# Patient Record
Sex: Female | Born: 1935 | Race: White | Hispanic: No
Health system: Midwestern US, Community
[De-identification: ages and names within clinical notes are randomized; demographics above are authoritative.]

## PROBLEM LIST (undated history)

## (undated) DIAGNOSIS — R519 Headache, unspecified: Secondary | ICD-10-CM

## (undated) DIAGNOSIS — W19XXXA Unspecified fall, initial encounter: Secondary | ICD-10-CM

## (undated) DIAGNOSIS — E7800 Pure hypercholesterolemia, unspecified: Secondary | ICD-10-CM

## (undated) DIAGNOSIS — K219 Gastro-esophageal reflux disease without esophagitis: Secondary | ICD-10-CM

## (undated) DIAGNOSIS — H3530 Unspecified macular degeneration: Secondary | ICD-10-CM

## (undated) DIAGNOSIS — I10 Essential (primary) hypertension: Secondary | ICD-10-CM

## (undated) DIAGNOSIS — R296 Repeated falls: Secondary | ICD-10-CM

## (undated) DIAGNOSIS — M1990 Unspecified osteoarthritis, unspecified site: Secondary | ICD-10-CM

## (undated) DIAGNOSIS — M549 Dorsalgia, unspecified: Secondary | ICD-10-CM

## (undated) DIAGNOSIS — M810 Age-related osteoporosis without current pathological fracture: Secondary | ICD-10-CM

## (undated) DIAGNOSIS — M47812 Spondylosis without myelopathy or radiculopathy, cervical region: Secondary | ICD-10-CM

## (undated) DIAGNOSIS — I350 Nonrheumatic aortic (valve) stenosis: Secondary | ICD-10-CM

## (undated) DIAGNOSIS — R1310 Dysphagia, unspecified: Secondary | ICD-10-CM

## (undated) HISTORY — DX: Unspecified osteoarthritis, unspecified site: M19.90

## (undated) HISTORY — PX: BREAST SURGERY: SHX581

## (undated) HISTORY — DX: Gastro-esophageal reflux disease without esophagitis: K21.9

## (undated) HISTORY — PX: CHOLECYSTECTOMY: SHX55

## (undated) HISTORY — PX: BACK SURGERY: SHX140

## (undated) HISTORY — DX: Age-related osteoporosis without current pathological fracture: M81.0

## (undated) HISTORY — DX: Spondylosis without myelopathy or radiculopathy, cervical region: M47.812

---

## 2011-10-19 ENCOUNTER — Other Ambulatory Visit: Payer: Self-pay | Admitting: Family Medicine

## 2011-10-24 ENCOUNTER — Ambulatory Visit
Admission: RE | Admit: 2011-10-24 | Discharge: 2011-10-24 | Disposition: A | Discharge status: Home / Self Care | Payer: Medicare Other | Source: Ambulatory Visit | Attending: Family Medicine | Admitting: Family Medicine

## 2011-10-24 MED ORDER — IOHEXOL 300 MG/ML  SOLN
100.0000 mL | Freq: Once | INTRAMUSCULAR | Status: AC | PRN
  Administered 2011-10-24: 100 mL via INTRAVENOUS

## 2011-11-06 ENCOUNTER — Emergency Department (HOSPITAL_BASED_OUTPATIENT_CLINIC_OR_DEPARTMENT_OTHER): Payer: Medicare Other

## 2011-11-06 ENCOUNTER — Emergency Department (HOSPITAL_BASED_OUTPATIENT_CLINIC_OR_DEPARTMENT_OTHER)
Admission: EM | Admit: 2011-11-06 | Discharge: 2011-11-06 | Disposition: A | Discharge status: Home / Self Care | Payer: Medicare Other | Attending: Emergency Medicine | Admitting: Emergency Medicine

## 2011-11-06 ENCOUNTER — Encounter (HOSPITAL_BASED_OUTPATIENT_CLINIC_OR_DEPARTMENT_OTHER): Payer: Self-pay | Admitting: Family Medicine

## 2011-11-06 DIAGNOSIS — Z9089 Acquired absence of other organs: Secondary | ICD-10-CM | POA: Insufficient documentation

## 2011-11-06 DIAGNOSIS — Z79899 Other long term (current) drug therapy: Secondary | ICD-10-CM | POA: Insufficient documentation

## 2011-11-06 DIAGNOSIS — R1031 Right lower quadrant pain: Secondary | ICD-10-CM | POA: Insufficient documentation

## 2011-11-06 DIAGNOSIS — K5732 Diverticulitis of large intestine without perforation or abscess without bleeding: Secondary | ICD-10-CM | POA: Insufficient documentation

## 2011-11-06 DIAGNOSIS — I10 Essential (primary) hypertension: Secondary | ICD-10-CM | POA: Insufficient documentation

## 2011-11-06 DIAGNOSIS — K5792 Diverticulitis of intestine, part unspecified, without perforation or abscess without bleeding: Secondary | ICD-10-CM

## 2011-11-06 DIAGNOSIS — E7800 Pure hypercholesterolemia, unspecified: Secondary | ICD-10-CM | POA: Insufficient documentation

## 2011-11-06 HISTORY — DX: Dorsalgia, unspecified: M54.9

## 2011-11-06 HISTORY — DX: Unspecified macular degeneration: H35.30

## 2011-11-06 HISTORY — DX: Pure hypercholesterolemia, unspecified: E78.00

## 2011-11-06 HISTORY — DX: Essential (primary) hypertension: I10

## 2011-11-06 LAB — COMPREHENSIVE METABOLIC PANEL
Albumin: 3.6 g/dL (ref 3.5–5.2)
BUN: 13 mg/dL (ref 6–23)
Calcium: 9.4 mg/dL (ref 8.4–10.5)
Creatinine, Ser: 0.8 mg/dL (ref 0.50–1.10)
Total Bilirubin: 0.6 mg/dL (ref 0.3–1.2)
Total Protein: 7.1 g/dL (ref 6.0–8.3)

## 2011-11-06 LAB — DIFFERENTIAL
Basophils Relative: 0 % (ref 0–1)
Eosinophils Absolute: 0 10*3/uL (ref 0.0–0.7)
Eosinophils Relative: 0 % (ref 0–5)
Monocytes Absolute: 1.2 10*3/uL — ABNORMAL HIGH (ref 0.1–1.0)
Monocytes Relative: 8 % (ref 3–12)

## 2011-11-06 LAB — CBC
HCT: 39.7 % (ref 36.0–46.0)
Hemoglobin: 13.5 g/dL (ref 12.0–15.0)
MCH: 31 pg (ref 26.0–34.0)
MCHC: 34 g/dL (ref 30.0–36.0)

## 2011-11-06 MED ORDER — SODIUM CHLORIDE 0.9 % IV BOLUS (SEPSIS)
500.0000 mL | Freq: Once | INTRAVENOUS | Status: AC
  Administered 2011-11-06: 1000 mL via INTRAVENOUS

## 2011-11-06 MED ORDER — CIPROFLOXACIN HCL 500 MG PO TABS: 500.0000 mg | ORAL_TABLET | Freq: Two times a day (BID) | ORAL | Status: AC

## 2011-11-06 MED ORDER — SODIUM CHLORIDE 0.9 % IV SOLN: INTRAVENOUS | Status: DC

## 2011-11-06 MED ORDER — OXYCODONE-ACETAMINOPHEN 5-325 MG PO TABS: 2.0000 | ORAL_TABLET | Freq: Four times a day (QID) | ORAL | Status: AC | PRN

## 2011-11-06 MED ORDER — METRONIDAZOLE 500 MG PO TABS: 500.0000 mg | ORAL_TABLET | Freq: Two times a day (BID) | ORAL | Status: AC

## 2011-11-06 MED ORDER — HYDROMORPHONE HCL PF 1 MG/ML IJ SOLN
0.5000 mg | Freq: Once | INTRAMUSCULAR | Status: AC
  Administered 2011-11-06: 0.5 mg via INTRAVENOUS
  Filled 2011-11-06: qty 1

## 2011-11-06 NOTE — ED Notes (Signed)
Pt c/o RLQ pain intermittent for 2 weeks and constant today. Pt was seen by Dr. Hyacinth Meeker, PCP for same and had negative CT scan. Pt went to Urgent care today and had negative urinalysis but elevated WBC. Pt sts last bowel movement 2 days ago, denies nausea, vomiting.

## 2011-11-06 NOTE — ED Provider Notes (Signed)
History     CSN: [id number]  Arrival date & time 11/06/11  1204   First MD Initiated Contact with Patient 11/06/11 1359      Chief Complaint  Patient presents with  . Abdominal Pain   PCP [facility] (Consider location/radiation/quality/duration/timing/severity/associated sxs/prior treatment) HPI This 78 year old female has had intermittent right lower quadrant abdominal pain lasting several minutes at a time over the last 2 weeks until today when she developed constant pain for the last several hours. She is no nausea vomiting or diarrhea. She last had a hard bowel movement 2 days ago. She has no chest pain cough shortness of breath or upper abdominal pain. She has no back pain. She is no radiation of the pain. The pain is constant today it is also coming in waves of colicky pain at times as well. It is worse with position changes as well. She has no pain in her extremities no weakness or numbness. She is no change in bowel or bladder function. She had a CT scan which is unremarkable according to the patient and family report about a week and a half ago. She was sent here today from an urgent care where a urinalysis was negative and she had a CBC showing an elevated white count of 13,000. She has a followup appointment tomorrow with her doctor. She has not eaten food today but did have a small amount of water this morning. Her pain is moderately severe there was no treatment of the pain prior to arrival. Past Medical History  Diagnosis Date  . Hypertension   . High cholesterol   . Back pain   . Macular degeneration     Past Surgical History  Procedure Date  . Cholecystectomy   . Back surgery   . Breast surgery     No family history on file.  History  Substance Use Topics  . Smoking status: Never Smoker   . Smokeless tobacco: Not on file  . Alcohol Use: No    OB History    Grav Para Term Preterm Abortions TAB SAB Ect Mult Living                  Review of  Systems  Constitutional: Negative for fever.       10 Systems reviewed and are negative for acute change except as noted in the HPI.  HENT: Negative for congestion.   Eyes: Negative for discharge and redness.  Respiratory: Negative for cough and shortness of breath.   Cardiovascular: Negative for chest pain.  Gastrointestinal: Positive for abdominal pain and constipation. Negative for nausea, vomiting, diarrhea and blood in stool.  Genitourinary: Negative for dysuria.  Musculoskeletal: Negative for back pain.  Skin: Negative for rash.  Neurological: Negative for syncope, numbness and headaches.  Psychiatric/Behavioral:       No behavior change.    Allergies  Codeine  Home Medications   Current Outpatient Rx  Name Route Sig Dispense Refill  . AMITRIPTYLINE HCL 50 MG PO TABS Oral Take 50 mg by mouth at bedtime.    Marland Kitchen AMLODIPINE BESYLATE 5 MG PO TABS Oral Take 5 mg by mouth daily.    . ATORVASTATIN CALCIUM 40 MG PO TABS Oral Take 40 mg by mouth daily.    Marland Kitchen LANSOPRAZOLE 30 MG PO CPDR Oral Take 30 mg by mouth daily.    Marland Kitchen LISINOPRIL 40 MG PO TABS Oral Take 40 mg by mouth daily.    Marland Kitchen CIPROFLOXACIN HCL 500 MG PO  TABS Oral Take 1 tablet (500 mg total) by mouth 2 (two) times daily. One po bid x 7 days 14 tablet 0  . METRONIDAZOLE 500 MG PO TABS Oral Take 1 tablet (500 mg total) by mouth 2 (two) times daily. One po bid x 7 days 14 tablet 0  . OXYCODONE-ACETAMINOPHEN 5-325 MG PO TABS Oral Take 2 tablets by mouth every 6 (six) hours as needed for pain. 20 tablet 0    BP 133/70  Pulse 98  Temp(Src) 98.2 F (36.8 C) (Oral)  Resp 16  Ht 5' (1.524 m)  Wt 123 lb (55.792 kg)  BMI 24.02 kg/m2  SpO2 100%  Physical Exam  Nursing note and vitals reviewed. Constitutional:       Awake, alert, nontoxic appearance.  HENT:  Head: Atraumatic.  Eyes: Right eye exhibits no discharge. Left eye exhibits no discharge.  Neck: Neck supple.  Cardiovascular: Normal rate and regular rhythm.   Murmur  heard. Pulmonary/Chest: Effort normal and breath sounds normal. No respiratory distress. She has no wheezes. She has no rales. She exhibits no tenderness.  Abdominal: Soft. Bowel sounds are normal. She exhibits no mass. There is tenderness. There is guarding. There is no rebound.       There is moderate right lower quadrant tenderness with questionable if any localized rebound tenderness without diffuse rebound or generalized peritoneal findings, there is no percussion tenderness even over the RLQ.  Musculoskeletal: She exhibits no edema and no tenderness.       Baseline ROM, no obvious new focal weakness. Both legs are nontender with normal light touch no edema she has dorsalis pedis pulses intact bilaterally with capillary refill less than 2 seconds in her toes normal light touch to her feet and good movement of her feet and legs.  Neurological:       Mental status and motor strength appears baseline for patient and situation.  Skin: No rash noted.  Psychiatric: She has a normal mood and affect.    ED Course  Procedures (including critical care time)  Labs Reviewed  CBC - Abnormal; Notable for the following:    WBC 14.4 (*)    All other components within normal limits  DIFFERENTIAL - Abnormal; Notable for the following:    Neutrophils Relative 85 (*)    Neutro Abs 12.3 (*)    Lymphocytes Relative 7 (*)    Monocytes Absolute 1.2 (*)    All other components within normal limits  COMPREHENSIVE METABOLIC PANEL - Abnormal; Notable for the following:    Glucose, Bld 115 (*)    GFR calc non Af Amer 62 (*)    GFR calc Af Amer 72 (*)    All other components within normal limits   No results found.   1. Diverticulitis       MDM   Pt stable in ED with no significant deterioration in condition.Patient / Family / Caregiver informed of clinical course, understand medical decision-making process, and agree with plan.I doubt any other EMC precluding discharge at this time including, but  not necessarily limited to the following:sepsis, peritonitis.       Hurman Horn, MD 11/13/11 2224

## 2011-11-06 NOTE — Discharge Instructions (Signed)
Diverticulitis Small pockets or "bubbles" can develop in the wall of the intestine. Diverticulitis is when those pockets become infected and inflamed. This causes stomach pain (usually on the left side). HOME CARE  Take all medicine as told by your doctor.   Try a clear liquid diet (broth, tea, or water) for as long as told by your doctor.   Keep all follow-up visits with your doctor.   You may be put on a low-fiber diet once you start feeling better. Here are foods that have low-fiber:   White breads, cereals, rice, and pasta.   Cooked fruits and vegetables or soft fresh fruits and vegetables without the skin.   Ground or well-cooked tender beef, ham, veal, lamb, pork, or poultry.   Eggs and seafood.   After you are doing well on the low-fiber diet, you may be put on a high-fiber diet. Here are ways to increase your fiber:   Choose whole-grain breads, cereals, pasta, and brown rice.   Choose fruits and vegetables with skin on. Do not overcook the vegetables.   Choose nuts, seeds, legumes, dried peas, beans, and lentils.   Look for food products that have more than 3 grams of fiber per serving on the food label.  GET HELP RIGHT AWAY IF:  Your pain does not get better or gets worse.   You have trouble eating food.   You are not pooping (having bowel movements) like normal.   You have a temperature by mouth above 101 F.  You keep throwing up (vomiting).   You have bloody or black, tarry poop (stools).  Return sooner also if you develop confusion, rash, shortness of breath, dizziness, severe uncontrolled pain, or other concerns.   You are getting worse and not better.  MAKE SURE YOU:   Understand these instructions.   Will watch your condition.   Will get help right away if you are not doing well or get worse.  Document Released: 11/22/2007 Document Revised: 05/25/2011 Document Reviewed: 04/26/2009 [facility] Patient Information 2012 [place], [place].

## 2012-03-12 ENCOUNTER — Ambulatory Visit: Payer: Medicare Other | Admitting: Occupational Therapy

## 2012-09-23 ENCOUNTER — Emergency Department (HOSPITAL_COMMUNITY): Payer: Medicare Other

## 2012-09-23 ENCOUNTER — Emergency Department (HOSPITAL_COMMUNITY)
Admission: EM | Admit: 2012-09-23 | Discharge: 2012-09-23 | Disposition: A | Discharge status: Home / Self Care | Payer: Medicare Other | Attending: Emergency Medicine | Admitting: Emergency Medicine

## 2012-09-23 DIAGNOSIS — E041 Nontoxic single thyroid nodule: Secondary | ICD-10-CM

## 2012-09-23 DIAGNOSIS — Z8739 Personal history of other diseases of the musculoskeletal system and connective tissue: Secondary | ICD-10-CM | POA: Insufficient documentation

## 2012-09-23 DIAGNOSIS — S41112A Laceration without foreign body of left upper arm, initial encounter: Secondary | ICD-10-CM

## 2012-09-23 DIAGNOSIS — R42 Dizziness and giddiness: Secondary | ICD-10-CM | POA: Insufficient documentation

## 2012-09-23 DIAGNOSIS — S41109A Unspecified open wound of unspecified upper arm, initial encounter: Secondary | ICD-10-CM | POA: Insufficient documentation

## 2012-09-23 DIAGNOSIS — I10 Essential (primary) hypertension: Secondary | ICD-10-CM | POA: Insufficient documentation

## 2012-09-23 DIAGNOSIS — Z8669 Personal history of other diseases of the nervous system and sense organs: Secondary | ICD-10-CM | POA: Insufficient documentation

## 2012-09-23 DIAGNOSIS — S51811D Laceration without foreign body of right forearm, subsequent encounter: Secondary | ICD-10-CM

## 2012-09-23 DIAGNOSIS — E7800 Pure hypercholesterolemia, unspecified: Secondary | ICD-10-CM | POA: Insufficient documentation

## 2012-09-23 DIAGNOSIS — R296 Repeated falls: Secondary | ICD-10-CM | POA: Insufficient documentation

## 2012-09-23 DIAGNOSIS — Z79899 Other long term (current) drug therapy: Secondary | ICD-10-CM | POA: Insufficient documentation

## 2012-09-23 DIAGNOSIS — Y9389 Activity, other specified: Secondary | ICD-10-CM | POA: Insufficient documentation

## 2012-09-23 DIAGNOSIS — S300XXA Contusion of lower back and pelvis, initial encounter: Secondary | ICD-10-CM

## 2012-09-23 DIAGNOSIS — S51809A Unspecified open wound of unspecified forearm, initial encounter: Secondary | ICD-10-CM | POA: Insufficient documentation

## 2012-09-23 DIAGNOSIS — W19XXXA Unspecified fall, initial encounter: Secondary | ICD-10-CM

## 2012-09-23 DIAGNOSIS — Y92009 Unspecified place in unspecified non-institutional (private) residence as the place of occurrence of the external cause: Secondary | ICD-10-CM | POA: Insufficient documentation

## 2012-09-23 DIAGNOSIS — Z23 Encounter for immunization: Secondary | ICD-10-CM | POA: Insufficient documentation

## 2012-09-23 DIAGNOSIS — Z9889 Other specified postprocedural states: Secondary | ICD-10-CM | POA: Insufficient documentation

## 2012-09-23 MED ORDER — TETANUS-DIPHTH-ACELL PERTUSSIS 5-2.5-18.5 LF-MCG/0.5 IM SUSP
0.5000 mL | Freq: Once | INTRAMUSCULAR | Status: AC
  Administered 2012-09-23: 0.5 mL via INTRAMUSCULAR
  Filled 2012-09-23: qty 0.5

## 2012-09-23 MED ORDER — OXYCODONE-ACETAMINOPHEN 5-325 MG PO TABS
1.0000 | ORAL_TABLET | Freq: Once | ORAL | Status: AC
  Administered 2012-09-23: 1 via ORAL
  Filled 2012-09-23: qty 1

## 2012-09-23 MED ORDER — OXYCODONE-ACETAMINOPHEN 5-325 MG PO TABS: 1.0000 | ORAL_TABLET | ORAL | Status: DC | PRN

## 2012-09-23 NOTE — ED Notes (Signed)
Pt still in radiology.

## 2012-09-23 NOTE — ED Provider Notes (Signed)
History     CSN: [id number]  Arrival date & time 09/23/12  1308   First MD Initiated Contact with Patient 09/23/12 1317      Chief Complaint  Patient presents with  . Fall    (Consider location/radiation/quality/duration/timing/severity/associated sxs/prior treatment) Patient is a 79 y.o. female presenting with fall. The history is provided by the patient.  Fall  She into her closet when she started to feel dizzy and fell. She has recently been treated for vertigo and was given medication to take on an as-needed basis. When she fell this time, she suffered lacerations to her right arm and bruised her tailbone. She denies loss of consciousness and denies headache. She rates the pain in her tailbone at 7/10. It is worse if she is sitting in certain positions. EMS placed her in a KED and brought her to the ED. She does not know when her last tetanus immunization was.  Past Medical History  Diagnosis Date  . Hypertension   . High cholesterol   . Back pain   . Macular degeneration     Past Surgical History  Procedure Laterality Date  . Cholecystectomy    . Back surgery    . Breast surgery      No family history on file.  History  Substance Use Topics  . Smoking status: Never Smoker   . Smokeless tobacco: Not on file  . Alcohol Use: No    OB History   Grav Para Term Preterm Abortions TAB SAB Ect Mult Living                  Review of Systems  All other systems reviewed and are negative.    Allergies  Codeine and Nsaids  Home Medications   Current Outpatient Rx  Name  Route  Sig  Dispense  Refill  . acetaminophen (TYLENOL) 650 MG CR tablet   Oral   Take 650 mg by mouth every 6 (six) hours as needed for pain.         Marland Kitchen alendronate (FOSAMAX) 70 MG tablet   Oral   Take 70 mg by mouth every 7 (seven) days. Take with a full glass of water on an empty stomach.         Marland Kitchen amitriptyline (ELAVIL) 50 MG tablet   Oral   Take 50 mg by mouth at bedtime.          Marland Kitchen amLODipine (NORVASC) 5 MG tablet   Oral   Take 5 mg by mouth daily.         Marland Kitchen atorvastatin (LIPITOR) 40 MG tablet   Oral   Take 40 mg by mouth daily.         . cholecalciferol (VITAMIN D) 1000 UNITS tablet   Oral   Take 1,000 Units by mouth daily.         . lansoprazole (PREVACID) 30 MG capsule   Oral   Take 30 mg by mouth daily.         Marland Kitchen lisinopril (PRINIVIL,ZESTRIL) 40 MG tablet   Oral   Take 40 mg by mouth daily.         . Multiple Vitamin (MULTIVITAMIN WITH MINERALS) TABS   Oral   Take 1 tablet by mouth daily.           BP 145/58  Pulse 101  Temp(Src) 98 F (36.7 C) (Oral)  Resp 18  SpO2 95%  Physical Exam  Nursing note and vitals reviewed.  79 year  old female, resting comfortably and in no acute distress. She is immobilized with a stiff cervical collar andKED.  Vital signs are significant for borderline tachycardia with heart rate 101, and mild hypertension with blood pressure 145/58. Oxygen saturation is 95%, which is normal. Head is normocephalic and atraumatic. PERRLA, EOMI. Oropharynx is clear. Neck is nontender without adenopathy or JVD. Backhas mild tenderness over the sacral area. No tenderness in the lumbar or thoracic regions. Lungs are clear without rales, wheezes, or rhonchi. Chest is nontender. Heart has regular rate and rhythm without murmur. Abdomen is soft, flat, nontender without masses or hepatosplenomegaly and peristalsis is normoactive. Extremities have no cyanosis or edema, full range of motion is present.Skin tears are present in the right upper arm and forearm.  Skin is warm and dry without other rash. Neurologic: Mental status is normal, cranial nerves are intact, there are no motor or sensory deficits.  ED Course  Procedures (including critical care time)  Labs Reviewed - No data to display Dg Pelvis 1-2 Views  09/23/2012  *RADIOLOGY REPORT*  Clinical Data: Larey Seat.  Pain.  PELVIS - 1-2 VIEW  Comparison:04/10/2012   Findings:  No evidence of pelvic fracture.  There is mild osteoarthritis of the hip joints.  Chronic benign-appearing sclerotic focus of the left iliac bone is unchanged.  There has been previous lumbar laminectomy.  IMPRESSION: No acute or traumatic finding.   Original Report Authenticated By: Paulina Fusi, M.D.    Dg Sacrum/coccyx  09/23/2012  *RADIOLOGY REPORT*  Clinical Data: Larey Seat.  Pain.  SACRUM AND COCCYX - 2+ VIEW  Comparison: CT 11/06/2011  Findings: No evidence of sacral or coccygeal fracture.  No other focal finding.  IMPRESSION: Negative radiographs   Original Report Authenticated By: Paulina Fusi, M.D.    Ct Head Wo Contrast  09/23/2012  *RADIOLOGY REPORT*  Clinical Data:  Status post fall.  CT HEAD WITHOUT CONTRAST CT CERVICAL SPINE WITHOUT CONTRAST  Technique:  Multidetector CT imaging of the head and cervical spine was performed following the standard protocol without intravenous contrast.  Multiplanar CT image reconstructions of the cervical spine were also generated.  Comparison:   None  CT HEAD  Findings: There is no evidence of acute intracranial hemorrhage, mass effect, brain edema or extra-axial fluid collection.  The ventricles and subarachnoid spaces are diffusely prominent consistent with mild atrophy.  No acute cortical infarction is seen.  There is moderate periventricular and subcortical white matter disease, likely chronic small vessel ischemic change.  The visualized paranasal sinuses are clear. The calvarium is intact.  IMPRESSION: No acute intracranial or calvarial findings.  Moderate periventricular and subcortical white matter disease, likely small vessel ischemic change.  CT CERVICAL SPINE  Findings: There is a convex left cervical scoliosis associated with multilevel spondylosis.  There is no evidence of acute fracture or traumatic subluxation.  Facet degenerative changes are present throughout the cervical spine.  No acute soft tissue findings are identified.  However, there is  an asymmetric right thyroid nodule measuring up to 4.3 x 3.0 cm transverse and 4.7 cm cephalocaudad.  This results in tracheal deviation to the left.  The degree of tracheal deviation is similar to chest radiographs obtained last year.  Scattered vascular calcifications are noted.  The lung apices are clear.  IMPRESSION:  1.  No evidence of acute cervical spine fracture, traumatic subluxation or static signs instability. 2.  Multilevel facet disease, primarily involving the facet joints. 3.  Right thyroid mass. If this is not a known clinical  finding, further evaluation with thyroid ultrasound should be considered. This may not be clinically significant in this 79 year old.   Original Report Authenticated By: Carey Bullocks, M.D.    Ct Cervical Spine Wo Contrast  09/23/2012  *RADIOLOGY REPORT*  Clinical Data:  Status post fall.  CT HEAD WITHOUT CONTRAST CT CERVICAL SPINE WITHOUT CONTRAST  Technique:  Multidetector CT imaging of the head and cervical spine was performed following the standard protocol without intravenous contrast.  Multiplanar CT image reconstructions of the cervical spine were also generated.  Comparison:   None  CT HEAD  Findings: There is no evidence of acute intracranial hemorrhage, mass effect, brain edema or extra-axial fluid collection.  The ventricles and subarachnoid spaces are diffusely prominent consistent with mild atrophy.  No acute cortical infarction is seen.  There is moderate periventricular and subcortical white matter disease, likely chronic small vessel ischemic change.  The visualized paranasal sinuses are clear. The calvarium is intact.  IMPRESSION: No acute intracranial or calvarial findings.  Moderate periventricular and subcortical white matter disease, likely small vessel ischemic change.  CT CERVICAL SPINE  Findings: There is a convex left cervical scoliosis associated with multilevel spondylosis.  There is no evidence of acute fracture or traumatic subluxation.  Facet  degenerative changes are present throughout the cervical spine.  No acute soft tissue findings are identified.  However, there is an asymmetric right thyroid nodule measuring up to 4.3 x 3.0 cm transverse and 4.7 cm cephalocaudad.  This results in tracheal deviation to the left.  The degree of tracheal deviation is similar to chest radiographs obtained last year.  Scattered vascular calcifications are noted.  The lung apices are clear.  IMPRESSION:  1.  No evidence of acute cervical spine fracture, traumatic subluxation or static signs instability. 2.  Multilevel facet disease, primarily involving the facet joints. 3.  Right thyroid mass. If this is not a known clinical finding, further evaluation with thyroid ultrasound should be considered. This may not be clinically significant in this 79 year old.   Original Report Authenticated By: Carey Bullocks, M.D.      1. Fall at home, initial encounter   2. Contusion of sacrococcygeal region, initial encounter   3. Skin tear of forearm without complication, right, subsequent encounter   4. Skin tear of upper arm without complication, left, initial encounter   5. Thyroid nodule       MDM  4 with injury to sacrum and coccyx region and skin tears. Underlying vertigo may contributed to the fall. She will be sent for CT of head and cervical spine as well as x-rays of her sacrum and coccyx. TdAP booster is given. Skin tears will need to be treated with simple dressing.  X-rays and CT scans are significant only for a thyroid nodule. I have explained this to the patient and she thinks that it has been in and evaluated in the past with decision made not to do any kind of intervention. She's advised to double check with her PCP to make sure that it has been adequately evaluated in the past. She is sent home with instructions on how to take care of skin tears and contusions and prescriptions given for Percocet for pain.     Dione Booze, MD 09/23/12 [phone]

## 2012-09-23 NOTE — ED Notes (Signed)
Per report from [facility] pt is a resident at [facility] independent living.  Pt was attempting to turn out of the closet and tripped.  The fall resulted in Low back pain.  CMS is intact.  KED and C-collar is in place.  No distress noted.  Resp symmetrical and unlabored.  Skin warm and dry.  A/ox 4 denies LOC.

## 2012-09-29 ENCOUNTER — Encounter (HOSPITAL_COMMUNITY): Payer: Self-pay | Admitting: Emergency Medicine

## 2012-09-29 ENCOUNTER — Emergency Department (HOSPITAL_COMMUNITY)
Admission: EM | Admit: 2012-09-29 | Discharge: 2012-09-30 | Disposition: A | Discharge status: Home / Self Care | Payer: Medicare Other | Attending: Emergency Medicine | Admitting: Emergency Medicine

## 2012-09-29 DIAGNOSIS — M533 Sacrococcygeal disorders, not elsewhere classified: Secondary | ICD-10-CM | POA: Insufficient documentation

## 2012-09-29 DIAGNOSIS — N949 Unspecified condition associated with female genital organs and menstrual cycle: Secondary | ICD-10-CM | POA: Insufficient documentation

## 2012-09-29 DIAGNOSIS — R0602 Shortness of breath: Secondary | ICD-10-CM | POA: Insufficient documentation

## 2012-09-29 DIAGNOSIS — Z79899 Other long term (current) drug therapy: Secondary | ICD-10-CM | POA: Insufficient documentation

## 2012-09-29 DIAGNOSIS — Z8739 Personal history of other diseases of the musculoskeletal system and connective tissue: Secondary | ICD-10-CM | POA: Insufficient documentation

## 2012-09-29 DIAGNOSIS — I10 Essential (primary) hypertension: Secondary | ICD-10-CM | POA: Insufficient documentation

## 2012-09-29 DIAGNOSIS — E7800 Pure hypercholesterolemia, unspecified: Secondary | ICD-10-CM | POA: Insufficient documentation

## 2012-09-29 DIAGNOSIS — H539 Unspecified visual disturbance: Secondary | ICD-10-CM | POA: Insufficient documentation

## 2012-09-29 DIAGNOSIS — K5900 Constipation, unspecified: Secondary | ICD-10-CM | POA: Insufficient documentation

## 2012-09-29 DIAGNOSIS — R110 Nausea: Secondary | ICD-10-CM | POA: Insufficient documentation

## 2012-09-29 DIAGNOSIS — M549 Dorsalgia, unspecified: Secondary | ICD-10-CM | POA: Insufficient documentation

## 2012-09-29 MED ORDER — ONDANSETRON 4 MG PO TBDP
4.0000 mg | ORAL_TABLET | Freq: Once | ORAL | Status: AC
  Administered 2012-09-29: 4 mg via ORAL
  Filled 2012-09-29: qty 1

## 2012-09-29 MED ORDER — HYDROCODONE-ACETAMINOPHEN 5-325 MG PO TABS
1.0000 | ORAL_TABLET | Freq: Once | ORAL | Status: AC
  Administered 2012-09-29: 1 via ORAL
  Filled 2012-09-29: qty 1

## 2012-09-29 MED ORDER — HYDROCODONE-ACETAMINOPHEN 5-325 MG PO TABS: 1.0000 | ORAL_TABLET | Freq: Four times a day (QID) | ORAL | Status: DC | PRN

## 2012-09-29 MED ORDER — ONDANSETRON 4 MG PO TBDP: 4.0000 mg | ORAL_TABLET | Freq: Three times a day (TID) | ORAL | Status: DC | PRN

## 2012-09-29 NOTE — ED Notes (Signed)
Per EMS pt came from [facility] assisted [facility] facility c/o SOB. RR 24 bpm. HR 104 sinus tach. Pt has hx of anxiety. Pt was SOB yesterday due to anxiety but it went away. SpO2 98% 2LNC. bp- 150/92.

## 2012-09-29 NOTE — ED Notes (Signed)
Pt appears very anxious at this time. Denies hx of anxiety but states she had the same thing yesterday but was not "scared" like she is now.

## 2012-09-29 NOTE — ED Notes (Signed)
Pt was recently seen at [facility] for fall where she hurt her sacrum. Pt is taking percocet at home for pain.  Pt has been less active at home. Has not had a BM since last Monday and has difficulty urinating and strains with voiding. Family is concerned she is not eating enough at home.

## 2012-09-29 NOTE — ED Provider Notes (Addendum)
History     CSN: [id number]  Arrival date & time 09/29/12  1191   First MD Initiated Contact with Patient 09/29/12 2059      Chief Complaint  Patient presents with  . Shortness of Breath    (Consider location/radiation/quality/duration/timing/severity/associated sxs/prior treatment) Patient is a 79 y.o. female presenting with shortness of breath. The history is provided by the EMS personnel, the patient and a relative.  Shortness of Breath Associated symptoms: no abdominal pain, no chest pain, no fever, no headaches, no neck pain and no vomiting    patient brought in by EMS from a senior living center. Complaint was shortness of breath. Patient has a history of anxiety shortness of breath had improved the upon arrival to the emergency department. The patient was seen April 7 following a fall with a tailbone injury. Workup at that time revealed no other significant injuries. Patient now feels much better. Family has concern about the Percocet pain medicine that she been on causing constipation and nausea and she gets confused with that.  Past Medical History  Diagnosis Date  . Hypertension   . High cholesterol   . Back pain   . Macular degeneration     Past Surgical History  Procedure Laterality Date  . Cholecystectomy    . Back surgery    . Breast surgery      No family history on file.  History  Substance Use Topics  . Smoking status: Never Smoker   . Smokeless tobacco: Not on file  . Alcohol Use: No    OB History   Grav Para Term Preterm Abortions TAB SAB Ect Mult Living                  Review of Systems  Constitutional: Negative for fever.  HENT: Negative for congestion and neck pain.   Eyes: Positive for visual disturbance.  Respiratory: Positive for shortness of breath.   Cardiovascular: Negative for chest pain.  Gastrointestinal: Positive for nausea and constipation. Negative for vomiting and abdominal pain.  Genitourinary: Positive for pelvic pain.  Negative for dysuria.  Musculoskeletal: Positive for back pain.  Neurological: Negative for headaches.  Hematological: Does not bruise/bleed easily.    Allergies  Codeine and Nsaids  Home Medications   Current Outpatient Rx  Name  Route  Sig  Dispense  Refill  . acetaminophen (TYLENOL) 650 MG CR tablet   Oral   Take 650 mg by mouth every 6 (six) hours as needed for pain.         Marland Kitchen alendronate (FOSAMAX) 70 MG tablet   Oral   Take 70 mg by mouth every 7 (seven) days. Take with a full glass of water on an empty stomach.         Marland Kitchen amitriptyline (ELAVIL) 50 MG tablet   Oral   Take 50 mg by mouth at bedtime.         Marland Kitchen amLODipine (NORVASC) 5 MG tablet   Oral   Take 5 mg by mouth daily.         Marland Kitchen atorvastatin (LIPITOR) 40 MG tablet   Oral   Take 40 mg by mouth daily.         . cholecalciferol (VITAMIN D) 1000 UNITS tablet   Oral   Take 1,000 Units by mouth daily.         . lansoprazole (PREVACID) 30 MG capsule   Oral   Take 30 mg by mouth daily.         Marland Kitchen  lisinopril (PRINIVIL,ZESTRIL) 40 MG tablet   Oral   Take 40 mg by mouth daily.         . Multiple Vitamin (MULTIVITAMIN WITH MINERALS) TABS   Oral   Take 1 tablet by mouth daily.         Marland Kitchen oxyCODONE-acetaminophen (PERCOCET/ROXICET) 5-325 MG per tablet   Oral   Take 1 tablet by mouth every 4 (four) hours as needed for pain.   20 tablet   0   . HYDROcodone-acetaminophen (NORCO/VICODIN) 5-325 MG per tablet   Oral   Take 1 tablet by mouth every 6 (six) hours as needed for pain.   20 tablet   0   . ondansetron (ZOFRAN ODT) 4 MG disintegrating tablet   Oral   Take 1 tablet (4 mg total) by mouth every 8 (eight) hours as needed for nausea.   10 tablet   1     BP 143/88  Pulse 93  Temp(Src) 98.5 F (36.9 C) (Oral)  Resp 22  SpO2 100%  Physical Exam  Nursing note and vitals reviewed. Constitutional: She is oriented to person, place, and time. She appears well-developed and well-nourished.  No distress.  HENT:  Head: Normocephalic and atraumatic.  Mouth/Throat: Oropharynx is clear and moist.  Eyes: Conjunctivae and EOM are normal. Pupils are equal, round, and reactive to light.  Neck: Normal range of motion. Neck supple.  Cardiovascular: Normal rate, regular rhythm and normal heart sounds.   No murmur heard. Pulmonary/Chest: Effort normal and breath sounds normal. No respiratory distress. She has no wheezes. She has no rales.  Abdominal: Soft. Bowel sounds are normal. There is no tenderness.  Musculoskeletal: Normal range of motion. She exhibits no edema.  Neurological: She is alert and oriented to person, place, and time. No cranial nerve deficit. She exhibits normal muscle tone. Coordination normal.  Skin: Skin is warm. No rash noted.    ED Course  Procedures (including critical care time)  Labs Reviewed - No data to display No results found.  Date: 09/29/2012  Rate: 99  Rhythm: normal sinus rhythm  QRS Axis: normal  Intervals: normal  ST/T Wave abnormalities: nonspecific ST/T changes  Conduction Disutrbances:none  Narrative Interpretation:   Old EKG Reviewed: none available Persistent artifact no evidence of junctional rhythm.   1. Shortness of breath   2. Tail bone pain       MDM  Patient stays at [facility] senior living. Patient was recently evaluated emergent deptment for injury to her tailbone. She has been on Percocet for pain. Family members as noted this is making her somewhat confused. Today she get very anxious and appeared to be short of breath when her sitter had fallen asleep and she can get her to wake up. Here in the emergency department patient's room air saturations have been mid 90s and above. No further tachypnea or shortness of breath. Heart rate is also below 100. Seems to be related to an anxiety component. Discussion with family there was some concern about constipation as well this would be related to the pain medicines patient's had  some mild nausea. No  vomiting. In discussion with family they did not want an extensive workup. We decided to change her to hydrocodone from the Percocet provided Zofran they will stay with her tonight if there is any additional the breathing issues he will return however clinically and on examination lungs clear patient's no acute distress nontoxic. For the fall that occurred on April 7 patient had an extensive workup  without any evidence of hip fractures. Also head CT was negative.  Shelda Jakes, MD 09/29/12 [id number]  Shelda Jakes, MD 09/29/12 [phone]

## 2012-09-30 NOTE — ED Notes (Addendum)
Pt dc'd home w/all belongings, alert upon dc, pt escorted out in a wheel chair d/t pain w/ambulating, pt driven home by two daughters, family verbalizes understanding of dc instructions.

## 2012-10-14 ENCOUNTER — Other Ambulatory Visit: Payer: Self-pay | Admitting: Family Medicine

## 2012-10-14 DIAGNOSIS — IMO0002 Reserved for concepts with insufficient information to code with codable children: Secondary | ICD-10-CM

## 2012-10-17 ENCOUNTER — Ambulatory Visit
Admission: RE | Admit: 2012-10-17 | Discharge: 2012-10-17 | Disposition: A | Discharge status: Home / Self Care | Payer: Medicare Other | Source: Ambulatory Visit | Attending: Family Medicine | Admitting: Family Medicine

## 2012-10-17 DIAGNOSIS — IMO0002 Reserved for concepts with insufficient information to code with codable children: Secondary | ICD-10-CM

## 2012-10-31 ENCOUNTER — Ambulatory Visit (HOSPITAL_COMMUNITY)
Admission: RE | Admit: 2012-10-31 | Discharge: 2012-10-31 | Disposition: A | Discharge status: Home / Self Care | Payer: Medicare Other | Source: Ambulatory Visit | Attending: Family Medicine | Admitting: Family Medicine

## 2012-10-31 ENCOUNTER — Other Ambulatory Visit (HOSPITAL_COMMUNITY): Payer: Self-pay | Admitting: Family Medicine

## 2012-10-31 DIAGNOSIS — IMO0002 Reserved for concepts with insufficient information to code with codable children: Secondary | ICD-10-CM

## 2012-11-01 ENCOUNTER — Other Ambulatory Visit (HOSPITAL_COMMUNITY): Payer: Self-pay | Admitting: Interventional Radiology

## 2012-11-01 ENCOUNTER — Other Ambulatory Visit: Payer: Self-pay | Admitting: Radiology

## 2012-11-01 DIAGNOSIS — IMO0002 Reserved for concepts with insufficient information to code with codable children: Secondary | ICD-10-CM

## 2012-11-04 ENCOUNTER — Encounter (HOSPITAL_COMMUNITY): Payer: Self-pay | Admitting: Pharmacy Technician

## 2012-11-05 ENCOUNTER — Ambulatory Visit (HOSPITAL_COMMUNITY)
Admission: RE | Admit: 2012-11-05 | Discharge: 2012-11-05 | Disposition: A | Discharge status: Home / Self Care | Payer: Medicare Other | Source: Ambulatory Visit | Attending: Interventional Radiology | Admitting: Interventional Radiology

## 2012-11-05 DIAGNOSIS — E785 Hyperlipidemia, unspecified: Secondary | ICD-10-CM | POA: Insufficient documentation

## 2012-11-05 DIAGNOSIS — S32009A Unspecified fracture of unspecified lumbar vertebra, initial encounter for closed fracture: Secondary | ICD-10-CM | POA: Insufficient documentation

## 2012-11-05 DIAGNOSIS — Z885 Allergy status to narcotic agent status: Secondary | ICD-10-CM | POA: Insufficient documentation

## 2012-11-05 DIAGNOSIS — H3530 Unspecified macular degeneration: Secondary | ICD-10-CM | POA: Insufficient documentation

## 2012-11-05 DIAGNOSIS — I10 Essential (primary) hypertension: Secondary | ICD-10-CM | POA: Insufficient documentation

## 2012-11-05 DIAGNOSIS — W19XXXA Unspecified fall, initial encounter: Secondary | ICD-10-CM | POA: Insufficient documentation

## 2012-11-05 DIAGNOSIS — IMO0002 Reserved for concepts with insufficient information to code with codable children: Secondary | ICD-10-CM

## 2012-11-05 LAB — CBC WITH DIFFERENTIAL/PLATELET
Basophils Absolute: 0 10*3/uL (ref 0.0–0.1)
Basophils Relative: 0 % (ref 0–1)
Eosinophils Absolute: 0.1 10*3/uL (ref 0.0–0.7)
MCH: 31 pg (ref 26.0–34.0)
MCHC: 33.4 g/dL (ref 30.0–36.0)
Neutrophils Relative %: 76 % (ref 43–77)
Platelets: 217 10*3/uL (ref 150–400)
RBC: 4.36 MIL/uL (ref 3.87–5.11)
RDW: 13.3 % (ref 11.5–15.5)

## 2012-11-05 LAB — BASIC METABOLIC PANEL
BUN: 9 mg/dL (ref 6–23)
Calcium: 9.6 mg/dL (ref 8.4–10.5)
Creatinine, Ser: 0.78 mg/dL (ref 0.50–1.10)
GFR calc non Af Amer: 70 mL/min — ABNORMAL LOW (ref >90)
Glucose, Bld: 106 mg/dL — ABNORMAL HIGH (ref 70–99)
Sodium: 139 mEq/L (ref 135–145)

## 2012-11-05 LAB — PROTIME-INR
INR: 1.07 (ref 0.00–1.49)
Prothrombin Time: 13.8 seconds (ref 11.6–15.2)

## 2012-11-05 MED ORDER — TOBRAMYCIN SULFATE 1.2 G IJ SOLR
INTRAMUSCULAR | Status: AC
  Filled 2012-11-05: qty 1.2

## 2012-11-05 MED ORDER — FENTANYL CITRATE 0.05 MG/ML IJ SOLN
INTRAMUSCULAR | Status: DC | PRN
  Administered 2012-11-05: 25 ug via INTRAVENOUS

## 2012-11-05 MED ORDER — SODIUM CHLORIDE 0.9 % IV SOLN: INTRAVENOUS | Status: DC

## 2012-11-05 MED ORDER — CEFAZOLIN SODIUM-DEXTROSE 2-3 GM-% IV SOLR
2.0000 g | Freq: Once | INTRAVENOUS | Status: AC
  Administered 2012-11-05: 2 g via INTRAVENOUS

## 2012-11-05 MED ORDER — MIDAZOLAM HCL 2 MG/2ML IJ SOLN
INTRAMUSCULAR | Status: AC
  Filled 2012-11-05: qty 4

## 2012-11-05 MED ORDER — CEFAZOLIN SODIUM-DEXTROSE 2-3 GM-% IV SOLR
INTRAVENOUS | Status: AC
  Filled 2012-11-05: qty 50

## 2012-11-05 MED ORDER — MIDAZOLAM HCL 2 MG/2ML IJ SOLN
INTRAMUSCULAR | Status: DC | PRN
  Administered 2012-11-05: 1 mg via INTRAVENOUS

## 2012-11-05 MED ORDER — IOHEXOL 300 MG/ML  SOLN
50.0000 mL | Freq: Once | INTRAMUSCULAR | Status: AC | PRN
  Administered 2012-11-05: 10 mL

## 2012-11-05 MED ORDER — SODIUM CHLORIDE 0.9 % IV SOLN: INTRAVENOUS | Status: AC

## 2012-11-05 MED ORDER — FENTANYL CITRATE 0.05 MG/ML IJ SOLN
INTRAMUSCULAR | Status: AC
  Filled 2012-11-05: qty 4

## 2012-11-05 MED ORDER — HYDROMORPHONE HCL PF 1 MG/ML IJ SOLN
INTRAMUSCULAR | Status: AC
  Filled 2012-11-05: qty 1

## 2012-11-05 NOTE — H&P (Signed)
Alyssa Cummings is an 79 y.o. female.   Chief Complaint: Back pain-Lumbar #1 fx post fall 1 mo ago Scheduled for L1 vertebroplasty today HPI: HTN; HLD; mac degeneration  Past Medical History  Diagnosis Date  . Hypertension   . High cholesterol   . Back pain   . Macular degeneration     Past Surgical History  Procedure Laterality Date  . Cholecystectomy    . Back surgery    . Breast surgery      No family history on file. Social History:  reports that she has never smoked. She does not have any smokeless tobacco history on file. She reports that she does not drink alcohol or use illicit drugs.  Allergies:  Allergies  Allergen Reactions  . Codeine Nausea And Vomiting     (Not in a hospital admission)  No results found for this or any previous visit (from the past 48 hour(s)). No results found.  Review of Systems  Constitutional: Negative for fever.       Frail  HENT: Positive for hearing loss.   Respiratory: Negative for shortness of breath.   Cardiovascular: Negative for chest pain.  Gastrointestinal: Negative for nausea, vomiting and abdominal pain.  Musculoskeletal: Positive for back pain.  Neurological: Positive for weakness. Negative for headaches.    Blood pressure 154/79, pulse 92, temperature 96.9 F (36.1 C), temperature source Oral, resp. rate 18, height 5' (1.524 m), weight 110 lb (49.896 kg), SpO2 100.00%. Physical Exam  Constitutional: She is oriented to person, place, and time.  Cardiovascular: Normal rate and regular rhythm.   Murmur heard. Respiratory: Effort normal. She has wheezes.  GI: Soft. Bowel sounds are normal. There is no tenderness.  Musculoskeletal: Normal range of motion.  Back pain  Neurological: She is alert and oriented to person, place, and time.  Psychiatric: She has a normal mood and affect. Her behavior is normal. Judgment and thought content normal.     Assessment/Plan Lumbar #1 fx Scheduled for VP today Pt and dtr aware  of procedure benefits and risks and agreeable to proceed Consent signed and in chart  Sheron Robin A 11/05/2012, 9:13 AM

## 2012-11-05 NOTE — Procedures (Signed)
S/P L1 VP 

## 2012-11-06 ENCOUNTER — Telehealth (HOSPITAL_COMMUNITY): Payer: Self-pay | Admitting: *Deleted

## 2012-11-13 ENCOUNTER — Other Ambulatory Visit (HOSPITAL_COMMUNITY): Payer: Self-pay | Admitting: Interventional Radiology

## 2012-11-13 DIAGNOSIS — IMO0002 Reserved for concepts with insufficient information to code with codable children: Secondary | ICD-10-CM

## 2012-11-21 ENCOUNTER — Ambulatory Visit (HOSPITAL_COMMUNITY)
Admission: RE | Admit: 2012-11-21 | Discharge: 2012-11-21 | Disposition: A | Discharge status: Home / Self Care | Payer: Medicare Other | Source: Ambulatory Visit | Attending: Interventional Radiology | Admitting: Interventional Radiology

## 2012-11-21 DIAGNOSIS — IMO0002 Reserved for concepts with insufficient information to code with codable children: Secondary | ICD-10-CM

## 2012-11-22 ENCOUNTER — Telehealth (HOSPITAL_COMMUNITY): Payer: Self-pay | Admitting: *Deleted

## 2013-07-08 ENCOUNTER — Other Ambulatory Visit (HOSPITAL_COMMUNITY): Payer: Self-pay | Admitting: Family Medicine

## 2013-07-08 DIAGNOSIS — I350 Nonrheumatic aortic (valve) stenosis: Secondary | ICD-10-CM

## 2013-07-22 ENCOUNTER — Other Ambulatory Visit (HOSPITAL_COMMUNITY): Payer: Medicare Other

## 2013-07-22 ENCOUNTER — Ambulatory Visit (HOSPITAL_COMMUNITY)
Admission: RE | Admit: 2013-07-22 | Discharge: 2013-07-22 | Disposition: A | Discharge status: Home / Self Care | Payer: Medicare Other | Source: Ambulatory Visit | Attending: Family Medicine | Admitting: Family Medicine

## 2013-07-22 DIAGNOSIS — R0609 Other forms of dyspnea: Secondary | ICD-10-CM | POA: Insufficient documentation

## 2013-07-22 DIAGNOSIS — I079 Rheumatic tricuspid valve disease, unspecified: Secondary | ICD-10-CM | POA: Insufficient documentation

## 2013-07-22 DIAGNOSIS — R011 Cardiac murmur, unspecified: Secondary | ICD-10-CM | POA: Insufficient documentation

## 2013-07-22 DIAGNOSIS — I350 Nonrheumatic aortic (valve) stenosis: Secondary | ICD-10-CM

## 2013-07-22 DIAGNOSIS — I359 Nonrheumatic aortic valve disorder, unspecified: Secondary | ICD-10-CM

## 2013-07-22 DIAGNOSIS — R0989 Other specified symptoms and signs involving the circulatory and respiratory systems: Secondary | ICD-10-CM | POA: Insufficient documentation

## 2013-07-22 DIAGNOSIS — I080 Rheumatic disorders of both mitral and aortic valves: Secondary | ICD-10-CM | POA: Insufficient documentation

## 2013-07-22 NOTE — Progress Notes (Signed)
Echo Lab  2D Echocardiogram completed.  Redith Drach L Laina Guerrieri, RDCS 07/22/2013 4:31 PM

## 2014-03-03 ENCOUNTER — Emergency Department (HOSPITAL_COMMUNITY): Payer: Medicare Other

## 2014-03-03 ENCOUNTER — Emergency Department (HOSPITAL_COMMUNITY)
Admission: EM | Admit: 2014-03-03 | Discharge: 2014-03-03 | Disposition: A | Discharge status: Home / Self Care | Payer: Medicare Other | Attending: Emergency Medicine | Admitting: Emergency Medicine

## 2014-03-03 ENCOUNTER — Encounter (HOSPITAL_COMMUNITY): Payer: Self-pay | Admitting: Emergency Medicine

## 2014-03-03 DIAGNOSIS — M810 Age-related osteoporosis without current pathological fracture: Secondary | ICD-10-CM | POA: Diagnosis not present

## 2014-03-03 DIAGNOSIS — Y92009 Unspecified place in unspecified non-institutional (private) residence as the place of occurrence of the external cause: Secondary | ICD-10-CM | POA: Insufficient documentation

## 2014-03-03 DIAGNOSIS — R011 Cardiac murmur, unspecified: Secondary | ICD-10-CM | POA: Insufficient documentation

## 2014-03-03 DIAGNOSIS — W1809XA Striking against other object with subsequent fall, initial encounter: Secondary | ICD-10-CM | POA: Insufficient documentation

## 2014-03-03 DIAGNOSIS — Z8669 Personal history of other diseases of the nervous system and sense organs: Secondary | ICD-10-CM | POA: Diagnosis not present

## 2014-03-03 DIAGNOSIS — E7800 Pure hypercholesterolemia, unspecified: Secondary | ICD-10-CM | POA: Diagnosis not present

## 2014-03-03 DIAGNOSIS — S51809A Unspecified open wound of unspecified forearm, initial encounter: Secondary | ICD-10-CM | POA: Insufficient documentation

## 2014-03-03 DIAGNOSIS — Z79899 Other long term (current) drug therapy: Secondary | ICD-10-CM | POA: Diagnosis not present

## 2014-03-03 DIAGNOSIS — M79632 Pain in left forearm: Secondary | ICD-10-CM

## 2014-03-03 DIAGNOSIS — K219 Gastro-esophageal reflux disease without esophagitis: Secondary | ICD-10-CM | POA: Insufficient documentation

## 2014-03-03 DIAGNOSIS — M1990 Unspecified osteoarthritis, unspecified site: Secondary | ICD-10-CM | POA: Diagnosis not present

## 2014-03-03 DIAGNOSIS — S1093XA Contusion of unspecified part of neck, initial encounter: Secondary | ICD-10-CM | POA: Diagnosis not present

## 2014-03-03 DIAGNOSIS — Y9389 Activity, other specified: Secondary | ICD-10-CM | POA: Insufficient documentation

## 2014-03-03 DIAGNOSIS — S0003XA Contusion of scalp, initial encounter: Secondary | ICD-10-CM | POA: Diagnosis not present

## 2014-03-03 DIAGNOSIS — I10 Essential (primary) hypertension: Secondary | ICD-10-CM | POA: Insufficient documentation

## 2014-03-03 DIAGNOSIS — W010XXA Fall on same level from slipping, tripping and stumbling without subsequent striking against object, initial encounter: Secondary | ICD-10-CM | POA: Diagnosis not present

## 2014-03-03 DIAGNOSIS — S0990XA Unspecified injury of head, initial encounter: Secondary | ICD-10-CM | POA: Diagnosis present

## 2014-03-03 DIAGNOSIS — W19XXXA Unspecified fall, initial encounter: Secondary | ICD-10-CM

## 2014-03-03 DIAGNOSIS — T148XXA Other injury of unspecified body region, initial encounter: Secondary | ICD-10-CM

## 2014-03-03 DIAGNOSIS — S0083XA Contusion of other part of head, initial encounter: Principal | ICD-10-CM | POA: Insufficient documentation

## 2014-03-03 LAB — COMPREHENSIVE METABOLIC PANEL
ALBUMIN: 3.6 g/dL (ref 3.5–5.2)
ALT: 18 U/L (ref 0–35)
ANION GAP: 12 (ref 5–15)
AST: 17 U/L (ref 0–37)
Alkaline Phosphatase: 118 U/L — ABNORMAL HIGH (ref 39–117)
BUN: 16 mg/dL (ref 6–23)
CHLORIDE: 104 meq/L (ref 96–112)
CO2: 27 mEq/L (ref 19–32)
Calcium: 9.8 mg/dL (ref 8.4–10.5)
Creatinine, Ser: 0.81 mg/dL (ref 0.50–1.10)
GFR calc Af Amer: 70 mL/min — ABNORMAL LOW (ref >90)
GFR calc non Af Amer: 60 mL/min — ABNORMAL LOW (ref >90)
Glucose, Bld: 131 mg/dL — ABNORMAL HIGH (ref 70–99)
POTASSIUM: 4.3 meq/L (ref 3.7–5.3)
SODIUM: 143 meq/L (ref 137–147)
TOTAL PROTEIN: 7.1 g/dL (ref 6.0–8.3)

## 2014-03-03 LAB — CBC WITH DIFFERENTIAL/PLATELET
BASOS PCT: 0 % (ref 0–1)
Basophils Absolute: 0 10*3/uL (ref 0.0–0.1)
EOS ABS: 0.1 10*3/uL (ref 0.0–0.7)
Eosinophils Relative: 1 % (ref 0–5)
HCT: 39.9 % (ref 36.0–46.0)
HEMOGLOBIN: 12.7 g/dL (ref 12.0–15.0)
Lymphocytes Relative: 15 % (ref 12–46)
Lymphs Abs: 1.1 10*3/uL (ref 0.7–4.0)
MCH: 29.5 pg (ref 26.0–34.0)
MCHC: 31.8 g/dL (ref 30.0–36.0)
MCV: 92.8 fL (ref 78.0–100.0)
Monocytes Absolute: 0.6 10*3/uL (ref 0.1–1.0)
Monocytes Relative: 8 % (ref 3–12)
NEUTROS ABS: 5.7 10*3/uL (ref 1.7–7.7)
Neutrophils Relative %: 76 % (ref 43–77)
PLATELETS: 194 10*3/uL (ref 150–400)
RBC: 4.3 MIL/uL (ref 3.87–5.11)
RDW: 13.1 % (ref 11.5–15.5)
WBC: 7.5 10*3/uL (ref 4.0–10.5)

## 2014-03-03 LAB — I-STAT TROPONIN, ED: Troponin i, poc: 0 ng/mL (ref 0.00–0.08)

## 2014-03-03 MED ORDER — FENTANYL CITRATE 0.05 MG/ML IJ SOLN
25.0000 ug | Freq: Once | INTRAMUSCULAR | Status: AC
  Administered 2014-03-03: 25 ug via INTRAMUSCULAR
  Filled 2014-03-03: qty 2

## 2014-03-03 NOTE — ED Provider Notes (Signed)
Medical screening examination/treatment/procedure(s) were conducted as a shared visit with non-physician practitioner(s) and myself.  I personally evaluated the patient during the encounter.   EKG Interpretation None     I examined the patient and reviewed the history present illness as well as her diagnostic studies. At this point time despite her age she seems to be in excellent condition. She is with her daughter who will be available for continued assistance. The patient was discharged with instructions to return if there should be worsening changing or new symptoms  Arby Barrette, MD 03/03/14 2310

## 2014-03-03 NOTE — ED Notes (Signed)
Per EMS: Pt from [facility]. Pt tripped and fell, landed on bottom then hit back of head on floor. Pt denies LOC. Hematoma noted to occipital head. Cut on L forearm, already bandaged on EMS arrival. Good grip in both hands. No neuro deficits. A&O x 4. Pt not on blood thinners.

## 2014-03-03 NOTE — ED Provider Notes (Signed)
CSN: [id number]     Arrival date & time 03/03/14  1955 History   First MD Initiated Contact with Patient 03/03/14 2011     Chief Complaint  Patient presents with  . Fall   Patient is a 80 y.o. female presenting with fall.  Fall    Patient is a 80 y.o. Female who presents to the ED with fall.  Per the patient she was walking around her living room and tripped and fell backward.  Patient states that she put out her left arm to catch herself and hit her bottom and then hit the back of her head.  She did not lose consciousness.  She complains of only a mild headache.  She denies any vision changes, changes in hearing, nausea, vomiting, neck pain, back pain, hip pain, abdominal pain, chest pain, shortness of breath, diarrhea, constipation, melena, hematochezia.  All other ROS are negative.  Patient does admit to some left forearm pain which is painful to move.  Patient's daughter states that the patient fell very recently and received some compression fractures in her back.  Patient does live in an assisted living facility.      Past Medical History  Diagnosis Date  . Hypertension   . High cholesterol   . Back pain   . Macular degeneration   . GERD (gastroesophageal reflux disease)   . OA (osteoarthritis)   . Osteoporosis   . DJD (degenerative joint disease) of cervical spine    Past Surgical History  Procedure Laterality Date  . Cholecystectomy    . Back surgery    . Breast surgery     Family History  Problem Relation Age of Onset  . Heart attack Mother   . Heart attack Father    History  Substance Use Topics  . Smoking status: Never Smoker   . Smokeless tobacco: Not on file  . Alcohol Use: No   OB History   Grav Para Term Preterm Abortions TAB SAB Ect Mult Living                 Review of Systems See HPI, all other ROS are negative.   Allergies  Codeine  Home Medications   Prior to Admission medications   Medication Sig Start Date End Date Taking? Authorizing  Provider  acetaminophen (TYLENOL) 650 MG CR tablet Take 650 mg by mouth every 6 (six) hours as needed for pain.   Yes Historical Provider, MD  alendronate (FOSAMAX) 70 MG tablet Take 70 mg by mouth every 7 (seven) days. Take with a full glass of water on an empty stomach.   Yes Historical Provider, MD  amitriptyline (ELAVIL) 50 MG tablet Take 50 mg by mouth at bedtime.   Yes Historical Provider, MD  amLODipine (NORVASC) 5 MG tablet Take 5 mg by mouth every morning.    Yes Historical Provider, MD  atorvastatin (LIPITOR) 40 MG tablet Take 40 mg by mouth at bedtime.    Yes Historical Provider, MD  Cholecalciferol (VITAMIN D-3) 1000 UNITS CAPS Take 1 capsule by mouth daily.   Yes Historical Provider, MD  docusate sodium (COLACE) 100 MG capsule Take 300 mg by mouth at bedtime.   Yes Historical Provider, MD  lansoprazole (PREVACID) 30 MG capsule Take 30 mg by mouth every other day.    Yes Historical Provider, MD  latanoprost (XALATAN) 0.005 % ophthalmic solution Place 1 drop into both eyes at bedtime.   Yes Historical Provider, MD  lisinopril (PRINIVIL,ZESTRIL) 40 MG tablet Take 40  mg by mouth every morning.    Yes Historical Provider, MD  tolterodine (DETROL LA) 2 MG 24 hr capsule Take 2 mg by mouth every morning.   Yes Historical Provider, MD   BP 184/70  Pulse 95  Temp(Src) 98.1 F (36.7 C) (Oral)  SpO2 100% Physical Exam  Nursing note and vitals reviewed. Constitutional: She is oriented to person, place, and time. She appears well-developed and well-nourished. No distress.  HENT:  Head: Normocephalic.  Mouth/Throat: Oropharynx is clear and moist. No oropharyngeal exudate.  Contusion with scalp hematoma to the left occiput  Eyes: Conjunctivae and EOM are normal. Pupils are equal, round, and reactive to light. No scleral icterus.  Neck: Normal range of motion. Neck supple. No JVD present. No thyromegaly present.  Cardiovascular: Normal rate, regular rhythm and intact distal pulses.  Exam  reveals no gallop and no friction rub.   Murmur heard. 3/6 murmur heard best over 2nd right intercostal space  Pulmonary/Chest: Effort normal and breath sounds normal. No respiratory distress. She has no wheezes. She has no rales. She exhibits no tenderness.  Abdominal: Soft. Bowel sounds are normal. She exhibits no distension and no mass. There is no tenderness. There is no rebound and no guarding.  Musculoskeletal:       Left elbow: She exhibits normal range of motion, no swelling, no effusion, no deformity and no laceration. No tenderness found. No radial head, no medial epicondyle, no lateral epicondyle and no olecranon process tenderness noted.       Cervical back: Normal.       Thoracic back: Normal.       Lumbar back: Normal.       Left forearm: She exhibits tenderness, bony tenderness, swelling, edema and laceration. She exhibits no deformity.       Arms:      Left hand: She exhibits normal range of motion, no tenderness, no bony tenderness, normal two-point discrimination, normal capillary refill, no deformity, no laceration and no swelling. Normal sensation noted. Normal strength noted.  Lymphadenopathy:    She has no cervical adenopathy.  Neurological: She is alert and oriented to person, place, and time. She has normal strength. No cranial nerve deficit or sensory deficit. Coordination normal.  Skin: Skin is warm and dry. She is not diaphoretic.  Psychiatric: She has a normal mood and affect. Her behavior is normal. Judgment and thought content normal.    ED Course  Procedures (including critical care time) Labs Review Labs Reviewed  COMPREHENSIVE METABOLIC PANEL - Abnormal; Notable for the following:    Glucose, Bld 131 (*)    Alkaline Phosphatase 118 (*)    Total Bilirubin <0.2 (*)    GFR calc non Af Amer 60 (*)    GFR calc Af Amer 70 (*)    All other components within normal limits  CBC WITH DIFFERENTIAL  Randolm Idol, ED    Imaging Review Dg Elbow 2 Views  Left  03/03/2014   CLINICAL DATA:  [place].  Pain.  Abrasion.  EXAM: LEFT ELBOW - 2 VIEW  COMPARISON:  None.  FINDINGS: There is no evidence of fracture, dislocation, or joint effusion. There is no evidence of arthropathy or other focal bone abnormality. Soft tissues are unremarkable.  IMPRESSION: Negative.   Electronically Signed   By: Shon Hale M.D.   On: 03/03/2014 21:46   Dg Forearm Left  03/03/2014   CLINICAL DATA:  Fall.  Left arm pain.  Abrasion.  EXAM: LEFT FOREARM - 2 VIEW  COMPARISON:  COMPARISON  None.  FINDINGS: FINDINGS Bony demineralization. Prominent osteoarthritis at the first carpometacarpal articulation. No discrete radial or ulnar fracture. Wrinkle or laceration along the dorsal mid forearm.  IMPRESSION: IMPRESSION 1. No visible fracture. 2. Bony demineralization. 3. Prominent osteoarthritis of the first carpometacarpal articulation.   Electronically Signed   By: Sherryl Barters M.D.   On: 03/03/2014 21:46   Ct Head Wo Contrast  03/03/2014   CLINICAL DATA:  Status post fall.  Concern for head injury.  EXAM: CT HEAD WITHOUT CONTRAST  TECHNIQUE: Contiguous axial images were obtained from the base of the skull through the vertex without intravenous contrast.  COMPARISON:  CT of the head performed 09/23/2012  FINDINGS: There is no evidence of acute infarction, mass lesion, or intra- or extra-axial hemorrhage on CT.  Diffuse periventricular and subcortical white matter change likely reflects small vessel ischemic microangiopathy. Prominence of the ventricles and sulci reflects mild to moderate cortical volume loss. Mild cerebellar atrophy is noted.  The brainstem and fourth ventricle are within normal limits. The basal ganglia are unremarkable in appearance. The cerebral hemispheres demonstrate grossly normal gray-white differentiation. No mass effect or midline shift is seen.  There is no evidence of fracture; visualized osseous structures are unremarkable in appearance. The visualized portions  of the orbits are within normal limits. The paranasal sinuses and mastoid air cells are well-aerated. Soft tissue swelling is noted on the right side at the vertex.  IMPRESSION: 1. No evidence of traumatic intracranial injury or fracture. 2. Soft tissue swelling noted on the right side at the vertex. 3. Diffuse small vessel ischemic microangiopathy. 4. Mild to moderate cortical volume loss.   Electronically Signed   By: Garald Balding M.D.   On: 03/03/2014 21:41     EKG Interpretation None      MDM   Final diagnoses:  Fall, initial encounter  Scalp hematoma, initial encounter  Skin avulsion  Left forearm pain   Patient is a 80 y.o. Female who presents to the ED with fall and left forearm pain.  Physical examination reveals hematoma to the occiput of the left scalp and bruising to the left arm.  There are no obvious deformities.  Given age and history of fall the patient had a CT scan of the head which was negative for acute abnormalities.  Left elbow and forearm were xrayed given trauma.  There were no acute bony abnormalities.  Arm is neurovascularly intact.  CMP reveals mildly elevated Alk phos and bilirubin but there was a benign abdominal exam.  CBC, troponin are otherwise unremarkable.  Patient was also seen by Dr. Colvin Caroli who agrees with the above workup and plan.  Patient is stable for discharge back to assisted living facility.  Patient to return for changes from baseline behavior, somnolence or confusion.  Patient states understanding and agreement.      Cherylann Parr, PA-C 03/03/14 2341

## 2014-03-03 NOTE — ED Notes (Signed)
Bed: WA06 Expected date:  Expected time:  Means of arrival:  Comments: 80 yr old fall, hematoma

## 2014-03-03 NOTE — ED Notes (Signed)
Pt reports she was turning around and tripped over something. Pt has lump noted to back of head, cut to L forearm. Denies LOC, no dizziness before or after fall.

## 2014-03-03 NOTE — Discharge Instructions (Signed)
Fall Prevention and Home Safety °Falls cause injuries and can affect all age groups. It is possible to use preventive measures to significantly decrease the likelihood of falls. There are many simple measures which can make your home safer and prevent falls. °OUTDOORS °· Repair cracks and edges of walkways and driveways. °· Remove high doorway thresholds. °· Trim shrubbery on the main path into your home. °· Have good outside lighting. °· Clear walkways of tools, rocks, debris, and clutter. °· Check that handrails are not broken and are securely fastened. Both sides of steps should have handrails. °· Have leaves, snow, and ice cleared regularly. °· Use sand or salt on walkways during winter months. °· In the garage, clean up grease or oil spills. °BATHROOM °· Install night lights. °· Install grab bars by the toilet and in the tub and shower. °· Use non-skid mats or decals in the tub or shower. °· Place a plastic non-slip stool in the shower to sit on, if needed. °· Keep floors dry and clean up all water on the floor immediately. °· Remove soap buildup in the tub or shower on a regular basis. °· Secure bath mats with non-slip, double-sided rug tape. °· Remove throw rugs and tripping hazards from the floors. °BEDROOMS °· Install night lights. °· Make sure a bedside light is easy to reach. °· Do not use oversized bedding. °· Keep a telephone by your bedside. °· Have a firm chair with side arms to use for getting dressed. °· Remove throw rugs and tripping hazards from the floor. °KITCHEN °· Keep handles on pots and pans turned toward the center of the stove. Use back burners when possible. °· Clean up spills quickly and allow time for drying. °· Avoid walking on wet floors. °· Avoid hot utensils and knives. °· Position shelves so they are not too high or low. °· Place commonly used objects within easy reach. °· If necessary, use a sturdy step stool with a grab bar when reaching. °· Keep electrical cables out of the  way. °· Do not use floor polish or wax that makes floors slippery. If you must use wax, use non-skid floor wax. °· Remove throw rugs and tripping hazards from the floor. °STAIRWAYS °· Never leave objects on stairs. °· Place handrails on both sides of stairways and use them. Fix any loose handrails. Make sure handrails on both sides of the stairways are as long as the stairs. °· Check carpeting to make sure it is firmly attached along stairs. Make repairs to worn or loose carpet promptly. °· Avoid placing throw rugs at the top or bottom of stairways, or properly secure the rug with carpet tape to prevent slippage. Get rid of throw rugs, if possible. °· Have an electrician put in a light switch at the top and bottom of the stairs. °OTHER FALL PREVENTION TIPS °· Wear low-heel or rubber-soled shoes that are supportive and fit well. Wear closed toe shoes. °· When using a stepladder, make sure it is fully opened and both spreaders are firmly locked. Do not climb a closed stepladder. °· Add color or contrast paint or tape to grab bars and handrails in your home. Place contrasting color strips on first and last steps. °· Learn and use mobility aids as needed. Install an electrical emergency response system. °· Turn on lights to avoid dark areas. Replace light bulbs that burn out immediately. Get light switches that glow. °· Arrange furniture to create clear pathways. Keep furniture in the same place. °·   Firmly attach carpet with non-skid or double-sided tape. °· Eliminate uneven floor surfaces. °· Select a carpet pattern that does not visually hide the edge of steps. °· Be aware of all pets. °OTHER HOME SAFETY TIPS °· Set the water temperature for 120° F (48.8° C). °· Keep emergency numbers on or near the telephone. °· Keep smoke detectors on every level of the home and near sleeping areas. °Document Released: 05/26/2002 Document Revised: 12/05/2011 Document Reviewed: 08/25/2011 °[employer]® Patient Information ©2015  [employer]. This information is not intended to replace advice given to you by your health care provider. Make sure you discuss any questions you have with your health care provider. ° °Hematoma °A hematoma is a collection of blood under the skin, in an organ, in a body space, in a joint space, or in other tissue. The blood can clot to form a lump that you can see and feel. The lump is often firm and may sometimes become sore and tender. Most hematomas get better in a few days to weeks. However, some hematomas may be serious and require medical care. Hematomas can range in size from very small to very large. °CAUSES  °A hematoma can be caused by a blunt or penetrating injury. It can also be caused by spontaneous leakage from a blood vessel under the skin. Spontaneous leakage from a blood vessel is more likely to occur in older people, especially those taking blood thinners. Sometimes, a hematoma can develop after certain medical procedures. °SIGNS AND SYMPTOMS  °· A firm lump on the body. °· Possible pain and tenderness in the area. °· Bruising. Blue, dark blue, purple-red, or yellowish skin may appear at the site of the hematoma if the hematoma is close to the surface of the skin. °For hematomas in deeper tissues or body spaces, the signs and symptoms may be subtle. For example, an intra-abdominal hematoma may cause abdominal pain, weakness, fainting, and shortness of breath. An intracranial hematoma may cause a headache or symptoms such as weakness, trouble speaking, or a change in consciousness. °DIAGNOSIS  °A hematoma can usually be diagnosed based on your medical history and a physical exam. Imaging tests may be needed if your health care provider suspects a hematoma in deeper tissues or body spaces, such as the abdomen, head, or chest. These tests may include ultrasonography or a CT scan.  °TREATMENT  °Hematomas usually go away on their own over time. Rarely does the blood need to be drained out of the  body. Large hematomas or those that may affect vital organs will sometimes need surgical drainage or monitoring. °HOME CARE INSTRUCTIONS  °· Apply ice to the injured area:   °¨ Put ice in a plastic bag.   °¨ Place a towel between your skin and the bag.   °¨ Leave the ice on for 20 minutes, 2-3 times a day for the first 1 to 2 days.   °· After the first 2 days, switch to using warm compresses on the hematoma.   °· Elevate the injured area to help decrease pain and swelling. Wrapping the area with an elastic bandage may also be helpful. Compression helps to reduce swelling and promotes shrinking of the hematoma. Make sure the bandage is not wrapped too tight.   °· If your hematoma is on a lower extremity and is painful, crutches may be helpful for a couple days.   °· Only take over-the-counter or prescription medicines as directed by your health care provider. °SEEK IMMEDIATE MEDICAL CARE IF:  °· You have increasing pain, or your   pain is not controlled with medicine.   °· You have a fever.   °· You have worsening swelling or discoloration.   °· Your skin over the hematoma breaks or starts bleeding.   °· Your hematoma is in your chest or abdomen and you have weakness, shortness of breath, or a change in consciousness. °· Your hematoma is on your scalp (caused by a fall or injury) and you have a worsening headache or a change in alertness or consciousness. °MAKE SURE YOU:  °· Understand these instructions. °· Will watch your condition. °· Will get help right away if you are not doing well or get worse. °Document Released: 01/18/2004 Document Revised: 02/05/2013 Document Reviewed: 11/13/2012 °ExitCare® Patient Information ©2015 [employer]. This information is not intended to replace advice given to you by your health care provider. Make sure you discuss any questions you have with your health care provider. ° °

## 2014-03-04 NOTE — ED Provider Notes (Signed)
Medical screening examination/treatment/procedure(s) were conducted as a shared visit with non-physician practitioner(s) and myself.  I personally evaluated the patient during the encounter.   EKG Interpretation None       Arby Barrette, MD 03/04/14 0040

## 2014-04-13 ENCOUNTER — Encounter (HOSPITAL_COMMUNITY): Payer: Self-pay | Admitting: Emergency Medicine

## 2014-04-13 ENCOUNTER — Inpatient Hospital Stay (HOSPITAL_COMMUNITY)
Admission: EM | Admit: 2014-04-13 | Discharge: 2014-04-16 | Disposition: A | Discharge status: Home Health | Payer: Medicare Other | Attending: Cardiovascular Disease | Admitting: Cardiovascular Disease

## 2014-04-13 ENCOUNTER — Emergency Department (HOSPITAL_COMMUNITY): Payer: Medicare Other

## 2014-04-13 DIAGNOSIS — K219 Gastro-esophageal reflux disease without esophagitis: Secondary | ICD-10-CM | POA: Diagnosis present

## 2014-04-13 DIAGNOSIS — E785 Hyperlipidemia, unspecified: Secondary | ICD-10-CM | POA: Diagnosis present

## 2014-04-13 DIAGNOSIS — R0600 Dyspnea, unspecified: Secondary | ICD-10-CM

## 2014-04-13 DIAGNOSIS — I10 Essential (primary) hypertension: Secondary | ICD-10-CM | POA: Diagnosis present

## 2014-04-13 DIAGNOSIS — I350 Nonrheumatic aortic (valve) stenosis: Secondary | ICD-10-CM | POA: Diagnosis present

## 2014-04-13 DIAGNOSIS — M810 Age-related osteoporosis without current pathological fracture: Secondary | ICD-10-CM | POA: Diagnosis present

## 2014-04-13 DIAGNOSIS — Z79899 Other long term (current) drug therapy: Secondary | ICD-10-CM | POA: Diagnosis not present

## 2014-04-13 DIAGNOSIS — Z9181 History of falling: Secondary | ICD-10-CM | POA: Diagnosis not present

## 2014-04-13 DIAGNOSIS — R778 Other specified abnormalities of plasma proteins: Secondary | ICD-10-CM

## 2014-04-13 DIAGNOSIS — I249 Acute ischemic heart disease, unspecified: Secondary | ICD-10-CM

## 2014-04-13 DIAGNOSIS — H3530 Unspecified macular degeneration: Secondary | ICD-10-CM | POA: Diagnosis present

## 2014-04-13 DIAGNOSIS — R748 Abnormal levels of other serum enzymes: Secondary | ICD-10-CM | POA: Diagnosis present

## 2014-04-13 DIAGNOSIS — M1990 Unspecified osteoarthritis, unspecified site: Secondary | ICD-10-CM | POA: Diagnosis present

## 2014-04-13 DIAGNOSIS — F0390 Unspecified dementia without behavioral disturbance: Secondary | ICD-10-CM | POA: Diagnosis present

## 2014-04-13 DIAGNOSIS — I160 Hypertensive urgency: Secondary | ICD-10-CM

## 2014-04-13 DIAGNOSIS — R7989 Other specified abnormal findings of blood chemistry: Secondary | ICD-10-CM | POA: Diagnosis present

## 2014-04-13 HISTORY — DX: Unspecified fall, initial encounter: W19.XXXA

## 2014-04-13 HISTORY — DX: Repeated falls: R29.6

## 2014-04-13 HISTORY — DX: Nonrheumatic aortic (valve) stenosis: I35.0

## 2014-04-13 LAB — BASIC METABOLIC PANEL
Anion gap: 15 (ref 5–15)
BUN: 9 mg/dL (ref 6–23)
CHLORIDE: 106 meq/L (ref 96–112)
CO2: 22 meq/L (ref 19–32)
Calcium: 9.4 mg/dL (ref 8.4–10.5)
Creatinine, Ser: 0.75 mg/dL (ref 0.50–1.10)
GFR calc Af Amer: 81 mL/min — ABNORMAL LOW (ref >90)
GFR, EST NON AFRICAN AMERICAN: 70 mL/min — AB (ref >90)
GLUCOSE: 104 mg/dL — AB (ref 70–99)
Potassium: 3.9 mEq/L (ref 3.7–5.3)
SODIUM: 143 meq/L (ref 137–147)

## 2014-04-13 LAB — CBC
HEMATOCRIT: 41.2 % (ref 36.0–46.0)
HEMOGLOBIN: 13.7 g/dL (ref 12.0–15.0)
MCH: 29.8 pg (ref 26.0–34.0)
MCHC: 33.3 g/dL (ref 30.0–36.0)
MCV: 89.6 fL (ref 78.0–100.0)
PLATELETS: 251 10*3/uL (ref 150–400)
RBC: 4.6 MIL/uL (ref 3.87–5.11)
RDW: 13.9 % (ref 11.5–15.5)
WBC: 7.9 10*3/uL (ref 4.0–10.5)

## 2014-04-13 LAB — URINALYSIS, ROUTINE W REFLEX MICROSCOPIC
Bilirubin Urine: NEGATIVE
GLUCOSE, UA: NEGATIVE mg/dL
Ketones, ur: 40 mg/dL — AB
Nitrite: NEGATIVE
Protein, ur: NEGATIVE mg/dL
SPECIFIC GRAVITY, URINE: 1.01 (ref 1.005–1.030)
UROBILINOGEN UA: 0.2 mg/dL (ref 0.0–1.0)
pH: 6 (ref 5.0–8.0)

## 2014-04-13 LAB — URINE MICROSCOPIC-ADD ON

## 2014-04-13 LAB — I-STAT TROPONIN, ED: Troponin i, poc: 0.17 ng/mL (ref 0.00–0.08)

## 2014-04-13 LAB — TROPONIN I
TROPONIN I: 0.38 ng/mL — AB (ref <0.30)
Troponin I: <0.3 ng/mL (ref <0.30)

## 2014-04-13 LAB — PRO B NATRIURETIC PEPTIDE: PRO B NATRI PEPTIDE: 793 pg/mL — AB (ref 0–450)

## 2014-04-13 MED ORDER — HEPARIN BOLUS VIA INFUSION
3000.0000 [IU] | Freq: Once | INTRAVENOUS | Status: AC
  Administered 2014-04-13: 3000 [IU] via INTRAVENOUS
  Filled 2014-04-13: qty 3000

## 2014-04-13 MED ORDER — CARVEDILOL 3.125 MG PO TABS
3.1250 mg | ORAL_TABLET | Freq: Two times a day (BID) | ORAL | Status: DC
  Administered 2014-04-14 – 2014-04-16 (×5): 3.125 mg via ORAL
  Filled 2014-04-13 (×8): qty 1

## 2014-04-13 MED ORDER — FESOTERODINE FUMARATE ER 4 MG PO TB24
4.0000 mg | ORAL_TABLET | Freq: Every day | ORAL | Status: DC
  Administered 2014-04-14 – 2014-04-16 (×3): 4 mg via ORAL
  Filled 2014-04-13 (×3): qty 1

## 2014-04-13 MED ORDER — ONDANSETRON HCL 4 MG/2ML IJ SOLN: 4.0000 mg | Freq: Four times a day (QID) | INTRAMUSCULAR | Status: DC | PRN

## 2014-04-13 MED ORDER — PANTOPRAZOLE SODIUM 40 MG PO TBEC
40.0000 mg | DELAYED_RELEASE_TABLET | Freq: Every day | ORAL | Status: DC
  Administered 2014-04-14 – 2014-04-16 (×3): 40 mg via ORAL
  Filled 2014-04-13 (×3): qty 1

## 2014-04-13 MED ORDER — ASPIRIN EC 81 MG PO TBEC
81.0000 mg | DELAYED_RELEASE_TABLET | Freq: Every day | ORAL | Status: DC
  Administered 2014-04-14 – 2014-04-16 (×3): 81 mg via ORAL
  Filled 2014-04-13 (×3): qty 1

## 2014-04-13 MED ORDER — ATORVASTATIN CALCIUM 40 MG PO TABS
40.0000 mg | ORAL_TABLET | Freq: Every day | ORAL | Status: DC
  Administered 2014-04-14 – 2014-04-15 (×2): 40 mg via ORAL
  Filled 2014-04-13 (×4): qty 1

## 2014-04-13 MED ORDER — SODIUM CHLORIDE 0.9 % IJ SOLN: 3.0000 mL | INTRAMUSCULAR | Status: DC | PRN

## 2014-04-13 MED ORDER — LISINOPRIL 40 MG PO TABS
40.0000 mg | ORAL_TABLET | Freq: Every morning | ORAL | Status: DC
  Administered 2014-04-14 – 2014-04-16 (×3): 40 mg via ORAL
  Filled 2014-04-13 (×3): qty 1

## 2014-04-13 MED ORDER — ASPIRIN 81 MG PO CHEW
324.0000 mg | CHEWABLE_TABLET | Freq: Once | ORAL | Status: AC
  Administered 2014-04-13: 324 mg via ORAL
  Filled 2014-04-13: qty 4

## 2014-04-13 MED ORDER — ACETAMINOPHEN 325 MG PO TABS
650.0000 mg | ORAL_TABLET | ORAL | Status: DC | PRN
  Administered 2014-04-14: 650 mg via ORAL
  Filled 2014-04-13: qty 2

## 2014-04-13 MED ORDER — DOCUSATE SODIUM 100 MG PO CAPS
300.0000 mg | ORAL_CAPSULE | Freq: Every day | ORAL | Status: DC
  Administered 2014-04-14 – 2014-04-15 (×2): 300 mg via ORAL
  Filled 2014-04-13 (×4): qty 3

## 2014-04-13 MED ORDER — SODIUM CHLORIDE 0.9 % IJ SOLN
3.0000 mL | Freq: Two times a day (BID) | INTRAMUSCULAR | Status: DC
  Administered 2014-04-14 – 2014-04-16 (×3): 3 mL via INTRAVENOUS

## 2014-04-13 MED ORDER — SODIUM CHLORIDE 0.9 % IV SOLN: 250.0000 mL | INTRAVENOUS | Status: DC | PRN

## 2014-04-13 MED ORDER — AMITRIPTYLINE HCL 50 MG PO TABS
50.0000 mg | ORAL_TABLET | Freq: Every day | ORAL | Status: DC
  Administered 2014-04-14 – 2014-04-15 (×2): 50 mg via ORAL
  Filled 2014-04-13 (×3): qty 1

## 2014-04-13 MED ORDER — NITROGLYCERIN 0.4 MG SL SUBL: 0.4000 mg | SUBLINGUAL_TABLET | SUBLINGUAL | Status: DC | PRN

## 2014-04-13 MED ORDER — AMLODIPINE BESYLATE 5 MG PO TABS
5.0000 mg | ORAL_TABLET | Freq: Every morning | ORAL | Status: DC
  Administered 2014-04-14 – 2014-04-16 (×3): 5 mg via ORAL
  Filled 2014-04-13 (×3): qty 1

## 2014-04-13 MED ORDER — LATANOPROST 0.005 % OP SOLN
1.0000 [drp] | Freq: Every day | OPHTHALMIC | Status: DC
  Administered 2014-04-14 – 2014-04-15 (×2): 1 [drp] via OPHTHALMIC
  Filled 2014-04-13: qty 2.5

## 2014-04-13 MED ORDER — HEPARIN (PORCINE) IN NACL 100-0.45 UNIT/ML-% IJ SOLN
650.0000 [IU]/h | INTRAMUSCULAR | Status: DC
  Administered 2014-04-13: 600 [IU]/h via INTRAVENOUS
  Administered 2014-04-15: 650 [IU]/h via INTRAVENOUS
  Filled 2014-04-13 (×2): qty 250

## 2014-04-13 NOTE — H&P (Signed)
Patient ID: Alyssa FoersterMarian Cummings MRN: [id number], DOB/AGE: [birth year]   Admit date: 04/13/2014   Primary Physician: Neldon LabellaMILLER,LISA LYNN, MD Primary Cardiologist: new - seen by M. Vaness Jelinski  Pt. Profile:  80 y/o female without prior cardiac hx who presented to the ED today with a 2 day h/o dyspnea and has been found to have an elevated troponin.  Problem List  Past Medical History  Diagnosis Date  . Hypertension   . High cholesterol   . Back pain   . Macular degeneration   . GERD (gastroesophageal reflux disease)   . OA (osteoarthritis)   . Osteoporosis   . DJD (degenerative joint disease) of cervical spine   . Moderate aortic stenosis     a. 07/2013 Echo: EF 60-65%, Gr 1 DD, mild AI, mod AS, tirv MR, mildly dil LA.  . Falls     a. uses walker, PT 3x/wk. Last fall 03/2014.    Past Surgical History  Procedure Laterality Date  . Cholecystectomy    . Back surgery    . Breast surgery       Allergies  Allergies  Allergen Reactions  . Codeine Nausea And Vomiting    HPI  80 year old female without a prior history of coronary artery disease. She does have a history of a systolic murmur with moderate aortic stenosis noted on her most recent echo in February 2015. She lives in a retirement community and remains in independent living. She uses a walker to ambulate and does have a history of falls. Following a fall approximately 2 years ago, she broke her coccyx. She complained of dyspnea following that episode and was seen by Dr. Donnie Ahoilley with apparent normal cardiac workup. Since then, she has done reasonably well until a few months ago when she began to note dyspnea on exertion while making her bed. Despite this, she was not expressing dyspnea exertion when walking to the cafeteria of her retirement facility. A few weeks ago, she appears to mechanical fall after tripping over her ironing board. She did not express any loss of consciousness. She has been receiving physical therapy 3 days a  week and has been doing fairly well. Over the past 2-3 days, she has been expressing general malaise and yesterday reported to her daughter that she was feeling a little bit short of breath. She was seen by physical therapy today and was noted to be short of breath and her blood pressure was checked and found to be elevated at 180/100. Oxygen saturations were reportedly lower than normal though we don't have exact figures. Her daughter was contacted by the physical therapist and subsequently they called her primary care provider who recommended evaluation in the emergency room.  Here, her ECG was felt to be abnormal and initially interventional cardiology was called to evaluate for possible ST elevation MI. Following evaluation, it was not felt to represent an ST elevation MI. Further emergency room evaluation has revealed a mild elevation of her troponin 0.38 and mild elevation of BNP to 793. Chest x-ray has shown no acute pulmonary pulmonary abnormality. She denies chest pain. She currently has no dyspnea at rest. She denies palpitations, pnd, orthopnea, n, v, dizziness, syncope, edema, weight gain, or early satiety.   Home Medications  Prior to Admission medications   Medication Sig Start Date End Date Taking? Authorizing Provider  acetaminophen (TYLENOL) 650 MG CR tablet Take 650 mg by mouth every 6 (six) hours as needed for pain.   Yes Historical Provider, MD  alendronate (  FOSAMAX) 70 MG tablet Take 70 mg by mouth every 7 (seven) days. Take with a full glass of water on an empty stomach. Take on Saturdays   Yes Historical Provider, MD  amitriptyline (ELAVIL) 50 MG tablet Take 50 mg by mouth at bedtime.   Yes Historical Provider, MD  amLODipine (NORVASC) 5 MG tablet Take 5 mg by mouth every morning.    Yes Historical Provider, MD  atorvastatin (LIPITOR) 40 MG tablet Take 40 mg by mouth at bedtime.    Yes Historical Provider, MD  Cholecalciferol (VITAMIN D-3) 1000 UNITS CAPS Take 1 capsule by mouth  daily.   Yes Historical Provider, MD  docusate sodium (COLACE) 100 MG capsule Take 300 mg by mouth at bedtime.   Yes Historical Provider, MD  lansoprazole (PREVACID) 30 MG capsule Take 30 mg by mouth every other day.    Yes Historical Provider, MD  latanoprost (XALATAN) 0.005 % ophthalmic solution Place 1 drop into both eyes at bedtime.   Yes Historical Provider, MD  lisinopril (PRINIVIL,ZESTRIL) 40 MG tablet Take 40 mg by mouth every morning.    Yes Historical Provider, MD  tolterodine (DETROL LA) 2 MG 24 hr capsule Take 2 mg by mouth every morning.   Yes Historical Provider, MD   Family History  Family History  Problem Relation Age of Onset  . Heart attack Mother   . Heart attack Father    Social History  History   Social History  . Marital Status: Widowed    Spouse Name: [name]    Number of Children: [name]  . Years of Education: [name]   Occupational History  . Not on file.   Social History Main Topics  . Smoking status: Never Smoker   . Smokeless tobacco: Not on file  . Alcohol Use: No  . Drug Use: No  . Sexual Activity: Not on file   Other Topics Concern  . Not on file   Social History Narrative  . No narrative on file     Review of Systems General:  +++ general malaise.  No chills, fever, night sweats or weight changes.  Cardiovascular:  No chest pain, +++ dyspnea on exertion, edema, orthopnea, palpitations, paroxysmal nocturnal dyspnea. Dermatological: No rash, lesions/masses Respiratory: No cough, +++ dyspnea Urologic: No hematuria, dysuria Abdominal:   +++ Constipation.  No nausea, vomiting, diarrhea, bright red blood per rectum, melena, or hematemesis Neurologic:  No visual changes, wkns, changes in mental status. All other systems reviewed and are otherwise negative except as noted above.  Physical Exam  Blood pressure 155/62, pulse 92, temperature 98.2 F (36.8 C), resp. rate 18, SpO2 97.00%.  General: Pleasant, NAD Psych: Normal affect. Neuro: Alert and  oriented X 3. Moves all extremities spontaneously. HEENT: Normal  Neck: Supple without bruits. Radiated cardiac murmur bilaterally.  Lungs:  Resp regular and unlabored, CTA. Heart: RRR no s3, s4. 2/6 systolic ejection murmur last at the right upper sternal border.  Abdomen: Soft, non-tender, non-distended, BS + x 4.  Extremities: No clubbing, cyanosis or edema. DP/PT/Radials 2+ and equal bilaterally.  Labs  Troponin [facility](Point of Care Test)  Recent Labs  04/13/14 1718  TROPIPOC 0.17*    Recent Labs  04/13/14 1741  TROPONINI 0.38*   Lab Results  Component Value Date   WBC 7.9 04/13/2014   HGB 13.7 04/13/2014   HCT 41.2 04/13/2014   MCV 89.6 04/13/2014   PLT 251 04/13/2014    Recent Labs Lab 04/13/14 1654  NA 143  K 3.9  CL 106  CO2 22  BUN 9  CREATININE 0.75  CALCIUM 9.4  GLUCOSE 104*   Radiology/Studies  Dg Chest Port 1 View  04/13/2014   CLINICAL DATA:  Dyspnea.  EXAM: PORTABLE CHEST - 1 VIEW  COMPARISON:  August 26, 2012.  FINDINGS: Heart size is within normal limits. Stable tracheal deviation to the left is noted most likely due to thyroid enlargement. Both lungs are clear. No pneumothorax or pleural effusion is noted. The visualized skeletal structures are unremarkable.  IMPRESSION: No acute cardiopulmonary abnormality seen.   Electronically Signed   By: Roque Lias M.D.   On: 04/13/2014 20:00   ECG  Sinus tachycardia, 101, left axis deviation, poor R-wave progression. No acute ST or T changes.   ASSESSMENT AND PLAN  1. Elevated troponin: Patient presented with a 2-3 day history of general malaise and a 2 day history of dyspnea and was found to be markedly hypertensive at home. She has not had any chest pain. ECG is without acute changes. Troponin is mildly elevated at 0.38. Blood pressure remains elevated in the emergency department. Suspect demand ischemia in the setting of hypertensive urgency. We will admit and continue to cycle cardiac markers. We'll check  a 2-D echocardiogram in the morning. She is previously normal LV function by echocardiogram in February 2015. If cardiac enzyme trend remains relatively flat and echo shows normal LV function, would recommend continued conservative, medical therapy. The patient is not interested in diagnostic catheterization and would only be comfortable with stress testing if it would significantly change her therapeutic course. Continue statin. We'll add aspirin, heparin, beta blocker.   2. Hypertensive urgency:  Patient has a history of hypertension which has been managed with amlodipine and lisinopril at home. Per her daughter, blood pressure does not usually run high. Blood pressure is 180/100 at home today and in that setting, she has had dyspnea. Pressures remain elevated in the ED. Resume home medicines and titrate as necessary. The setting of elevated troponin, will add a beta blocker.  3. Hyperlipidemia: Continue statin therapy. Check lipids and LFTs.  4. Moderate aortic stenosis: This was last noted on echo in February 2015. Follow-up echo tomorrow.  Signed, Nicolasa Ducking, NP 04/13/2014, 8:29 PM I have seen and examined the patient along with Nicolasa Ducking, NP.  I have reviewed the chart, notes and new data.  I agree with NP's note.  Key new complaints: no dyspnea any more Key examination changes: no clinical hypervolemia Key new findings / data: marginally abnormal troponin. ECG "STEMI" was artifactual - no convincing ST changes  PLAN: Serial troponin and check echo. Low dose beta blocker. Conservative mgmt in view of age.  Thurmon Fair, MD, [facility] [phone] 04/13/2014, 8:50 PM

## 2014-04-13 NOTE — Progress Notes (Signed)
ANTICOAGULATION CONSULT NOTE - Initial Consult  Pharmacy Consult for heparin Indication: ACS/STEMI  Allergies  Allergen Reactions  . Codeine Nausea And Vomiting    Patient Measurements:   Heparin Dosing Weight: 49.9 kg  Vital Signs: Temp: 98.2 F (36.8 C) (10/26 1620) BP: 165/117 mmHg (10/26 1620) Pulse Rate: 91 (10/26 1730)  Labs:  Recent Labs  04/13/14 1654  HGB 13.7  HCT 41.2  PLT 251    The CrCl is unknown because both a height and weight (above a minimum accepted value) are required for this calculation.   Medical History: Past Medical History  Diagnosis Date  . Hypertension   . High cholesterol   . Back pain   . Macular degeneration   . GERD (gastroesophageal reflux disease)   . OA (osteoarthritis)   . Osteoporosis   . DJD (degenerative joint disease) of cervical spine   . Heart murmur     Medications:  No anticoagulation PTA  Assessment: 80 y/o F sent from [facility] for SOB when ambulating x 4 days. BP 165/117, Hgb, WBC, plts WNL. POC troponin 0.17, abnormal EKG.  Heparin for ACS and potential STEMI.   Goal of Therapy:  Heparin level 0.3 - 0.7 Monitor platelets by anticoagulation protocol: yes   Plan:  Heparin IV 3000u bolus x 1, then heparin IV 600 units/hr F/u CBC, 8 hr heparin level, 24 hr heparin level  Alyssa Cummings, Alyssa Cummings 04/13/2014,5:47 PM

## 2014-04-13 NOTE — ED Provider Notes (Signed)
CSN: [id number]     Arrival date & time 04/13/14  1525 History   First MD Initiated Contact with Patient 04/13/14 1732     Chief Complaint  Patient presents with  . Polyuria  . Shortness of Breath     (Consider location/radiation/quality/duration/timing/severity/associated sxs/prior Treatment) Patient is a 80 y.o. female presenting with shortness of breath. The history is provided by the patient and a relative.  Shortness of Breath She was noted by her physical therapist to be more short of breath than normal today. Pulse ox is reported to have been low. Patient's daughter called her PCP who advised to come to the ED. She denies chest pain, heaviness, tightness, pressure. Patient states that she has been having dyspnea for the last several weeks which is worse with exertion such as making her bed. She states that the dyspnea has been stable. She denies nausea, vomiting, diaphoresis. She denies orthopnea or paroxysmal nocturnal dyspnea. She did have difficulty with waking up 4 times during the night last night to urinate but this is not necessarily unusual for her. She is complaining of pain in the right lower back which is mild and she relates that to a fall several weeks ago. Pain is mild.  Past Medical History  Diagnosis Date  . Hypertension   . High cholesterol   . Back pain   . Macular degeneration   . GERD (gastroesophageal reflux disease)   . OA (osteoarthritis)   . Osteoporosis   . DJD (degenerative joint disease) of cervical spine   . Heart murmur    Past Surgical History  Procedure Laterality Date  . Cholecystectomy    . Back surgery    . Breast surgery     Family History  Problem Relation Age of Onset  . Heart attack Mother   . Heart attack Father    History  Substance Use Topics  . Smoking status: Never Smoker   . Smokeless tobacco: Not on file  . Alcohol Use: No   OB History   Grav Para Term Preterm Abortions TAB SAB Ect Mult Living                 Review  of Systems  Respiratory: Positive for shortness of breath.   All other systems reviewed and are negative.     Allergies  Codeine  Home Medications   Prior to Admission medications   Medication Sig Start Date End Date Taking? Authorizing Provider  acetaminophen (TYLENOL) 650 MG CR tablet Take 650 mg by mouth every 6 (six) hours as needed for pain.    Historical Provider, MD  alendronate (FOSAMAX) 70 MG tablet Take 70 mg by mouth every 7 (seven) days. Take with a full glass of water on an empty stomach.    Historical Provider, MD  amitriptyline (ELAVIL) 50 MG tablet Take 50 mg by mouth at bedtime.    Historical Provider, MD  amLODipine (NORVASC) 5 MG tablet Take 5 mg by mouth every morning.     Historical Provider, MD  atorvastatin (LIPITOR) 40 MG tablet Take 40 mg by mouth at bedtime.     Historical Provider, MD  Cholecalciferol (VITAMIN D-3) 1000 UNITS CAPS Take 1 capsule by mouth daily.    Historical Provider, MD  docusate sodium (COLACE) 100 MG capsule Take 300 mg by mouth at bedtime.    Historical Provider, MD  lansoprazole (PREVACID) 30 MG capsule Take 30 mg by mouth every other day.     Historical Provider, MD  latanoprost (  XALATAN) 0.005 % ophthalmic solution Place 1 drop into both eyes at bedtime.    Historical Provider, MD  lisinopril (PRINIVIL,ZESTRIL) 40 MG tablet Take 40 mg by mouth every morning.     Historical Provider, MD  tolterodine (DETROL LA) 2 MG 24 hr capsule Take 2 mg by mouth every morning.    Historical Provider, MD   BP 165/117  Pulse 104  Temp(Src) 98.2 F (36.8 C)  Resp 38  SpO2 100% Physical Exam  Nursing note and vitals reviewed.  80 year old female, resting comfortably and in no acute distress. Vital signs are significant for hypertension, tachycardia, and tachypnea. Oxygen saturation is 100%, which is normal. Head is normocephalic and atraumatic. PERRLA, EOMI. Oropharynx is clear. Neck is nontender and supple without adenopathy or JVD. Back is  nontender and there is no CVA tenderness. Lungs are clear without rales, wheezes, or rhonchi. Chest is nontender. Heart has regular rate and rhythm with 2/6 systolic ejection murmur. Abdomen is soft, flat, nontender without masses or hepatosplenomegaly and peristalsis is normoactive. Extremities have no cyanosis or edema, full range of motion is present. Skin is warm and dry without rash. Neurologic: Mental status is normal, cranial nerves are intact, there are no motor or sensory deficits.  ED Course  Procedures (including critical care time) Labs Review Labs Reviewed  CBC  BASIC METABOLIC PANEL  URINALYSIS, ROUTINE W REFLEX MICROSCOPIC  I-STAT TROPOININ, ED    Imaging Review No results found.   EKG Interpretation   Date/Time:  Monday April 13 2014 16:15:43 EDT Ventricular Rate:  97 PR Interval:  162 QRS Duration: 76 QT Interval:  348 QTC Calculation: 441 R Axis:   -39 Text Interpretation:  Sinus rhythm with occasional Premature ventricular  complexes Left axis deviation Moderate voltage criteria for LVH, may be  normal variant ST elevation, consider early repolarization, pericarditis,  or injury Abnormal ECG When compared with ECG of 03/03/2014, ST elevation  in Anterior leads is now Present ST depression in Inferior leads is now  Present ** ** ACUTE MI / STEMI ** ** Confirmed by [facility]  MD, Janan Bogie ([id number])  on 04/13/2014 5:33:50 PM      CRITICAL CARE Performed by: NWGNF,AOZHYGLICK,Nisa Decaire Total critical care time: 40 minutes Critical care time was exclusive of separately billable procedures and treating other patients. Critical care was necessary to treat or prevent imminent or life-threatening deterioration. Critical care was time spent personally by me on the following activities: development of treatment plan with patient and/or surrogate as well as nursing, discussions with consultants, evaluation of patient's response to treatment, examination of patient, obtaining history  from patient or surrogate, ordering and performing treatments and interventions, ordering and review of laboratory studies, ordering and review of radiographic studies, pulse oximetry and re-evaluation of patient's condition.  MDM   Final diagnoses:  Dyspnea  Acute coronary syndrome  Elevated brain natriuretic peptide (BNP) level    Dyspnea uncertain cause. ECG is worrisome for possible STEMI and troponin is mildly elevated. However, patient does not appear to be in any distress. Based on unusual history and her age, it is elected not to call a code STEMI. This decision was done in conjunction with Dr. Herbie BaltimoreHarding who is on-call for STEMI. She is given aspirin and started on heparin and will need to be admitted. Old records are reviewed and she had an echocardiogram which did show moderate aortic stenosis.  Main lab has come back elevated axial confirming evidence of cardiac injury. Case is discussed with Dr.  [facility] cardiology service who states he will come to the ED to admit the patient.  Dione Booze, MD 04/13/14 [phone]

## 2014-04-13 NOTE — ED Notes (Signed)
Pt was [facility] for sob.  Pt c/o sob when ambulating x 4 days.  Also c/o urinating more than normal - waking up 4 x per night.  Also c/o R lower back pain.  RR 38.

## 2014-04-13 NOTE — Progress Notes (Signed)
I agree with the assessment above.   Heiress Williamson, PharmD.  Clinical Pharmacist Pager [phone]   

## 2014-04-13 NOTE — ED Notes (Signed)
Preston FleetingGlick MD notified re: critical troponin

## 2014-04-13 NOTE — ED Notes (Signed)
NOTIFIED DR. GLICK IN PERSON FOR PATIENTS PANIC LAB RESULTS OF I-STAT TROPONIN = 0.17ng/ml @17 :30 PM ,04/13/2014.

## 2014-04-13 NOTE — ED Notes (Signed)
Dr. Preston FleetingGlick at bedside updating patient.

## 2014-04-14 DIAGNOSIS — R7989 Other specified abnormal findings of blood chemistry: Secondary | ICD-10-CM

## 2014-04-14 DIAGNOSIS — I249 Acute ischemic heart disease, unspecified: Secondary | ICD-10-CM

## 2014-04-14 DIAGNOSIS — I359 Nonrheumatic aortic valve disorder, unspecified: Secondary | ICD-10-CM

## 2014-04-14 DIAGNOSIS — R799 Abnormal finding of blood chemistry, unspecified: Secondary | ICD-10-CM

## 2014-04-14 LAB — CBC
HCT: 38.7 % (ref 36.0–46.0)
Hemoglobin: 12.9 g/dL (ref 12.0–15.0)
MCH: 30 pg (ref 26.0–34.0)
MCHC: 33.3 g/dL (ref 30.0–36.0)
MCV: 90 fL (ref 78.0–100.0)
PLATELETS: 198 10*3/uL (ref 150–400)
RBC: 4.3 MIL/uL (ref 3.87–5.11)
RDW: 14 % (ref 11.5–15.5)
WBC: 7.3 10*3/uL (ref 4.0–10.5)

## 2014-04-14 LAB — COMPREHENSIVE METABOLIC PANEL
ALK PHOS: 86 U/L (ref 39–117)
ALT: 18 U/L (ref 0–35)
ANION GAP: 13 (ref 5–15)
AST: 20 U/L (ref 0–37)
Albumin: 3.4 g/dL — ABNORMAL LOW (ref 3.5–5.2)
BUN: 12 mg/dL (ref 6–23)
CO2: 25 meq/L (ref 19–32)
Calcium: 9.1 mg/dL (ref 8.4–10.5)
Chloride: 105 mEq/L (ref 96–112)
Creatinine, Ser: 0.72 mg/dL (ref 0.50–1.10)
GFR, EST AFRICAN AMERICAN: 83 mL/min — AB (ref >90)
GFR, EST NON AFRICAN AMERICAN: 71 mL/min — AB (ref >90)
GLUCOSE: 92 mg/dL (ref 70–99)
Potassium: 3.6 mEq/L — ABNORMAL LOW (ref 3.7–5.3)
SODIUM: 143 meq/L (ref 137–147)
TOTAL PROTEIN: 6.4 g/dL (ref 6.0–8.3)
Total Bilirubin: 0.3 mg/dL (ref 0.3–1.2)

## 2014-04-14 LAB — TROPONIN I: Troponin I: 0.34 ng/mL (ref <0.30)

## 2014-04-14 LAB — HEPARIN LEVEL (UNFRACTIONATED)
Heparin Unfractionated: 0.31 IU/mL (ref 0.30–0.70)
Heparin Unfractionated: 0.44 IU/mL (ref 0.30–0.70)

## 2014-04-14 LAB — LIPID PANEL
CHOL/HDL RATIO: 2.1 ratio
Cholesterol: 190 mg/dL (ref 0–200)
HDL: 91 mg/dL (ref >39)
LDL CALC: 85 mg/dL (ref 0–99)
Triglycerides: 71 mg/dL (ref <150)
VLDL: 14 mg/dL (ref 0–40)

## 2014-04-14 LAB — TSH: TSH: 0.652 u[IU]/mL (ref 0.350–4.500)

## 2014-04-14 LAB — MRSA PCR SCREENING: MRSA BY PCR: NEGATIVE

## 2014-04-14 NOTE — Progress Notes (Signed)
Echo Lab  2D Echocardiogram completed.  Nezzie Manera L Edwyn Inclan, RDCS 04/14/2014 12:23 PM   

## 2014-04-14 NOTE — Progress Notes (Signed)
ANTICOAGULATION CONSULT NOTE - Follow Up Consult  Pharmacy Consult for heparin Indication: chest pain/ACS  Labs:  Recent Labs  04/13/14 1654 04/13/14 1741 04/13/14 2253 04/14/14 0237  HGB 13.7  --   --  12.9  HCT 41.2  --   --  38.7  PLT 251  --   --  198  HEPARINUNFRC  --   --   --  0.31  CREATININE 0.75  --   --  0.72  TROPONINI  --  0.38* <0.30  --     Assessment: 80yo female therapeutic on heparin with initial dosing for elevated troponin (now <0.3) though at very low end of goal and given pt age, size, and CrCl suspect bolus may still be affecting level.  Goal of Therapy:  Heparin level 0.3-0.7 units/ml   Plan:  Will increase heparin gtt slightly to 650 units/hr and check level in 8hr.  Vernard GamblesVeronda Johncharles Fusselman, PharmD, BCPS  04/14/2014,3:46 AM

## 2014-04-14 NOTE — Progress Notes (Signed)
Subjective:  No CP/SOB  Objective:  Temp:  [97.8 F (36.6 C)-98.3 F (36.8 C)] 97.8 F (36.6 C) (10/27 1157) Pulse Rate:  [78-104] 83 (10/27 1200) Resp:  [11-38] 20 (10/27 1200) BP: (120-199)/(53-117) 151/67 mmHg (10/27 1200) SpO2:  [95 %-100 %] 97 % (10/27 1200) Weight:  [119 lb 11.4 oz (54.3 kg)] 119 lb 11.4 oz (54.3 kg) (10/27 0200) Weight change:   Intake/Output from previous day: 10/26 0701 - 10/27 0700 In: 103.2 [I.V.:103.2] Out: 250 [Urine:250]  Intake/Output from this shift: Total I/O In: 26 [I.V.:26] Out: -   Physical Exam: General appearance: alert and no distress Neck: no adenopathy, no carotid bruit, no JVD, supple, symmetrical, trachea midline and thyroid not enlarged, symmetric, no tenderness/mass/nodules Lungs: clear to auscultation bilaterally Heart: soft outflow murmur Abdomen: soft, non-tender; bowel sounds normal; no masses,  no organomegaly Pulses: 2+ and symmetric  Lab Results: Results for orders placed during the hospital encounter of 04/13/14 (from the past 48 hour(s))  BASIC METABOLIC PANEL     Status: Abnormal   Collection Time    04/13/14  4:54 PM      Result Value Ref Range   Sodium 143  137 - 147 mEq/L   Potassium 3.9  3.7 - 5.3 mEq/L   Chloride 106  96 - 112 mEq/L   CO2 22  19 - 32 mEq/L   Glucose, Bld 104 (*) 70 - 99 mg/dL   BUN 9  6 - 23 mg/dL   Creatinine, Ser 0.75  0.50 - 1.10 mg/dL   Calcium 9.4  8.4 - 10.5 mg/dL   GFR calc non Af Amer 70 (*) >90 mL/min   GFR calc Af Amer 81 (*) >90 mL/min   Comment: (NOTE)     The eGFR has been calculated using the CKD EPI equation.     This calculation has not been validated in all clinical situations.     eGFR's persistently <90 mL/min signify possible Chronic Kidney     Disease.   Anion gap 15  5 - 15  CBC     Status: None   Collection Time    04/13/14  4:54 PM      Result Value Ref Range   WBC 7.9  4.0 - 10.5 K/uL   RBC 4.60  3.87 - 5.11 MIL/uL   Hemoglobin 13.7  12.0 - 15.0  g/dL   HCT 41.2  36.0 - 46.0 %   MCV 89.6  78.0 - 100.0 fL   MCH 29.8  26.0 - 34.0 pg   MCHC 33.3  30.0 - 36.0 g/dL   RDW 13.9  11.5 - 15.5 %   Platelets 251  150 - 400 K/uL  I-STAT TROPOININ, ED     Status: Abnormal   Collection Time    04/13/14  5:18 PM      Result Value Ref Range   Troponin i, poc 0.17 (*) 0.00 - 0.08 ng/mL   Comment 3            Comment: Due to the release kinetics of cTnI,     a negative result within the first hours     of the onset of symptoms does not rule out     myocardial infarction with certainty.     If myocardial infarction is still suspected,     repeat the test at appropriate intervals.  PRO B NATRIURETIC PEPTIDE     Status: Abnormal   Collection Time    04/13/14  5:40 PM      Result Value Ref Range   Pro B Natriuretic peptide (BNP) 793.0 (*) 0 - 450 pg/mL  TROPONIN I     Status: Abnormal   Collection Time    04/13/14  5:41 PM      Result Value Ref Range   Troponin I 0.38 (*) <0.30 ng/mL   Comment:            Due to the release kinetics of cTnI,     a negative result within the first hours     of the onset of symptoms does not rule out     myocardial infarction with certainty.     If myocardial infarction is still suspected,     repeat the test at appropriate intervals.     CRITICAL RESULT CALLED TO, READ BACK BY AND VERIFIED WITH:     L SHERWOOD,RN 1903 04/13/14 D BRADLEY  URINALYSIS, ROUTINE W REFLEX MICROSCOPIC     Status: Abnormal   Collection Time    04/13/14  6:28 PM      Result Value Ref Range   Color, Urine YELLOW  YELLOW   APPearance CLEAR  CLEAR   Specific Gravity, Urine 1.010  1.005 - 1.030   pH 6.0  5.0 - 8.0   Glucose, UA NEGATIVE  NEGATIVE mg/dL   Hgb urine dipstick SMALL (*) NEGATIVE   Bilirubin Urine NEGATIVE  NEGATIVE   Ketones, ur 40 (*) NEGATIVE mg/dL   Protein, ur NEGATIVE  NEGATIVE mg/dL   Urobilinogen, UA 0.2  0.0 - 1.0 mg/dL   Nitrite NEGATIVE  NEGATIVE   Leukocytes, UA SMALL (*) NEGATIVE  URINE  MICROSCOPIC-ADD ON     Status: None   Collection Time    04/13/14  6:28 PM      Result Value Ref Range   Squamous Epithelial / LPF RARE  RARE   WBC, UA 0-2  <3 WBC/hpf   RBC / HPF 0-2  <3 RBC/hpf   Bacteria, UA RARE  RARE  TROPONIN I     Status: None   Collection Time    04/13/14 10:53 PM      Result Value Ref Range   Troponin I <0.30  <0.30 ng/mL   Comment:            Due to the release kinetics of cTnI,     a negative result within the first hours     of the onset of symptoms does not rule out     myocardial infarction with certainty.     If myocardial infarction is still suspected,     repeat the test at appropriate intervals.  MRSA PCR SCREENING     Status: None   Collection Time    04/14/14 12:29 AM      Result Value Ref Range   MRSA by PCR NEGATIVE  NEGATIVE   Comment:            The GeneXpert MRSA Assay (FDA     approved for NASAL specimens     only), is one component of a     comprehensive MRSA colonization     surveillance program. It is not     intended to diagnose MRSA     infection nor to guide or     monitor treatment for     MRSA infections.  HEPARIN LEVEL (UNFRACTIONATED)     Status: None   Collection Time    04/14/14  2:37 AM  Result Value Ref Range   Heparin Unfractionated 0.31  0.30 - 0.70 IU/mL   Comment:            IF HEPARIN RESULTS ARE BELOW     EXPECTED VALUES, AND PATIENT     DOSAGE HAS BEEN CONFIRMED,     SUGGEST FOLLOW UP TESTING     OF ANTITHROMBIN III LEVELS.  CBC     Status: None   Collection Time    04/14/14  2:37 AM      Result Value Ref Range   WBC 7.3  4.0 - 10.5 K/uL   RBC 4.30  3.87 - 5.11 MIL/uL   Hemoglobin 12.9  12.0 - 15.0 g/dL   HCT 38.7  36.0 - 46.0 %   MCV 90.0  78.0 - 100.0 fL   MCH 30.0  26.0 - 34.0 pg   MCHC 33.3  30.0 - 36.0 g/dL   RDW 14.0  11.5 - 15.5 %   Platelets 198  150 - 400 K/uL  COMPREHENSIVE METABOLIC PANEL     Status: Abnormal   Collection Time    04/14/14  2:37 AM      Result Value Ref Range    Sodium 143  137 - 147 mEq/L   Potassium 3.6 (*) 3.7 - 5.3 mEq/L   Chloride 105  96 - 112 mEq/L   CO2 25  19 - 32 mEq/L   Glucose, Bld 92  70 - 99 mg/dL   BUN 12  6 - 23 mg/dL   Creatinine, Ser 0.72  0.50 - 1.10 mg/dL   Calcium 9.1  8.4 - 10.5 mg/dL   Total Protein 6.4  6.0 - 8.3 g/dL   Albumin 3.4 (*) 3.5 - 5.2 g/dL   AST 20  0 - 37 U/L   ALT 18  0 - 35 U/L   Alkaline Phosphatase 86  39 - 117 U/L   Total Bilirubin 0.3  0.3 - 1.2 mg/dL   GFR calc non Af Amer 71 (*) >90 mL/min   GFR calc Af Amer 83 (*) >90 mL/min   Comment: (NOTE)     The eGFR has been calculated using the CKD EPI equation.     This calculation has not been validated in all clinical situations.     eGFR's persistently <90 mL/min signify possible Chronic Kidney     Disease.   Anion gap 13  5 - 15  TROPONIN I     Status: Abnormal   Collection Time    04/14/14  2:37 AM      Result Value Ref Range   Troponin I 0.34 (*) <0.30 ng/mL   Comment:            Due to the release kinetics of cTnI,     a negative result within the first hours     of the onset of symptoms does not rule out     myocardial infarction with certainty.     If myocardial infarction is still suspected,     repeat the test at appropriate intervals.     CRITICAL RESULT CALLED TO, READ BACK BY AND VERIFIED WITH:     Ollen Gross [id number] [place]  LIPID PANEL     Status: None   Collection Time    04/14/14  2:37 AM      Result Value Ref Range   Cholesterol 190  0 - 200 mg/dL   Triglycerides 71  <150 mg/dL   HDL 91  >39 mg/dL  Total CHOL/HDL Ratio 2.1     VLDL 14  0 - 40 mg/dL   LDL Cholesterol 85  0 - 99 mg/dL   Comment:            Total Cholesterol/HDL:CHD Risk     Coronary Heart Disease Risk Table                         Men   Women      1/2 Average Risk   3.4   3.3      Average Risk       5.0   4.4      2 X Average Risk   9.6   7.1      3 X Average Risk  23.4   11.0                Use the calculated Patient Ratio     above and the  CHD Risk Table     to determine the patient's CHD Risk.                ATP III CLASSIFICATION (LDL):      <100     mg/dL   Optimal      100-129  mg/dL   Near or Above                        Optimal      130-159  mg/dL   Borderline      160-189  mg/dL   High      >190     mg/dL   Very High  TSH     Status: None   Collection Time    04/14/14  2:37 AM      Result Value Ref Range   TSH 0.652  0.350 - 4.500 uIU/mL  TROPONIN I     Status: None   Collection Time    04/14/14 10:26 AM      Result Value Ref Range   Troponin I <0.30  <0.30 ng/mL   Comment:            Due to the release kinetics of cTnI,     a negative result within the first hours     of the onset of symptoms does not rule out     myocardial infarction with certainty.     If myocardial infarction is still suspected,     repeat the test at appropriate intervals.  HEPARIN LEVEL (UNFRACTIONATED)     Status: None   Collection Time    04/14/14 11:48 AM      Result Value Ref Range   Heparin Unfractionated 0.44  0.30 - 0.70 IU/mL   Comment:            IF HEPARIN RESULTS ARE BELOW     EXPECTED VALUES, AND PATIENT     DOSAGE HAS BEEN CONFIRMED,     SUGGEST FOLLOW UP TESTING     OF ANTITHROMBIN III LEVELS.    Imaging: Imaging results have been reviewed  Assessment/Plan:   1. Active Problems: 2.   Elevated troponin 3.   Time Spent Directly with Patient:  20 minutes  Length of Stay:  LOS: 1 day   Admiitted with SOB and HTN. Enz essentially neg. No acute EKG changes. H/O nl LV fxn and mod AS. 2D pending. Pt does have moderate dementia and as an advanced directive. Started on low dose  BB. I agree with conservative care. No invasive strategy. Prob tx to tele tomorrow and home on Thurs.  Alinda Egolf J 04/14/2014, 1:32 PM

## 2014-04-14 NOTE — Care Management Note (Addendum)
    Page 1 of 1   04/16/2014     4:52:51 PM CARE MANAGEMENT NOTE 04/16/2014  Patient:  Alyssa Cummings, Alyssa Cummings   Account Number:  [id number]  Date Initiated:  04/14/2014  Documentation initiated by:  Elissa Hefty  Subjective/Objective Assessment:   pos troponin     Action/Plan:   from [facility] w hhpt   Anticipated DC Date:  04/16/2014   Anticipated DC Plan:  [place] referral  Clinical Social Worker      DC Planning Services  CM consult      Choice offered to / List presented to:             Status of service:  Completed, signed off Medicare Important Message given?  YES (If response is "NO", the following Medicare IM given date fields will be blank) Date Medicare IM given:  04/16/2014 Medicare IM given by:  Ziair Penson Date Additional Medicare IM given:   Additional Medicare IM given by:    Discharge Disposition:  [place]  Per UR Regulation:  Reviewed for med. necessity/level of care/duration of stay  If discussed at [place] of Stay Meetings, dates discussed:    Comments:  04/16/14 [name], RN, BSN [phone] Pt for dc back to [place] today; confirmed with daughter that pt will have 24h [place] aide upon return home.  Receives HHPT at facility 3x/week.  Daughter to transport back to facility.  10/28 1455 debbie dowell rn,bsn met w da of pt. pt lives in indep living at [facility] and had hhpt there due to recent fall. card rehab states very weak at present and req phy ther eval in hosp. ref for pt obtained and placed in computer. will cont to follow. da hopes to get pt back to indep living and may hire addit help if needed.

## 2014-04-14 NOTE — Progress Notes (Signed)
Pt admitted from ED. Pt A/Ox4, HOH, and has trouble seeing due to macular degeneration. Pt is on RA O2 sats 96-97. Pt in NSR. Will continue to monitor at this time.

## 2014-04-14 NOTE — Progress Notes (Signed)
ANTICOAGULATION CONSULT NOTE - Follow Up Consult  Pharmacy Consult for heparin Indication: chest pain/ACS  Labs:  Recent Labs  04/13/14 1654  04/13/14 2253 04/14/14 0237 04/14/14 1026 04/14/14 1148  HGB 13.7  --   --  12.9  --   --   HCT 41.2  --   --  38.7  --   --   PLT 251  --   --  198  --   --   HEPARINUNFRC  --   --   --  0.31  --  0.44  CREATININE 0.75  --   --  0.72  --   --   TROPONINI  --   < > <0.30 0.34* <0.30  --   < > = values in this interval not displayed.  Assessment: 80yo female here with SOB and elevated troponin on heparin and noted at goal (HL= 0.44).   Goal of Therapy:  Heparin level 0.3-0.7 units/ml   Plan:  -No heparin changes needed -Daily heparin level and CBC  Harland GermanAndrew Adaliz Dobis, Pharm D 04/14/2014 1:22 PM

## 2014-04-15 DIAGNOSIS — I350 Nonrheumatic aortic (valve) stenosis: Secondary | ICD-10-CM

## 2014-04-15 LAB — CBC
HCT: 36.1 % (ref 36.0–46.0)
Hemoglobin: 11.9 g/dL — ABNORMAL LOW (ref 12.0–15.0)
MCH: 29.7 pg (ref 26.0–34.0)
MCHC: 33 g/dL (ref 30.0–36.0)
MCV: 90 fL (ref 78.0–100.0)
PLATELETS: 202 10*3/uL (ref 150–400)
RBC: 4.01 MIL/uL (ref 3.87–5.11)
RDW: 13.8 % (ref 11.5–15.5)
WBC: 5.7 10*3/uL (ref 4.0–10.5)

## 2014-04-15 LAB — HEPARIN LEVEL (UNFRACTIONATED): Heparin Unfractionated: 0.32 IU/mL (ref 0.30–0.70)

## 2014-04-15 NOTE — Progress Notes (Signed)
Transferred to 2 west room 05 by wheelchair, stable. Belongings with pt. Report given to RN.

## 2014-04-15 NOTE — Progress Notes (Signed)
Subjective:  No CP/SOB  Objective:  Temp:  [97.5 F (36.4 C)-98.6 F (37 C)] 97.5 F (36.4 C) (10/28 0752) Pulse Rate:  [73-84] 73 (10/28 0752) Resp:  [15-21] 21 (10/28 1100) BP: (127-178)/(40-95) 127/78 mmHg (10/28 1100) SpO2:  [97 %-98 %] 98 % (10/28 0752) Weight:  [117 lb 1 oz (53.1 kg)] 117 lb 1 oz (53.1 kg) (10/28 0500) Weight change: -2 lb 10.3 oz (-1.2 kg)  Intake/Output from previous day: 10/27 0701 - 10/28 0700 In: 382.5 [P.O.:220; I.V.:162.5] Out: 800 [Urine:800]  Intake/Output from this shift: Total I/O In: 32.5 [I.V.:32.5] Out: 200 [Urine:200]  Physical Exam: General appearance: alert and no distress Neck: no adenopathy, no carotid bruit, no JVD, supple, symmetrical, trachea midline and thyroid not enlarged, symmetric, no tenderness/mass/nodules Lungs: clear to auscultation bilaterally Heart: soft outflow murmur Extremities: extremities normal, atraumatic, no cyanosis or edema  Lab Results: Results for orders placed during the hospital encounter of 04/13/14 (from the past 48 hour(s))  BASIC METABOLIC PANEL     Status: Abnormal   Collection Time    04/13/14  4:54 PM      Result Value Ref Range   Sodium 143  137 - 147 mEq/L   Potassium 3.9  3.7 - 5.3 mEq/L   Chloride 106  96 - 112 mEq/L   CO2 22  19 - 32 mEq/L   Glucose, Bld 104 (*) 70 - 99 mg/dL   BUN 9  6 - 23 mg/dL   Creatinine, Ser 0.75  0.50 - 1.10 mg/dL   Calcium 9.4  8.4 - 10.5 mg/dL   GFR calc non Af Amer 70 (*) >90 mL/min   GFR calc Af Amer 81 (*) >90 mL/min   Comment: (NOTE)     The eGFR has been calculated using the CKD EPI equation.     This calculation has not been validated in all clinical situations.     eGFR's persistently <90 mL/min signify possible Chronic Kidney     Disease.   Anion gap 15  5 - 15  CBC     Status: None   Collection Time    04/13/14  4:54 PM      Result Value Ref Range   WBC 7.9  4.0 - 10.5 K/uL   RBC 4.60  3.87 - 5.11 MIL/uL   Hemoglobin 13.7  12.0 -  15.0 g/dL   HCT 41.2  36.0 - 46.0 %   MCV 89.6  78.0 - 100.0 fL   MCH 29.8  26.0 - 34.0 pg   MCHC 33.3  30.0 - 36.0 g/dL   RDW 13.9  11.5 - 15.5 %   Platelets 251  150 - 400 K/uL  I-STAT TROPOININ, ED     Status: Abnormal   Collection Time    04/13/14  5:18 PM      Result Value Ref Range   Troponin i, poc 0.17 (*) 0.00 - 0.08 ng/mL   Comment 3            Comment: Due to the release kinetics of cTnI,     a negative result within the first hours     of the onset of symptoms does not rule out     myocardial infarction with certainty.     If myocardial infarction is still suspected,     repeat the test at appropriate intervals.  PRO B NATRIURETIC PEPTIDE     Status: Abnormal   Collection Time    04/13/14  5:40  PM      Result Value Ref Range   Pro B Natriuretic peptide (BNP) 793.0 (*) 0 - 450 pg/mL  TROPONIN I     Status: Abnormal   Collection Time    04/13/14  5:41 PM      Result Value Ref Range   Troponin I 0.38 (*) <0.30 ng/mL   Comment:            Due to the release kinetics of cTnI,     a negative result within the first hours     of the onset of symptoms does not rule out     myocardial infarction with certainty.     If myocardial infarction is still suspected,     repeat the test at appropriate intervals.     CRITICAL RESULT CALLED TO, READ BACK BY AND VERIFIED WITH:     L SHERWOOD,RN 1903 04/13/14 D BRAD[facility]  URINALYSIS, ROUTINE W REFLEX MICROSCOPIC     Status: Abnormal   Collection Time    04/13/14  6:28 PM      Result Value Ref Range   Color, Urine YELLOW  YELLOW   APPearance CLEAR  CLEAR   Specific Gravity, Urine 1.010  1.005 - 1.030   pH 6.0  5.0 - 8.0   Glucose, UA NEGATIVE  NEGATIVE mg/dL   Hgb urine dipstick SMALL (*) NEGATIVE   Bilirubin Urine NEGATIVE  NEGATIVE   Ketones, ur 40 (*) NEGATIVE mg/dL   Protein, ur NEGATIVE  NEGATIVE mg/dL   Urobilinogen, UA 0.2  0.0 - 1.0 mg/dL   Nitrite NEGATIVE  NEGATIVE   Leukocytes, UA SMALL (*) NEGATIVE  URINE  MICROSCOPIC-ADD ON     Status: None   Collection Time    04/13/14  6:28 PM      Result Value Ref Range   Squamous Epithelial / LPF RARE  RARE   WBC, UA 0-2  <3 WBC/hpf   RBC / HPF 0-2  <3 RBC/hpf   Bacteria, UA RARE  RARE  TROPONIN I     Status: None   Collection Time    04/13/14 10:53 PM      Result Value Ref Range   Troponin I <0.30  <0.30 ng/mL   Comment:            Due to the release kinetics of cTnI,     a negative result within the first hours     of the onset of symptoms does not rule out     myocardial infarction with certainty.     If myocardial infarction is still suspected,     repeat the test at appropriate intervals.  MRSA PCR SCREENING     Status: None   Collection Time    04/14/14 12:29 AM      Result Value Ref Range   MRSA by PCR NEGATIVE  NEGATIVE   Comment:            The GeneXpert MRSA Assay (FDA     approved for NASAL specimens     only), is one component of a     comprehensive MRSA colonization     surveillance program. It is not     intended to diagnose MRSA     infection nor to guide or     monitor treatment for     MRSA infections.  HEPARIN LEVEL (UNFRACTIONATED)     Status: None   Collection Time    04/14/14  2:37 AM  Result Value Ref Range   Heparin Unfractionated 0.31  0.30 - 0.70 IU/mL   Comment:            IF HEPARIN RESULTS ARE BELOW     EXPECTED VALUES, AND PATIENT     DOSAGE HAS BEEN CONFIRMED,     SUGGEST FOLLOW UP TESTING     OF ANTITHROMBIN III LEVELS.  CBC     Status: None   Collection Time    04/14/14  2:37 AM      Result Value Ref Range   WBC 7.3  4.0 - 10.5 K/uL   RBC 4.30  3.87 - 5.11 MIL/uL   Hemoglobin 12.9  12.0 - 15.0 g/dL   HCT 38.7  36.0 - 46.0 %   MCV 90.0  78.0 - 100.0 fL   MCH 30.0  26.0 - 34.0 pg   MCHC 33.3  30.0 - 36.0 g/dL   RDW 14.0  11.5 - 15.5 %   Platelets 198  150 - 400 K/uL  COMPREHENSIVE METABOLIC PANEL     Status: Abnormal   Collection Time    04/14/14  2:37 AM      Result Value Ref Range    Sodium 143  137 - 147 mEq/L   Potassium 3.6 (*) 3.7 - 5.3 mEq/L   Chloride 105  96 - 112 mEq/L   CO2 25  19 - 32 mEq/L   Glucose, Bld 92  70 - 99 mg/dL   BUN 12  6 - 23 mg/dL   Creatinine, Ser 0.72  0.50 - 1.10 mg/dL   Calcium 9.1  8.4 - 10.5 mg/dL   Total Protein 6.4  6.0 - 8.3 g/dL   Albumin 3.4 (*) 3.5 - 5.2 g/dL   AST 20  0 - 37 U/L   ALT 18  0 - 35 U/L   Alkaline Phosphatase 86  39 - 117 U/L   Total Bilirubin 0.3  0.3 - 1.2 mg/dL   GFR calc non Af Amer 71 (*) >90 mL/min   GFR calc Af Amer 83 (*) >90 mL/min   Comment: (NOTE)     The eGFR has been calculated using the CKD EPI equation.     This calculation has not been validated in all clinical situations.     eGFR's persistently <90 mL/min signify possible Chronic Kidney     Disease.   Anion gap 13  5 - 15  TROPONIN I     Status: Abnormal   Collection Time    04/14/14  2:37 AM      Result Value Ref Range   Troponin I 0.34 (*) <0.30 ng/mL   Comment:            Due to the release kinetics of cTnI,     a negative result within the first hours     of the onset of symptoms does not rule out     myocardial infarction with certainty.     If myocardial infarction is still suspected,     repeat the test at appropriate intervals.     CRITICAL RESULT CALLED TO, READ BACK BY AND VERIFIED WITH:     Ollen Gross [id number] [employer]  LIPID PANEL     Status: None   Collection Time    04/14/14  2:37 AM      Result Value Ref Range   Cholesterol 190  0 - 200 mg/dL   Triglycerides 71  <150 mg/dL   HDL 91  >39 mg/dL  Total CHOL/HDL Ratio 2.1     VLDL 14  0 - 40 mg/dL   LDL Cholesterol 85  0 - 99 mg/dL   Comment:            Total Cholesterol/HDL:CHD Risk     Coronary Heart Disease Risk Table                         Men   Women      1/2 Average Risk   3.4   3.3      Average Risk       5.0   4.4      2 X Average Risk   9.6   7.1      3 X Average Risk  23.4   11.0                Use the calculated Patient Ratio     above and the  CHD Risk Table     to determine the patient's CHD Risk.                ATP III CLASSIFICATION (LDL):      <100     mg/dL   Optimal      100-129  mg/dL   Near or Above                        Optimal      130-159  mg/dL   Borderline      160-189  mg/dL   High      >190     mg/dL   Very High  TSH     Status: None   Collection Time    04/14/14  2:37 AM      Result Value Ref Range   TSH 0.652  0.350 - 4.500 uIU/mL  TROPONIN I     Status: None   Collection Time    04/14/14 10:26 AM      Result Value Ref Range   Troponin I <0.30  <0.30 ng/mL   Comment:            Due to the release kinetics of cTnI,     a negative result within the first hours     of the onset of symptoms does not rule out     myocardial infarction with certainty.     If myocardial infarction is still suspected,     repeat the test at appropriate intervals.  HEPARIN LEVEL (UNFRACTIONATED)     Status: None   Collection Time    04/14/14 11:48 AM      Result Value Ref Range   Heparin Unfractionated 0.44  0.30 - 0.70 IU/mL   Comment:            IF HEPARIN RESULTS ARE BELOW     EXPECTED VALUES, AND PATIENT     DOSAGE HAS BEEN CONFIRMED,     SUGGEST FOLLOW UP TESTING     OF ANTITHROMBIN III LEVELS.  CBC     Status: Abnormal   Collection Time    04/15/14  3:55 AM      Result Value Ref Range   WBC 5.7  4.0 - 10.5 K/uL   RBC 4.01  3.87 - 5.11 MIL/uL   Hemoglobin 11.9 (*) 12.0 - 15.0 g/dL   HCT 36.1  36.0 - 46.0 %   MCV 90.0  78.0 - 100.0 fL   MCH 29.7  26.0 - 34.0 pg   MCHC 33.0  30.0 - 36.0 g/dL   RDW 13.8  11.5 - 15.5 %   Platelets 202  150 - 400 K/uL  HEPARIN LEVEL (UNFRACTIONATED)     Status: None   Collection Time    04/15/14  3:55 AM      Result Value Ref Range   Heparin Unfractionated 0.32  0.30 - 0.70 IU/mL   Comment:            IF HEPARIN RESULTS ARE BELOW     EXPECTED VALUES, AND PATIENT     DOSAGE HAS BEEN CONFIRMED,     SUGGEST FOLLOW UP TESTING     OF ANTITHROMBIN III LEVELS.     Imaging: Imaging results have been reviewed  Assessment/Plan:   1. Active Problems: 2.   Elevated troponin 3.   Time Spent Directly with Patient:  15 minutes  Length of Stay:  LOS: 2 days   2D results noted. Nl LV fxn with moderate AS. VSS. Pt w/o Sx. Will Tx tele. [place] consult. Ambulate. Prob home in AM ([place]).  Lorretta Harp 04/15/2014, 12:24 PM

## 2014-04-15 NOTE — Progress Notes (Signed)
CARDIAC REHAB PHASE I   PRE:  Rate/Rhythm: 80 SR  BP:  Supine:   Sitting: 135/59  Standing:    SaO2:   MODE:  Ambulation: 170 ft   POST:  Rate/Rhythm: 98 SR  BP:  Supine:   Sitting: 127/78  Standing:     SaO2:  1030-1103 Set up pt's breakfast and heated coffee for pt prior to walk. Waited for her to eat her breakfast. Pt walked 170 ft on RA with gait belt use, her rollator and asst x 2. Stopped several times to take standing rest breaks. Denied CP. Her legs did buckle a couple of times as she is weak. To recliner after walk with call bell. Will keep as asst x 2.    Luetta Nuttingharlene Damascus Feldpausch, RN BSN  04/15/2014 11:02 AM

## 2014-04-15 NOTE — Progress Notes (Signed)
ANTICOAGULATION CONSULT NOTE - Follow Up Consult  Pharmacy Consult for heparin Indication: chest pain/ACS  Labs:  Recent Labs  04/13/14 1654  04/13/14 2253 04/14/14 0237 04/14/14 1026 04/14/14 1148 04/15/14 0355  HGB 13.7  --   --  12.9  --   --  11.9*  HCT 41.2  --   --  38.7  --   --  36.1  PLT 251  --   --  198  --   --  202  HEPARINUNFRC  --   --   --  0.31  --  0.44 0.32  CREATININE 0.75  --   --  0.72  --   --   --   TROPONINI  --   < > <0.30 0.34* <0.30  --   --   < > = values in this interval not displayed.  Assessment: 80yo female here with SOB and elevated troponin on heparin and noted at goal (HL= 0.32). No invasive strategy per MD, possibly home in am.  Goal of Therapy:  Heparin level 0.3-0.7 units/ml   Plan:  -No heparin changes needed -Daily heparin level and CBC  Harland GermanAndrew Gorman Safi, Pharm D 04/15/2014 8:05 AM

## 2014-04-16 DIAGNOSIS — I10 Essential (primary) hypertension: Secondary | ICD-10-CM

## 2014-04-16 LAB — CBC
HCT: 36.3 % (ref 36.0–46.0)
HEMOGLOBIN: 11.6 g/dL — AB (ref 12.0–15.0)
MCH: 28.8 pg (ref 26.0–34.0)
MCHC: 32 g/dL (ref 30.0–36.0)
MCV: 90.1 fL (ref 78.0–100.0)
Platelets: 193 10*3/uL (ref 150–400)
RBC: 4.03 MIL/uL (ref 3.87–5.11)
RDW: 13.9 % (ref 11.5–15.5)
WBC: 5.6 10*3/uL (ref 4.0–10.5)

## 2014-04-16 MED ORDER — ASPIRIN 81 MG PO TBEC: 81.0000 mg | DELAYED_RELEASE_TABLET | Freq: Every day | ORAL | Status: DC

## 2014-04-16 MED ORDER — CARVEDILOL 3.125 MG PO TABS: 3.1250 mg | ORAL_TABLET | Freq: Two times a day (BID) | ORAL | Status: DC

## 2014-04-16 NOTE — Discharge Summary (Signed)
Physician Discharge Summary     Cardiologist:  Croitoru(new) Patient ID: Alyssa Cummings MRN: [id number] DOB/AGE: 78/09/1917 78 y.o.  Admit date: 04/13/2014 Discharge date: 04/16/2014  Admission Diagnoses:  Hypertensive Urgency. Elevated troponin  Discharge Diagnoses:  Active Problems:   Elevated troponin   Hypertensive Urgency   Moderate AS   HLD   Discharged Condition: stable  Hospital Course:   80 year old female with no prior history of coronary artery disease. She does have a history of a systolic murmur with moderate aortic stenosis noted on her most recent echo in February 2015. She lives in a retirement community and remains in independent living. She uses a walker to ambulate and does have a history of falls. Following a fall approximately 2 years ago, she broke her coccyx. She complained of dyspnea following that episode and was seen by Dr. Donnie Ahoilley with apparent normal cardiac workup. Since then, she has done reasonably well until a few months ago when she began to note dyspnea on exertion while making her bed. Despite this, she was not expressing dyspnea exertion when walking to the cafeteria of her retirement facility. A few weeks ago, she apparently had a fall after tripping over her ironing board. She did not express any loss of consciousness. She has been receiving physical therapy three days a week and has been doing fairly well. Over the past 2-3 days, she has been expressing general malaise and yesterday reported to her daughter that she was feeling a little bit short of breath. She was seen by physical therapy and was noted to be short of breath and her blood pressure was checked and found to be elevated at 180/100. Oxygen saturations were reportedly lower than normal though we don't have exact figures. Her daughter was contacted by the physical therapist and subsequently they called her primary care provider who recommended evaluation in the emergency room.   Here, her ECG  was felt to be abnormal and initially interventional cardiology was called to evaluate for possible ST elevation MI. Following evaluation, it was not felt to represent an ST elevation MI. Further emergency room evaluation has revealed a mild elevation of her troponin 0.38 and mild elevation of BNP to 793. Chest x-ray has shown no acute pulmonary pulmonary abnormality. She denies chest pain. She currently has no dyspnea at rest. She denies palpitations, pnd, orthopnea, n, v, dizziness, syncope, edema, weight gain, or early satiety.   She was admitted for observation.  A beta blocker was added due to elevated troponin and for better BP control.  Amlopdipine and ACE continued.  A 2D echo was ordered to reevaluate.  This revealed normal LV function, moderate AS. Follow up troponin was normal.  No plans for invasive workup. Pt is feeling better. No chest pain or SOB.  She ambulated 19070ft with a rolling walker with CR.  Some assistance was needed.  The patient was seen by Dr. Clifton JamesMcAlhany  who felt she was stable for DC to independent living.   Consults: Cardiac rehab  Significant Diagnostic Studies:  Echo Study Conclusions  - Left ventricle: The cavity size was normal. Wall thickness was normal. Systolic function was normal. The estimated ejection fraction was in the range of 55% to 60%. Wall motion was normal; there were no regional wall motion abnormalities. Doppler parameters are consistent with abnormal left ventricular relaxation (grade 1 diastolic dysfunction). - Aortic valve: There was moderate stenosis. There was mild regurgitation. - Mitral valve: Severely calcified annulus. Mildly thickened leaflets . There was mild regurgitation.  Impressions:  - Normal LV function; grade 1 diastolic dysfunction; severely calcified aortic valve with moderate AS; mild AI; mild MR; cannot R/O right atrial mass; suggest TEE to further assess.  Lipid Panel     Component Value Date/Time   CHOL 190  04/14/2014 0237   TRIG 71 04/14/2014 0237   HDL 91 04/14/2014 0237   CHOLHDL 2.1 04/14/2014 0237   VLDL 14 04/14/2014 0237   LDLCALC 85 04/14/2014 0237      Treatments:   Discharge Exam: Blood pressure 131/56, pulse 71, temperature 97.7 F (36.5 C), temperature source Oral, resp. rate 15, height 5' (1.524 m), weight 116 lb 4.8 oz (52.753 kg), SpO2 96.00%.   Disposition: 01-Home or Self Care      Discharge Instructions   Diet - low sodium heart healthy    Complete by:  As directed      Increase activity slowly    Complete by:  As directed             Medication List         acetaminophen 650 MG CR tablet  Commonly known as:  TYLENOL  Take 650 mg by mouth every 6 (six) hours as needed for pain.     alendronate 70 MG tablet  Commonly known as:  FOSAMAX  Take 70 mg by mouth every 7 (seven) days. Take with a full glass of water on an empty stomach. Take on Saturdays     amitriptyline 50 MG tablet  Commonly known as:  ELAVIL  Take 50 mg by mouth at bedtime.     amLODipine 5 MG tablet  Commonly known as:  NORVASC  Take 5 mg by mouth every morning.     aspirin 81 MG EC tablet  Take 1 tablet (81 mg total) by mouth daily.     atorvastatin 40 MG tablet  Commonly known as:  LIPITOR  Take 40 mg by mouth at bedtime.     carvedilol 3.125 MG tablet  Commonly known as:  COREG  Take 1 tablet (3.125 mg total) by mouth 2 (two) times daily with a meal.     docusate sodium 100 MG capsule  Commonly known as:  COLACE  Take 300 mg by mouth at bedtime.     lansoprazole 30 MG capsule  Commonly known as:  PREVACID  Take 30 mg by mouth every other day.     latanoprost 0.005 % ophthalmic solution  Commonly known as:  XALATAN  Place 1 drop into both eyes at bedtime.     lisinopril 40 MG tablet  Commonly known as:  PRINIVIL,ZESTRIL  Take 40 mg by mouth every morning.     tolterodine 2 MG 24 hr capsule  Commonly known as:  DETROL LA  Take 2 mg by mouth every morning.       Vitamin D-3 1000 UNITS Caps  Take 1 capsule by mouth daily.       Follow-up Information   Follow up with Wilburt FinlayHAGER, Jayme Mednick, PA-C On 05/12/2014. (11:30AM)    Specialty:  Physician Assistant   Contact information:   [address] [place] [address] [phone]      Greater than 30 minutes was spent completing the patient's discharge.    SignedWilburt Finlay: Lealon Vanputten, PAC 04/16/2014, 10:16 AM

## 2014-04-16 NOTE — Evaluation (Signed)
Physical Therapy Evaluation Patient Details Name: Alyssa Cummings MRN: [id number] DOB: Jun 05, 1918 Today's Date: 04/16/2014   History of Present Illness  80 y.o. female was admitted with hypertensive emergency and elevated troponin.  Clinical Impression  Pt admitted with above. Pt currently with functional limitations due to the deficits listed below (see PT Problem List). Required min assist with transfers due to frequent loss of balance to posterior. Ambulates generally well with use of a rollator at a min guard level. Had a bout of minimal urinary incontinence while ambulating. Patient will require 24 hour care at home due to high fall risk and continued HHPT to address balance deficits. Per RN, pts daughter said this will be arranged. If this cannot be arranged I recommend SNF placement for continued rehabilitation. Pt will benefit from skilled PT to increase their independence and safety with mobility to allow discharge to the venue listed below.      Follow Up Recommendations Home health PT;Supervision/Assistance - 24 hour (If 24 hour care not confirmed, pt WILL NEED SNF PLACEMENT)    Equipment Recommendations  None recommended by PT    Recommendations for Other Services       Precautions / Restrictions Precautions Precautions: Fall Restrictions Weight Bearing Restrictions: No      Mobility  Bed Mobility Overal bed mobility: Needs Assistance Bed Mobility: Supine to Sit     Supine to sit: Supervision     General bed mobility comments: Supervision for safety, no physical assist required  Transfers Overall transfer level: Needs assistance Equipment used: 4-wheeled walker Transfers: Sit to/from Stand Sit to Stand: Min assist         General transfer comment: Min assist for loss of balance to posterior performed from bed and BSC. Pt continues to loss balance posteriorly until weight is through UEs on rollator.  Ambulation/Gait Ambulation/Gait assistance: Min  guard Ambulation Distance (Feet): 80 Feet Assistive device: 4-wheeled walker Gait Pattern/deviations: Step-through pattern;Decreased stride length   Gait velocity interpretation: Below normal speed for age/gender General Gait Details: Small steps but good control of rollator. VC for forward gaze. Required several cues to lock wheels on rollator before sitting on rollator or moving it to the side to wash her hands.  Stairs            Wheelchair Mobility    Modified Rankin (Stroke Patients Only)       Balance Overall balance assessment: Needs assistance;History of Falls Sitting-balance support: No upper extremity supported;Feet supported Sitting balance-Leahy Scale: Fair     Standing balance support: Bilateral upper extremity supported Standing balance-Leahy Scale: Poor                               Pertinent Vitals/Pain Pain Assessment: No/denies pain    Home Living Family/patient expects to be discharged to:: Assisted living Living Arrangements: Alone Available Help at Discharge: Personal care attendant ("Trudy Champan") Type of Home: Assisted living Home Access: Level entry     Home Layout: One level Home Equipment: Walker - 4 wheels;Grab bars - toilet      Prior Function Level of Independence: Independent with assistive device(s)         Comments: Uses rollator for ambulation     Hand Dominance   Dominant Hand: Right    Extremity/Trunk Assessment   Upper Extremity Assessment: Defer to OT evaluation           Lower Extremity Assessment: Generalized weakness  Communication   Communication: HOH  Cognition Arousal/Alertness: Awake/alert Behavior During Therapy: WFL for tasks assessed/performed Overall Cognitive Status: Within Functional Limits for tasks assessed                      General Comments General comments (skin integrity, edema, etc.): Pt with bout of minimal urinary incontinence while ambulating.  RN notified.    Exercises        Assessment/Plan    PT Assessment Patient needs continued PT services  PT Diagnosis Difficulty walking;Generalized weakness   PT Problem List Decreased strength;Decreased activity tolerance;Decreased balance;Decreased mobility;Decreased knowledge of use of DME;Decreased knowledge of precautions  PT Treatment Interventions DME instruction;Gait training;Functional mobility training;Therapeutic activities;Therapeutic exercise;Balance training;Neuromuscular re-education;Patient/family education   PT Goals (Current goals can be found in the Care Plan section) Acute Rehab PT Goals Patient Stated Goal: Go home PT Goal Formulation: With patient Time For Goal Achievement: 04/23/14 Potential to Achieve Goals: Good    Frequency Min 3X/week   Barriers to discharge Decreased caregiver support pt lives alone     Co-evaluation               End of Session Equipment Utilized During Treatment: Gait belt Activity Tolerance: Patient tolerated treatment well Patient left: in chair;with call bell/phone within reach Nurse Communication: Mobility status;Other (comment) (urinary incontinence while ambulating)         Time: 5784-69621119-1146 PT Time Calculation (min): 27 min   Charges:   PT Evaluation $Initial PT Evaluation Tier I: 1 Procedure PT Treatments $Therapeutic Activity: 8-22 mins   PT G Codes:        Charlsie MerlesLogan Secor Cordarro [place], [place] [phone]   Berton MountBarbour, Asta Corbridge S 04/16/2014, 11:58 AM

## 2014-04-16 NOTE — Discharge Summary (Signed)
See full note this am. cdm 

## 2014-04-16 NOTE — Progress Notes (Signed)
Discharge home to [facility] independent living, transported by daughter. Home discharge instruction given to daughter no questions verbalized.

## 2014-04-16 NOTE — Progress Notes (Addendum)
     SUBJECTIVE: No chest pain or SOB  BP 143/51  Pulse 69  Temp(Src) 97.6 F (36.4 C) (Oral)  Resp 18  Ht 5' (1.524 m)  Wt 116 lb 4.8 oz (52.753 kg)  BMI 22.71 kg/m2  SpO2 96%  Intake/Output Summary (Last 24 hours) at 04/16/14 0756 Last data filed at 04/15/14 2100  Gross per 24 hour  Intake  582.5 ml  Output    700 ml  Net -117.5 ml    PHYSICAL EXAM General: Well developed, well nourished, in no acute distress. Alert and oriented x 3.  Psych:  Good affect, responds appropriately Neck: No JVD. No masses noted.  Lungs: Clear bilaterally with no wheezes or rhonci noted.  Heart: RRR with no murmurs noted. Abdomen: Bowel sounds are present. Soft, non-tender.  Extremities: No lower extremity edema.   LABS: Basic Metabolic Panel:  Recent Labs  16/03/9609/26/15 1654 04/14/14 0237  NA 143 143  K 3.9 3.6*  CL 106 105  CO2 22 25  GLUCOSE 104* 92  BUN 9 12  CREATININE 0.75 0.72  CALCIUM 9.4 9.1   CBC:  Recent Labs  04/15/14 0355 04/16/14 0357  WBC 5.7 5.6  HGB 11.9* 11.6*  HCT 36.1 36.3  MCV 90.0 90.1  PLT 202 193   Cardiac Enzymes:  Recent Labs  04/13/14 2253 04/14/14 0237 04/14/14 1026  TROPONINI <0.30 0.34* <0.30   Fasting Lipid Panel:  Recent Labs  04/14/14 0237  CHOL 190  HDL 91  LDLCALC 85  TRIG 71  CHOLHDL 2.1    Current Meds: . amitriptyline  50 mg Oral QHS  . amLODipine  5 mg Oral q morning - 10a  . aspirin EC  81 mg Oral Daily  . atorvastatin  40 mg Oral QHS  . carvedilol  3.125 mg Oral BID WC  . docusate sodium  300 mg Oral QHS  . fesoterodine  4 mg Oral Daily  . latanoprost  1 drop Both Eyes QHS  . lisinopril  40 mg Oral q morning - 10a  . pantoprazole  40 mg Oral Daily  . sodium chloride  3 mL Intravenous Q12H     ASSESSMENT AND PLAN:  1. Elevated troponin: Subtle elevation at 0.34 with next value less than 0.30. She has been followed in the CCU by Dr. Allyson SabalBerry over the last 2 days. Echo with normal LV function, moderate AS. No  plans for invasive workup. Pt is feeling better. No chest pain or SOB.   2. HTN: BP controlled.   3. HLD: Continue statin.   4. Moderate AS: Will follow  Dispo: Discharge home today. Follow up Dr. Rubie Maidroituro in 2-3 weeks. Pt's daughter Elita Quickam will need to be called at [phone] when she is ready to be picked up. I have spoken to her daughter on the phone and discussed the plan.     Isaih Bulger  10/29/20157:56 AM

## 2014-05-12 ENCOUNTER — Encounter: Payer: Self-pay | Admitting: Physician Assistant

## 2014-05-12 ENCOUNTER — Ambulatory Visit (INDEPENDENT_AMBULATORY_CARE_PROVIDER_SITE_OTHER): Payer: Medicare Other | Admitting: Physician Assistant

## 2014-05-12 VITALS — BP 150/70 | HR 80 | Ht 60.0 in | Wt 120.1 lb

## 2014-05-12 DIAGNOSIS — I350 Nonrheumatic aortic (valve) stenosis: Secondary | ICD-10-CM

## 2014-05-12 DIAGNOSIS — E785 Hyperlipidemia, unspecified: Secondary | ICD-10-CM

## 2014-05-12 DIAGNOSIS — I249 Acute ischemic heart disease, unspecified: Secondary | ICD-10-CM

## 2014-05-12 DIAGNOSIS — I10 Essential (primary) hypertension: Secondary | ICD-10-CM | POA: Insufficient documentation

## 2014-05-12 NOTE — Patient Instructions (Signed)
Your physician recommends that you schedule a follow-up appointment in: 3 MONTHS WITH DR CROITORU  

## 2014-05-12 NOTE — Assessment & Plan Note (Addendum)
Lipids are well-controlled. She is on a statin and was prior to admission.

## 2014-05-12 NOTE — Progress Notes (Signed)
Patient ID: Alyssa FoersterMarian Rayburn, female   DOB: April 24, 1918, 80 y.o.   MRN: [id number]     Date:  05/12/2014   ID:  Alyssa FoersterMarian Cullars, DOB April 24, 1918, MRN [id number]  PCP:  Neldon LabellaMILLER,LISA LYNN, MD  Primary Cardiologist:  Croitoru     History of Present Illness: Alyssa FoersterMarian Hixon is a 80 y.o. female with no prior history of coronary artery disease. She does have a history of a systolic murmur with moderate aortic stenosis noted on her most recent echo in February 2015. She lives in a retirement community and remains in independent living. She uses a walker to ambulate and does have a history of falls. Following a fall approximately 2 years ago, she broke her coccyx. She complained of dyspnea following that episode and was seen by Dr. Donnie Ahoilley with apparent normal cardiac workup. Since then, she has done reasonably well until a few months ago when she began to note dyspnea on exertion while making her bed. Despite this, she was not experiencing dyspnea on exertion when walking to the cafeteria of her retirement facility. A few weeks ago, she apparently had a fall after tripping over her ironing board. She did not express any loss of consciousness. She has been receiving physical therapy three days a week and has been doing fairly well. She was admitted from 04/13/2014 to 04/16/2014.  She had been expressing general malaise and reported to her daughter that she was feeling a little bit short of breath. She was seen by physical therapy and was noted to be short of breath and her blood pressure was checked and found to be elevated at 180/100. Oxygen saturations were reportedly lower than normal though we don't have exact figures. Her daughter was contacted by the physical therapist and subsequently they called her primary care provider who recommended evaluation in the emergency room.  Here, her ECG was felt to be abnormal and initially interventional cardiology was called to evaluate for possible ST elevation MI.  Following evaluation, it was not felt to represent an ST elevation MI. Further emergency room evaluation has revealed a mild elevation of her troponin 0.38 and mild elevation of BNP to 793. Chest x-ray has shown no acute pulmonary pulmonary abnormality. She was admitted for observation. A beta blocker was added due to elevated troponin and for better BP control. Amlopdipine and ACE continued. A 2D echo revealed normal LV function, moderate AS. Follow up troponin was normal. No plans for invasive workup.   Patient presents today for posthospital evaluation with her daughter.  She reports feeling much better. She checks her weight twice daily. Her blood pressure is checked regularly and runs around 130/80.  She currently denies nausea, vomiting, fever, chest pain, shortness of breath, orthopnea, dizziness, PND, cough, congestion, abdominal pain, hematochezia, melena, lower extremity edema, claudication.  Wt Readings from Last 3 Encounters:  05/12/14 120 lb 1.6 oz (54.477 kg)  04/16/14 116 lb 4.8 oz (52.753 kg)  11/05/12 110 lb (49.896 kg)    Lipid Panel     Component Value Date/Time   CHOL 190 04/14/2014 0237   TRIG 71 04/14/2014 0237   HDL 91 04/14/2014 0237   CHOLHDL 2.1 04/14/2014 0237   VLDL 14 04/14/2014 0237   LDLCALC 85 04/14/2014 0237     Past Medical History  Diagnosis Date  . Hypertension   . High cholesterol   . Back pain   . Macular degeneration   . GERD (gastroesophageal reflux disease)   . OA (osteoarthritis)   . Osteoporosis   .  DJD (degenerative joint disease) of cervical spine   . Moderate aortic stenosis     a. 07/2013 Echo: EF 60-65%, Gr 1 DD, mild AI, mod AS, tirv MR, mildly dil LA.  . Falls     a. uses walker, PT 3x/wk. Last fall 03/2014.    Current Outpatient Prescriptions  Medication Sig Dispense Refill  . acetaminophen (TYLENOL) 650 MG CR tablet Take 650 mg by mouth every 6 (six) hours as needed for pain.    Marland Kitchen alendronate (FOSAMAX) 70 MG  tablet Take 70 mg by mouth every 7 (seven) days. Take with a full glass of water on an empty stomach. Take on Saturdays    . amitriptyline (ELAVIL) 50 MG tablet Take 50 mg by mouth at bedtime.    Marland Kitchen amLODipine (NORVASC) 5 MG tablet Take 5 mg by mouth every morning.     Marland Kitchen aspirin EC 81 MG EC tablet Take 1 tablet (81 mg total) by mouth daily.    Marland Kitchen atorvastatin (LIPITOR) 40 MG tablet Take 40 mg by mouth at bedtime.     . carvedilol (COREG) 3.125 MG tablet Take 1 tablet (3.125 mg total) by mouth 2 (two) times daily with a meal. 60 tablet 5  . Cholecalciferol (VITAMIN D-3) 1000 UNITS CAPS Take 1 capsule by mouth daily.    Marland Kitchen docusate sodium (COLACE) 100 MG capsule Take 300 mg by mouth at bedtime.    . lansoprazole (PREVACID) 30 MG capsule Take 30 mg by mouth every other day.     . latanoprost (XALATAN) 0.005 % ophthalmic solution Place 1 drop into both eyes at bedtime.    Marland Kitchen lisinopril (PRINIVIL,ZESTRIL) 40 MG tablet Take 40 mg by mouth every morning.     . tolterodine (DETROL LA) 2 MG 24 hr capsule Take 2 mg by mouth every morning.     No current facility-administered medications for this visit.    Allergies:    Allergies  Allergen Reactions  . Codeine Nausea And Vomiting    Social History:  The patient  reports that she has never smoked. She does not have any smokeless tobacco history on file. She reports that she does not drink alcohol or use illicit drugs.   Family history:   Family History  Problem Relation Age of Onset  . Heart attack Mother   . Heart attack Father     ROS:  Please see the history of present illness.  All other systems reviewed and negative.   PHYSICAL EXAM: VS:  BP 150/70 mmHg  Pulse 80  Ht 5' (1.524 m)  Wt 120 lb 1.6 oz (54.477 kg)  BMI 23.46 kg/m2 Well nourished, well developed, in no acute distress HEENT: Pupils are equal round react to light accommodation extraocular movements are intact.  Cardiac: Regular rate and rhythm 1/6 systolic murmur. Lungs:   clear to auscultation bilaterally, no wheezing, rhonchi or rales Abd: soft, nontender, positive bowel sounds all quadrants, no hepatosplenomegaly Ext: no lower extremity edema.  2+ radial and dorsalis pedis pulses. Skin: warm and dry Neuro:  Grossly normal     ASSESSMENT AND PLAN:  Problem List Items Addressed This Visit    Aortic stenosis, moderate    Patient is euvolemic. She monitors her weight twice daily with her talking scale.      Essential hypertension - Primary    Blood pressure mildly elevated here in the office however, patient says her blood pressure is usually around 130 systolic. It is checked regularly.    Hyperlipidemia  Lipids are well-controlled. She is on a statin and was prior to admission.      Follow-up in 3 months

## 2014-05-12 NOTE — Assessment & Plan Note (Signed)
Patient is euvolemic. She monitors her weight twice daily with her talking scale.

## 2014-05-12 NOTE — Assessment & Plan Note (Signed)
Blood pressure mildly elevated here in the office however, patient says her blood pressure is usually around 130 systolic. It is checked regularly.

## 2014-05-31 ENCOUNTER — Emergency Department (HOSPITAL_COMMUNITY)
Admission: EM | Admit: 2014-05-31 | Discharge: 2014-05-31 | Disposition: A | Discharge status: Home / Self Care | Payer: Medicare Other | Attending: Emergency Medicine | Admitting: Emergency Medicine

## 2014-05-31 ENCOUNTER — Encounter (HOSPITAL_COMMUNITY): Payer: Self-pay | Admitting: *Deleted

## 2014-05-31 ENCOUNTER — Emergency Department (HOSPITAL_COMMUNITY): Payer: Medicare Other

## 2014-05-31 ENCOUNTER — Other Ambulatory Visit: Payer: Self-pay

## 2014-05-31 DIAGNOSIS — K219 Gastro-esophageal reflux disease without esophagitis: Secondary | ICD-10-CM | POA: Diagnosis not present

## 2014-05-31 DIAGNOSIS — Z9181 History of falling: Secondary | ICD-10-CM | POA: Diagnosis not present

## 2014-05-31 DIAGNOSIS — M1990 Unspecified osteoarthritis, unspecified site: Secondary | ICD-10-CM | POA: Diagnosis not present

## 2014-05-31 DIAGNOSIS — E780 Pure hypercholesterolemia: Secondary | ICD-10-CM | POA: Diagnosis not present

## 2014-05-31 DIAGNOSIS — I10 Essential (primary) hypertension: Secondary | ICD-10-CM | POA: Insufficient documentation

## 2014-05-31 DIAGNOSIS — Z7982 Long term (current) use of aspirin: Secondary | ICD-10-CM | POA: Diagnosis not present

## 2014-05-31 DIAGNOSIS — Z79899 Other long term (current) drug therapy: Secondary | ICD-10-CM | POA: Insufficient documentation

## 2014-05-31 DIAGNOSIS — M47812 Spondylosis without myelopathy or radiculopathy, cervical region: Secondary | ICD-10-CM | POA: Diagnosis not present

## 2014-05-31 DIAGNOSIS — G4489 Other headache syndrome: Secondary | ICD-10-CM | POA: Diagnosis not present

## 2014-05-31 DIAGNOSIS — R51 Headache: Secondary | ICD-10-CM | POA: Diagnosis present

## 2014-05-31 DIAGNOSIS — Z8669 Personal history of other diseases of the nervous system and sense organs: Secondary | ICD-10-CM | POA: Insufficient documentation

## 2014-05-31 LAB — CBC WITH DIFFERENTIAL/PLATELET
BASOS ABS: 0 10*3/uL (ref 0.0–0.1)
Basophils Relative: 1 % (ref 0–1)
Eosinophils Absolute: 0.1 10*3/uL (ref 0.0–0.7)
Eosinophils Relative: 2 % (ref 0–5)
HCT: 38.8 % (ref 36.0–46.0)
Hemoglobin: 12.7 g/dL (ref 12.0–15.0)
Lymphocytes Relative: 19 % (ref 12–46)
Lymphs Abs: 1.3 10*3/uL (ref 0.7–4.0)
MCH: 29.5 pg (ref 26.0–34.0)
MCHC: 32.7 g/dL (ref 30.0–36.0)
MCV: 90.2 fL (ref 78.0–100.0)
Monocytes Absolute: 0.6 10*3/uL (ref 0.1–1.0)
Monocytes Relative: 8 % (ref 3–12)
NEUTROS ABS: 5 10*3/uL (ref 1.7–7.7)
NEUTROS PCT: 70 % (ref 43–77)
Platelets: 182 10*3/uL (ref 150–400)
RBC: 4.3 MIL/uL (ref 3.87–5.11)
RDW: 14 % (ref 11.5–15.5)
WBC: 7.1 10*3/uL (ref 4.0–10.5)

## 2014-05-31 LAB — I-STAT TROPONIN, ED: TROPONIN I, POC: 0.01 ng/mL (ref 0.00–0.08)

## 2014-05-31 LAB — I-STAT CHEM 8, ED
BUN: 13 mg/dL (ref 6–23)
CHLORIDE: 108 meq/L (ref 96–112)
Calcium, Ion: 1.15 mmol/L (ref 1.13–1.30)
Creatinine, Ser: 0.8 mg/dL (ref 0.50–1.10)
Glucose, Bld: 105 mg/dL — ABNORMAL HIGH (ref 70–99)
HEMATOCRIT: 39 % (ref 36.0–46.0)
Hemoglobin: 13.3 g/dL (ref 12.0–15.0)
Potassium: 3.9 mEq/L (ref 3.7–5.3)
Sodium: 139 mEq/L (ref 137–147)
TCO2: 22 mmol/L (ref 0–100)

## 2014-05-31 LAB — I-STAT CG4 LACTIC ACID, ED: LACTIC ACID, VENOUS: 0.92 mmol/L (ref 0.5–2.2)

## 2014-05-31 MED ORDER — PREDNISONE 20 MG PO TABS: 20.0000 mg | ORAL_TABLET | Freq: Two times a day (BID) | ORAL | Status: DC

## 2014-05-31 NOTE — ED Notes (Signed)
pts family states that she has had headache and tenderness to palpation behind her rt ear. Pt also reported to have generalized weakness. [place]

## 2014-05-31 NOTE — ED Notes (Signed)
Pt ambulates with walker states feeling better. Alert x4 respirations easy

## 2014-05-31 NOTE — Discharge Instructions (Signed)
Your headache is likely related to the arthritis in your neck. For pain, use Tylenol every 4 hours. Take the prescription for prednisone to help with inflammation. Try using heat on the sore area 3 or 4 times a day.    General Headache Without Cause A headache is pain or discomfort felt around the head or neck area. The specific cause of a headache may not be found. There are many causes and types of headaches. A few common ones are:  Tension headaches.  Migraine headaches.  Cluster headaches.  Chronic daily headaches. HOME CARE INSTRUCTIONS   Keep all follow-up appointments with your caregiver or any specialist referral.  Only take over-the-counter or prescription medicines for pain or discomfort as directed by your caregiver.  Lie down in a dark, quiet room when you have a headache.  Keep a headache journal to find out what may trigger your migraine headaches. For example, write down:  What you eat and drink.  How much sleep you get.  Any change to your diet or medicines.  Try massage or other relaxation techniques.  Put ice packs or heat on the head and neck. Use these 3 to 4 times per day for 15 to 20 minutes each time, or as needed.  Limit stress.  Sit up straight, and do not tense your muscles.  Quit smoking if you smoke.  Limit alcohol use.  Decrease the amount of caffeine you drink, or stop drinking caffeine.  Eat and sleep on a regular schedule.  Get 7 to 9 hours of sleep, or as recommended by your caregiver.  Keep lights dim if bright lights bother you and make your headaches worse. SEEK MEDICAL CARE IF:   You have problems with the medicines you were prescribed.  Your medicines are not working.  You have a change from the usual headache.  You have nausea or vomiting. SEEK IMMEDIATE MEDICAL CARE IF:   Your headache becomes severe.  You have a fever.  You have a stiff neck.  You have loss of vision.  You have muscular weakness or loss of  muscle control.  You start losing your balance or have trouble walking.  You feel faint or pass out.  You have severe symptoms that are different from your first symptoms. MAKE SURE YOU:   Understand these instructions.  Will watch your condition.  Will get help right away if you are not doing well or get worse. Document Released: 06/05/2005 Document Revised: 08/28/2011 Document Reviewed: 06/21/2011 [facility] Patient Information 2015 [place], [place]. This information is not intended to replace advice given to you by your health care provider. Make sure you discuss any questions you have with your health care provider.

## 2014-05-31 NOTE — ED Provider Notes (Signed)
CSN: [id number]     Arrival date & time 05/31/14  1642 History   First MD Initiated Contact with Patient 05/31/14 1710     Chief Complaint  Patient presents with  . Headache  . Weakness     (Consider location/radiation/quality/duration/timing/severity/associated sxs/prior Treatment) HPI  Alyssa Cummings is a 80 y.o. female who presents for evaluation of headache that has been present for 2 days, without trauma.  Headache is mild, and improves when she takes Tylenol.  She denies fever, chills, chest pain, weakness or dizziness.  No prior neck injury.  No chronic neck pain.  She has a dry nonproductive cough.  She denies shortness of breath.  She is taking usual medications.  There are no other known modifying factors.   Past Medical History  Diagnosis Date  . Hypertension   . High cholesterol   . Back pain   . Macular degeneration   . GERD (gastroesophageal reflux disease)   . OA (osteoarthritis)   . Osteoporosis   . DJD (degenerative joint disease) of cervical spine   . Moderate aortic stenosis     a. 07/2013 Echo: EF 60-65%, Gr 1 DD, mild AI, mod AS, tirv MR, mildly dil LA.  . Falls     a. uses walker, PT 3x/wk. Last fall 03/2014.   Past Surgical History  Procedure Laterality Date  . Cholecystectomy    . Back surgery    . Breast surgery     Family History  Problem Relation Age of Onset  . Heart attack Mother   . Heart attack Father    History  Substance Use Topics  . Smoking status: Never Smoker   . Smokeless tobacco: Not on file  . Alcohol Use: No   OB History    No data available     Review of Systems  All other systems reviewed and are negative.     Allergies  Codeine  Home Medications   Prior to Admission medications   Medication Sig Start Date End Date Taking? Authorizing Provider  acetaminophen (TYLENOL) 650 MG CR tablet Take 650 mg by mouth every 6 (six) hours as needed for pain.   Yes Historical Provider, MD  alendronate (FOSAMAX) 70 MG tablet  Take 70 mg by mouth every 7 (seven) days. Take with a full glass of water on an empty stomach. Take on Saturdays   Yes Historical Provider, MD  amitriptyline (ELAVIL) 50 MG tablet Take 50 mg by mouth at bedtime.   Yes Historical Provider, MD  amLODipine (NORVASC) 5 MG tablet Take 5 mg by mouth every morning.    Yes Historical Provider, MD  aspirin EC 81 MG EC tablet Take 1 tablet (81 mg total) by mouth daily. 04/16/14  Yes Dwana Melena, PA-C  atorvastatin (LIPITOR) 40 MG tablet Take 40 mg by mouth at bedtime.    Yes Historical Provider, MD  carvedilol (COREG) 3.125 MG tablet Take 1 tablet (3.125 mg total) by mouth 2 (two) times daily with a meal. 04/16/14  Yes Dwana Melena, PA-C  Cholecalciferol (VITAMIN D-3) 1000 UNITS CAPS Take 1 capsule by mouth daily.   Yes Historical Provider, MD  docusate sodium (COLACE) 100 MG capsule Take 300 mg by mouth at bedtime.   Yes Historical Provider, MD  lansoprazole (PREVACID) 30 MG capsule Take 30 mg by mouth every other day.    Yes Historical Provider, MD  latanoprost (XALATAN) 0.005 % ophthalmic solution Place 1 drop into both eyes at bedtime.   Yes Historical  Provider, MD  lisinopril (PRINIVIL,ZESTRIL) 40 MG tablet Take 40 mg by mouth every morning.    Yes Historical Provider, MD  Multiple Vitamins-Minerals (CENTRUM CARDIO) TABS Take 1 tablet by mouth 2 (two) times daily.   Yes Historical Provider, MD  tolterodine (DETROL LA) 2 MG 24 hr capsule Take 2 mg by mouth every morning.   Yes Historical Provider, MD  predniSONE (DELTASONE) 20 MG tablet Take 1 tablet (20 mg total) by mouth 2 (two) times daily. 05/31/14   Flint Melter, MD   BP 179/64 mmHg  Pulse 86  Temp(Src) 98.4 F (36.9 C) (Rectal)  Resp 17  Wt 120 lb (54.432 kg)  SpO2 100% Physical Exam  Constitutional: She is oriented to person, place, and time. She appears well-developed.  Elderly, frail  HENT:  Head: Normocephalic and atraumatic.  Right Ear: External ear normal.  Left Ear: External  ear normal.  Mild tenderness right occiput, without associated deformity, or overlying skin change.  Eyes: Conjunctivae and EOM are normal. Pupils are equal, round, and reactive to light.  Neck: Normal range of motion and phonation normal. Neck supple. No thyromegaly present.  No meningismus  Cardiovascular: Normal rate, regular rhythm and normal heart sounds.   Pulmonary/Chest: Effort normal and breath sounds normal. She exhibits no bony tenderness.  Abdominal: Soft. There is no tenderness.  Musculoskeletal: Normal range of motion.  Normal range of motion, neck, upper and lower back.  Neurological: She is alert and oriented to person, place, and time. No cranial nerve deficit or sensory deficit. She exhibits normal muscle tone. Coordination normal.  Skin: Skin is warm, dry and intact. No rash noted. No erythema.  Psychiatric: She has a normal mood and affect. Her behavior is normal. Judgment and thought content normal.  Nursing note and vitals reviewed.   ED Course  Procedures (including critical care time)  Medications - No data to display  Patient Vitals for the past 24 hrs:  BP Temp Temp src Pulse Resp SpO2 Weight  05/31/14 2030 179/64 mmHg - - 86 17 100 % -  05/31/14 2000 172/70 mmHg - - 78 18 97 % -  05/31/14 1938 182/56 mmHg - - 86 13 98 % -  05/31/14 1841 - 98.4 F (36.9 C) Rectal - - - -  05/31/14 1800 179/73 mmHg - - 82 21 99 % -  05/31/14 1730 (!) 161/126 mmHg - - 80 22 92 % -  05/31/14 1715 182/77 mmHg - - 81 16 100 % -  05/31/14 1657 182/93 mmHg 98 F (36.7 C) Axillary 90 (!) 28 100 % 120 lb (54.432 kg)    At D/C Reevaluation with update and discussion. After initial assessment and treatment, an updated evaluation reveals no change in clinical status. [name] L    Labs Review Labs Reviewed  I-STAT CHEM 8, ED - Abnormal; Notable for the following:    Glucose, Bld 105 (*)    All other components within normal limits  CULTURE, BLOOD (ROUTINE X 2)  CULTURE,  BLOOD (ROUTINE X 2)  CBC WITH DIFFERENTIAL  I-STAT CG4 LACTIC ACID, ED  I-STAT TROPOININ, ED    Imaging Review Ct Head Wo Contrast  05/31/2014   CLINICAL DATA:  Headache and tenderness to palpation behind the right ear. Generalized weakness.  EXAM: CT HEAD WITHOUT CONTRAST  CT CERVICAL SPINE WITHOUT CONTRAST  TECHNIQUE: Multidetector CT imaging of the head and cervical spine was performed following the standard protocol without intravenous contrast. Multiplanar CT image reconstructions of the  cervical spine were also generated.  COMPARISON:  Head CT 03/03/2014. Head and cervical spine CT 09/23/2012.  FINDINGS: CT HEAD FINDINGS  There is a 1.0 cm oval soft tissue lesion along the left aspect of the tentorium with an associated or adjacent punctate focus of calcification. This has not significantly changed in size from the 09/23/2012 study. Chronic right caudate lacunar infarct is again seen. Cerebral atrophy is unchanged. Confluent hypodensities throughout the cerebral white matter do not appear significantly changed and are nonspecific but compatible with extensive chronic small vessel ischemic disease. There is no definite evidence of acute large territory infarct, intracranial hemorrhage, mass, midline shift, or extra-axial fluid collection.  Prior bilateral cataract extraction is noted. Right parietal scalp hematoma on the prior study has resolved. No acute skull fracture. Paranasal sinuses and mastoid air cells are clear. Mild-to-moderate carotid siphon calcification.  CT CERVICAL SPINE FINDINGS  Bones are diffusely osteopenic. Vertebral alignment is unchanged with minimal anterolisthesis of C5 on C6. Moderate to severe disc space narrowing is again seen at C4-5 with relatively diffuse, moderate to severe facet arthrosis noted, right worse than left. Vertebral body heights are preserved. No acute fracture is identified.  Large, hypoattenuating right thyroid nodule was scattered internal calcification  has not significantly changed in size, measuring approximately 4.1 x 3.2 cm with mild mass effect on the trachea. Extensive aortic arch calcification is noted, with heavy calcification at the right carotid bifurcation as well.  IMPRESSION: 1. No evidence of acute intracranial abnormality. 2. Extensive chronic small vessel ischemic disease. 3. 1 cm mass along the left tentorium, not significantly changed from 2014 and favored to represent a small, incidental meningioma. If clinically warranted, nonemergent brain MRI without and with contrast could be performed for further evaluation. 4. No acute osseous abnormality identified in the cervical spine. 5. Advanced multilevel cervical facet arthrosis. 6. Right thyroid mass, not significantly changed in size.   Electronically Signed   By: Sebastian AcheAllen  Grady   On: 05/31/2014 19:42   Ct Cervical Spine Wo Contrast  05/31/2014   CLINICAL DATA:  Headache and tenderness to palpation behind the right ear. Generalized weakness.  EXAM: CT HEAD WITHOUT CONTRAST  CT CERVICAL SPINE WITHOUT CONTRAST  TECHNIQUE: Multidetector CT imaging of the head and cervical spine was performed following the standard protocol without intravenous contrast. Multiplanar CT image reconstructions of the cervical spine were also generated.  COMPARISON:  Head CT 03/03/2014. Head and cervical spine CT 09/23/2012.  FINDINGS: CT HEAD FINDINGS  There is a 1.0 cm oval soft tissue lesion along the left aspect of the tentorium with an associated or adjacent punctate focus of calcification. This has not significantly changed in size from the 09/23/2012 study. Chronic right caudate lacunar infarct is again seen. Cerebral atrophy is unchanged. Confluent hypodensities throughout the cerebral white matter do not appear significantly changed and are nonspecific but compatible with extensive chronic small vessel ischemic disease. There is no definite evidence of acute large territory infarct, intracranial hemorrhage, mass,  midline shift, or extra-axial fluid collection.  Prior bilateral cataract extraction is noted. Right parietal scalp hematoma on the prior study has resolved. No acute skull fracture. Paranasal sinuses and mastoid air cells are clear. Mild-to-moderate carotid siphon calcification.  CT CERVICAL SPINE FINDINGS  Bones are diffusely osteopenic. Vertebral alignment is unchanged with minimal anterolisthesis of C5 on C6. Moderate to severe disc space narrowing is again seen at C4-5 with relatively diffuse, moderate to severe facet arthrosis noted, right worse than left. Vertebral body heights  are preserved. No acute fracture is identified.  Large, hypoattenuating right thyroid nodule was scattered internal calcification has not significantly changed in size, measuring approximately 4.1 x 3.2 cm with mild mass effect on the trachea. Extensive aortic arch calcification is noted, with heavy calcification at the right carotid bifurcation as well.  IMPRESSION: 1. No evidence of acute intracranial abnormality. 2. Extensive chronic small vessel ischemic disease. 3. 1 cm mass along the left tentorium, not significantly changed from 2014 and favored to represent a small, incidental meningioma. If clinically warranted, nonemergent brain MRI without and with contrast could be performed for further evaluation. 4. No acute osseous abnormality identified in the cervical spine. 5. Advanced multilevel cervical facet arthrosis. 6. Right thyroid mass, not significantly changed in size.   Electronically Signed   By: Sebastian AcheAllen  Grady   On: 05/31/2014 19:42     EKG Interpretation   Date/Time:  Sunday May 31 2014 19:39:05 EST Ventricular Rate:  86 PR Interval:  209 QRS Duration: 81 QT Interval:  390 QTC Calculation: 466 R Axis:   -26 Text Interpretation:  Sinus rhythm Probable left atrial enlargement  Abnormal R-wave progression, early transition Probable left ventricular  hypertrophy Since last tracing of earlier today PR and QT  intervals are  now normal Confirmed by Dniyah Grant  MD, Earvin Blazier [phone]([id number]) on 05/31/2014 8:32:45  PM         Date: 05/31/14   Rate: 82  Rhythm: atrial fibrillation  QRS Axis: normal  PR and QT Intervals: Normal QT  ST/T Wave abnormalities: nonspecific ST changes  PR and QRS Conduction Disutrbances:none  Narrative Interpretation:   Old EKG Reviewed: possible new Atrial Fibrillation. Artifact is present, will repeat.   MDM   Final diagnoses:  Other headache syndrome  Degenerative joint disease of cervical spine    Nonspecific headache, with degenerative joint disease. Doubt meningitis, fracture, CVA.  Nursing Notes Reviewed/ Care Coordinated Applicable Imaging Reviewed Interpretation of Laboratory Data incorporated into ED treatment  The patient appears reasonably screened and/or stabilized for discharge and I doubt any other medical condition or other [facility] requiring further screening, evaluation, or treatment in the ED at this time prior to discharge.  Plan: Home Medications- Prednisone; Home Treatments- rest, heat; return here if the recommended treatment, does not improve the symptoms; Recommended follow up- PCP prn     Flint MelterElliott L Anacleto Batterman, MD 05/31/14 2351

## 2014-05-31 NOTE — ED Notes (Signed)
Pt c/o "knots" behind r ear. No palpable deformities noted. Pt does c/o tenderness on touch.

## 2014-06-07 LAB — CULTURE, BLOOD (ROUTINE X 2)
CULTURE: NO GROWTH
Culture: NO GROWTH

## 2014-08-13 ENCOUNTER — Encounter: Payer: Self-pay | Admitting: Cardiovascular Disease

## 2014-08-13 ENCOUNTER — Ambulatory Visit (INDEPENDENT_AMBULATORY_CARE_PROVIDER_SITE_OTHER): Payer: Medicare Other | Admitting: Cardiovascular Disease

## 2014-08-13 VITALS — BP 142/68 | HR 60 | Resp 16 | Ht 60.0 in | Wt 119.3 lb

## 2014-08-13 DIAGNOSIS — E785 Hyperlipidemia, unspecified: Secondary | ICD-10-CM

## 2014-08-13 DIAGNOSIS — I10 Essential (primary) hypertension: Secondary | ICD-10-CM

## 2014-08-13 DIAGNOSIS — I350 Nonrheumatic aortic (valve) stenosis: Secondary | ICD-10-CM

## 2014-08-13 NOTE — Patient Instructions (Signed)
NO CHANGE IN MEDICATIONS  Your physician wants you to follow-up in 12 month Dr Royann Shiversroitoru.  You will receive a reminder letter in the mail two months in advance. If you don't receive a letter, please call our office to schedule the follow-up appointment.

## 2014-08-15 NOTE — Progress Notes (Signed)
Patient ID: Alyssa Cummings, female   DOB: 1917-07-26, 81 y.o.   MRN: [id number]     Reason for office visit Aortic stenosis  Alyssa Cummings has not had new cardiac problems since her hospitalization with HTN, diastolic heart failure in October 2015. She continues to live in the Independent Living section of [place] and just celebrated her 89th birthday. She uses a walker, but moves at a pretty brisk pace. No new falls. No complaints of exertional dyspnea, chest discomfort or syncope.  She has moderate AS: mean gradient 33 mm Hg, DOI 0.24, heavily calcified valve. Normal LV function.  She expresses little interest in invasive diagnostic or therapeutic interventions.  Her only complaint is a very dry mouth (possibly related to tolterodine or amitriptyline?).  Allergies  Allergen Reactions  . Codeine Nausea And Vomiting    Current Outpatient Prescriptions  Medication Sig Dispense Refill  . acetaminophen (TYLENOL) 650 MG CR tablet Take 650 mg by mouth every 6 (six) hours as needed for pain.    Marland Kitchen alendronate (FOSAMAX) 70 MG tablet Take 70 mg by mouth every 7 (seven) days. Take with a full glass of water on an empty stomach. Take on Saturdays    . amitriptyline (ELAVIL) 50 MG tablet Take 50 mg by mouth at bedtime.    Marland Kitchen amLODipine (NORVASC) 5 MG tablet Take 5 mg by mouth every morning.     Marland Kitchen aspirin EC 81 MG EC tablet Take 1 tablet (81 mg total) by mouth daily.    Marland Kitchen atorvastatin (LIPITOR) 40 MG tablet Take 40 mg by mouth at bedtime.     . carvedilol (COREG) 3.125 MG tablet Take 1 tablet (3.125 mg total) by mouth 2 (two) times daily with a meal. 60 tablet 5  . Cholecalciferol (VITAMIN D-3) 1000 UNITS CAPS Take 1 capsule by mouth daily.    Marland Kitchen docusate sodium (COLACE) 100 MG capsule Take 300 mg by mouth at bedtime.    . lansoprazole (PREVACID) 30 MG capsule Take 30 mg by mouth every other day.     . latanoprost (XALATAN) 0.005 % ophthalmic solution Place 1 drop into both eyes at bedtime.    Marland Kitchen  lisinopril (PRINIVIL,ZESTRIL) 40 MG tablet Take 40 mg by mouth every morning.     . tolterodine (DETROL LA) 2 MG 24 hr capsule Take 2 mg by mouth every morning.     No current facility-administered medications for this visit.    Past Medical History  Diagnosis Date  . Hypertension   . High cholesterol   . Back pain   . Macular degeneration   . GERD (gastroesophageal reflux disease)   . OA (osteoarthritis)   . Osteoporosis   . DJD (degenerative joint disease) of cervical spine   . Moderate aortic stenosis     a. 07/2013 Echo: EF 60-65%, Gr 1 DD, mild AI, mod AS, tirv MR, mildly dil LA.  . Falls     a. uses walker, PT 3x/wk. Last fall 03/2014.    Past Surgical History  Procedure Laterality Date  . Cholecystectomy    . Back surgery    . Breast surgery      Family History  Problem Relation Age of Onset  . Heart attack Mother   . Heart attack Father     History   Social History  . Marital Status: Widowed    Spouse Name: [name]  . Number of Children: [name]  . Years of Education: [name]   Occupational History  . Not on file.  Social History Main Topics  . Smoking status: Never Smoker   . Smokeless tobacco: Not on file  . Alcohol Use: No  . Drug Use: No  . Sexual Activity: Not on file   Other Topics Concern  . Not on file   Social History Narrative    Review of systems: The patient specifically denies any chest pain at rest exertion, dyspnea at rest or with exertion, orthopnea, paroxysmal nocturnal dyspnea, syncope, palpitations, focal neurological deficits, intermittent claudication, lower extremity edema, unexplained weight gain, cough, hemoptysis or wheezing.  PHYSICAL EXAM BP 142/68 mmHg  Pulse 60  Resp 16  Ht 5' (1.524 m)  Wt 119 lb 4.8 oz (54.114 kg)  BMI 23.30 kg/m2  General: Alert, oriented x3, no distress Head: no evidence of trauma, PERRL, EOMI, no exophtalmos or lid lag, no myxedema, no xanthelasma; normal ears, nose and oropharynx Neck: normal  jugular venous pulsations and no hepatojugular reflux; brisk carotid pulses without delay and loud bilateral radiated carotid bruits Chest: clear to auscultation, no signs of consolidation by percussion or palpation, normal fremitus, symmetrical and full respiratory excursions Cardiovascular: normal position and quality of the apical impulse, regular rhythm, normal first and distinct second heart sounds, 2/6 early to mid peaking aortic ejection murmur, no diastolic murmurs, rubs or gallops Abdomen: no tenderness or distention, no masses by palpation, no abnormal pulsatility or arterial bruits, normal bowel sounds, no hepatosplenomegaly Extremities: no clubbing, cyanosis or edema; 2+ radial, ulnar and brachial pulses bilaterally; 2+ right femoral, posterior tibial and dorsalis pedis pulses; 2+ left femoral, posterior tibial and dorsalis pedis pulses; no subclavian or femoral bruits Neurological: grossly nonfocal   EKG: NSR, no convincing evidence for LVH  Lipid Panel     Component Value Date/Time   CHOL 190 04/14/2014 0237   TRIG 71 04/14/2014 0237   HDL 91 04/14/2014 0237   CHOLHDL 2.1 04/14/2014 0237   VLDL 14 04/14/2014 0237   LDLCALC 85 04/14/2014 0237    BMET    Component Value Date/Time   NA 139 05/31/2014 1832   K 3.9 05/31/2014 1832   CL 108 05/31/2014 1832   CO2 25 04/14/2014 0237   GLUCOSE 105* 05/31/2014 1832   BUN 13 05/31/2014 1832   CREATININE 0.80 05/31/2014 1832   CALCIUM 9.1 04/14/2014 0237   GFRNONAA 71* 04/14/2014 0237   GFRAA 83* 04/14/2014 0237     ASSESSMENT AND PLAN  Fair control of BP and lipids. Exam supports moderate (non critical) aortic stenosis. The echo appearance of severe calcification raises the likely possibility of rapid progression of stenosis. At least at this point she feels inclined to pursue a conservative care approach. Asked her to call if she develops exertional angina/dyspnea/near-syncope. Discuss trial off tolterodine and elavil for  her mouth dryness. Patient Instructions  NO CHANGE IN MEDICATIONS  Your physician wants you to follow-up in 12 month Dr Royann Shiversroitoru.  You will receive a reminder letter in the mail two months in advance. If you don't receive a letter, please call our office to schedule the follow-up appointment.     Alyssa Cummings,Alyssa Cummings  Raynell Upton, MD, [facility] [phone] office [phone] pager

## 2014-10-28 ENCOUNTER — Telehealth: Payer: Self-pay | Admitting: Cardiovascular Disease

## 2014-10-28 MED ORDER — CARVEDILOL 3.125 MG PO TABS: 3.1250 mg | ORAL_TABLET | Freq: Two times a day (BID) | ORAL | Status: DC

## 2014-10-28 NOTE — Telephone Encounter (Signed)
Refill submitted to patient's preferred pharmacy. Informed patient via VM message.

## 2014-10-28 NOTE — Telephone Encounter (Signed)
°  1. Which medications need to be refilled? Carvedilol  2. Which pharmacy is medication to be sent to[facility] [address] [facility] [place]   3. Do they need a 30 day or 90 day supply? 90  4. Would they like a call back once the medication has been sent to the pharmacy? Yes

## 2014-12-09 ENCOUNTER — Emergency Department (HOSPITAL_COMMUNITY): Payer: Medicare Other

## 2014-12-09 ENCOUNTER — Emergency Department (HOSPITAL_COMMUNITY)
Admission: EM | Admit: 2014-12-09 | Discharge: 2014-12-09 | Disposition: A | Discharge status: Home / Self Care | Payer: Medicare Other | Attending: Emergency Medicine | Admitting: Emergency Medicine

## 2014-12-09 ENCOUNTER — Encounter (HOSPITAL_COMMUNITY): Payer: Self-pay

## 2014-12-09 DIAGNOSIS — K219 Gastro-esophageal reflux disease without esophagitis: Secondary | ICD-10-CM | POA: Diagnosis not present

## 2014-12-09 DIAGNOSIS — H539 Unspecified visual disturbance: Secondary | ICD-10-CM | POA: Insufficient documentation

## 2014-12-09 DIAGNOSIS — R110 Nausea: Secondary | ICD-10-CM | POA: Diagnosis not present

## 2014-12-09 DIAGNOSIS — I10 Essential (primary) hypertension: Secondary | ICD-10-CM | POA: Diagnosis not present

## 2014-12-09 DIAGNOSIS — R42 Dizziness and giddiness: Secondary | ICD-10-CM | POA: Insufficient documentation

## 2014-12-09 DIAGNOSIS — E780 Pure hypercholesterolemia: Secondary | ICD-10-CM | POA: Diagnosis not present

## 2014-12-09 DIAGNOSIS — Z792 Long term (current) use of antibiotics: Secondary | ICD-10-CM | POA: Insufficient documentation

## 2014-12-09 DIAGNOSIS — Z79899 Other long term (current) drug therapy: Secondary | ICD-10-CM | POA: Diagnosis not present

## 2014-12-09 DIAGNOSIS — H9190 Unspecified hearing loss, unspecified ear: Secondary | ICD-10-CM | POA: Diagnosis not present

## 2014-12-09 DIAGNOSIS — H3530 Unspecified macular degeneration: Secondary | ICD-10-CM | POA: Insufficient documentation

## 2014-12-09 DIAGNOSIS — M1990 Unspecified osteoarthritis, unspecified site: Secondary | ICD-10-CM | POA: Diagnosis not present

## 2014-12-09 LAB — BASIC METABOLIC PANEL
ANION GAP: 8 (ref 5–15)
BUN: 10 mg/dL (ref 6–20)
CHLORIDE: 105 mmol/L (ref 101–111)
CO2: 28 mmol/L (ref 22–32)
CREATININE: 0.74 mg/dL (ref 0.44–1.00)
Calcium: 9.4 mg/dL (ref 8.9–10.3)
GFR calc Af Amer: >60 mL/min (ref >60)
GFR calc non Af Amer: >60 mL/min (ref >60)
Glucose, Bld: 112 mg/dL — ABNORMAL HIGH (ref 65–99)
POTASSIUM: 4 mmol/L (ref 3.5–5.1)
Sodium: 141 mmol/L (ref 135–145)

## 2014-12-09 LAB — URINE MICROSCOPIC-ADD ON

## 2014-12-09 LAB — CBC
HCT: 40 % (ref 36.0–46.0)
HEMOGLOBIN: 13.3 g/dL (ref 12.0–15.0)
MCH: 30.1 pg (ref 26.0–34.0)
MCHC: 33.3 g/dL (ref 30.0–36.0)
MCV: 90.5 fL (ref 78.0–100.0)
PLATELETS: 208 10*3/uL (ref 150–400)
RBC: 4.42 MIL/uL (ref 3.87–5.11)
RDW: 14 % (ref 11.5–15.5)
WBC: 6.5 10*3/uL (ref 4.0–10.5)

## 2014-12-09 LAB — I-STAT CHEM 8, ED
BUN: 12 mg/dL (ref 6–20)
CALCIUM ION: 1.22 mmol/L (ref 1.13–1.30)
CREATININE: 0.9 mg/dL (ref 0.44–1.00)
Chloride: 104 mmol/L (ref 101–111)
Glucose, Bld: 108 mg/dL — ABNORMAL HIGH (ref 65–99)
HCT: 40 % (ref 36.0–46.0)
Hemoglobin: 13.6 g/dL (ref 12.0–15.0)
Potassium: 4 mmol/L (ref 3.5–5.1)
Sodium: 142 mmol/L (ref 135–145)
TCO2: 26 mmol/L (ref 0–100)

## 2014-12-09 LAB — HEPATIC FUNCTION PANEL
ALK PHOS: 91 U/L (ref 38–126)
ALT: 16 U/L (ref 14–54)
AST: 18 U/L (ref 15–41)
Albumin: 3.8 g/dL (ref 3.5–5.0)
BILIRUBIN DIRECT: 0.1 mg/dL (ref 0.1–0.5)
BILIRUBIN TOTAL: 0.4 mg/dL (ref 0.3–1.2)
Indirect Bilirubin: 0.3 mg/dL (ref 0.3–0.9)
Total Protein: 6.9 g/dL (ref 6.5–8.1)

## 2014-12-09 LAB — URINALYSIS, ROUTINE W REFLEX MICROSCOPIC
BILIRUBIN URINE: NEGATIVE
Glucose, UA: NEGATIVE mg/dL
KETONES UR: NEGATIVE mg/dL
Leukocytes, UA: NEGATIVE
Nitrite: NEGATIVE
Protein, ur: NEGATIVE mg/dL
Specific Gravity, Urine: 1.005 (ref 1.005–1.030)
Urobilinogen, UA: 0.2 mg/dL (ref 0.0–1.0)
pH: 7 (ref 5.0–8.0)

## 2014-12-09 LAB — I-STAT TROPONIN, ED: Troponin i, poc: 0.01 ng/mL (ref 0.00–0.08)

## 2014-12-09 MED ORDER — SODIUM CHLORIDE 0.9 % IV SOLN: INTRAVENOUS | Status: DC

## 2014-12-09 MED ORDER — ONDANSETRON HCL 4 MG/2ML IJ SOLN
4.0000 mg | Freq: Once | INTRAMUSCULAR | Status: AC
  Administered 2014-12-09: 4 mg via INTRAVENOUS
  Filled 2014-12-09: qty 2

## 2014-12-09 MED ORDER — ONDANSETRON 4 MG PO TBDP: 4.0000 mg | ORAL_TABLET | Freq: Three times a day (TID) | ORAL | Status: DC | PRN

## 2014-12-09 MED ORDER — SODIUM CHLORIDE 0.9 % IV BOLUS (SEPSIS)
250.0000 mL | Freq: Once | INTRAVENOUS | Status: AC
  Administered 2014-12-09: 250 mL via INTRAVENOUS

## 2014-12-09 NOTE — ED Provider Notes (Signed)
CSN: [id number]     Arrival date & time 12/09/14  1741 History   First MD Initiated Contact with Patient 12/09/14 1757     Chief Complaint  Patient presents with  . Dizziness     (Consider location/radiation/quality/duration/timing/severity/associated sxs/prior Treatment) Patient is a 81 y.o. female presenting with dizziness. The history is provided by the patient and a relative.  Dizziness Associated symptoms: hearing loss and nausea   Associated symptoms: no chest pain, no headaches, no shortness of breath, no vomiting and no weakness    patient with acute onset this morning at about 6 in the morning of what she describes as dizziness no true vertigo. States that things just didn't seem to be quite in the right position. Associated with some nausea. Patient is essentially blind in the left eye due to macular degeneration. Patient does not seem to be changed in her right eye. Patient was started on amoxicillin recently to have some teeth pulled. Patient denies any weakness in any of the room spinning any headaches.  Past Medical History  Diagnosis Date  . Hypertension   . High cholesterol   . Back pain   . Macular degeneration   . GERD (gastroesophageal reflux disease)   . OA (osteoarthritis)   . Osteoporosis   . DJD (degenerative joint disease) of cervical spine   . Moderate aortic stenosis     a. 07/2013 Echo: EF 60-65%, Gr 1 DD, mild AI, mod AS, tirv MR, mildly dil LA.  . Falls     a. uses walker, PT 3x/wk. Last fall 03/2014.   Past Surgical History  Procedure Laterality Date  . Cholecystectomy    . Back surgery    . Breast surgery     Family History  Problem Relation Age of Onset  . Heart attack Mother   . Heart attack Father    History  Substance Use Topics  . Smoking status: Never Smoker   . Smokeless tobacco: Not on file  . Alcohol Use: No   OB History    No data available     Review of Systems  Constitutional: Negative for fever.  HENT: Positive for  hearing loss. Negative for congestion.   Eyes: Positive for visual disturbance. Negative for pain.  Respiratory: Negative for shortness of breath.   Cardiovascular: Negative for chest pain.  Gastrointestinal: Positive for nausea. Negative for vomiting and abdominal pain.  Genitourinary: Negative for dysuria.  Musculoskeletal: Negative for back pain.  Skin: Negative for rash.  Neurological: Positive for dizziness. Negative for seizures, syncope, speech difficulty, weakness, light-headedness, numbness and headaches.  Hematological: Does not bruise/bleed easily.  Psychiatric/Behavioral: Negative for confusion.      Allergies  Codeine  Home Medications   Prior to Admission medications   Medication Sig Start Date End Date Taking? Authorizing Provider  acetaminophen (TYLENOL) 650 MG CR tablet Take 650 mg by mouth every 6 (six) hours as needed for pain.   Yes Historical Provider, MD  alendronate (FOSAMAX) 70 MG tablet Take 70 mg by mouth every 7 (seven) days. Take with a full glass of water on an empty stomach. Take on Saturdays   Yes Historical Provider, MD  amitriptyline (ELAVIL) 50 MG tablet Take 50 mg by mouth at bedtime.   Yes Historical Provider, MD  amLODipine (NORVASC) 5 MG tablet Take 5 mg by mouth every morning.    Yes Historical Provider, MD  amoxicillin (AMOXIL) 500 MG capsule Take 500 mg by mouth every 8 (eight) hours. 12/07/14  Yes  Historical Provider, MD  aspirin EC 81 MG EC tablet Take 1 tablet (81 mg total) by mouth daily. 04/16/14  Yes Dwana Melena, PA-C  atorvastatin (LIPITOR) 40 MG tablet Take 40 mg by mouth at bedtime.    Yes Historical Provider, MD  carvedilol (COREG) 3.125 MG tablet Take 1 tablet (3.125 mg total) by mouth 2 (two) times daily with a meal. 10/28/14  Yes Mihai Croitoru, MD  Cholecalciferol (VITAMIN D-3) 1000 UNITS CAPS Take 1 capsule by mouth daily.   Yes Historical Provider, MD  docusate sodium (COLACE) 100 MG capsule Take 300 mg by mouth at bedtime.   Yes  Historical Provider, MD  lansoprazole (PREVACID) 30 MG capsule Take 30 mg by mouth every other day.    Yes Historical Provider, MD  latanoprost (XALATAN) 0.005 % ophthalmic solution Place 1 drop into both eyes at bedtime.   Yes Historical Provider, MD  lisinopril (PRINIVIL,ZESTRIL) 40 MG tablet Take 40 mg by mouth every morning.    Yes Historical Provider, MD  tolterodine (DETROL LA) 2 MG 24 hr capsule Take 2 mg by mouth every morning.   Yes Historical Provider, MD  ondansetron (ZOFRAN ODT) 4 MG disintegrating tablet Take 1 tablet (4 mg total) by mouth every 8 (eight) hours as needed for nausea or vomiting. 12/09/14   Vanetta Mulders, MD   BP 181/65 mmHg  Pulse 84  Temp(Src) 98.3 F (36.8 C) (Oral)  Resp 18  SpO2 99% Physical Exam  Constitutional: She is oriented to person, place, and time. She appears well-developed and well-nourished. No distress.  HENT:  Head: Normocephalic and atraumatic.  Mouth/Throat: Oropharynx is clear and moist.  Eyes: Conjunctivae and EOM are normal. Pupils are equal, round, and reactive to light.  Decreased vision in the left eye due to macular degeneration.  Neck: Normal range of motion.  Cardiovascular: Normal rate, regular rhythm and normal heart sounds.   Pulmonary/Chest: Effort normal and breath sounds normal. No respiratory distress.  Abdominal: Soft. Bowel sounds are normal. There is no tenderness.  Musculoskeletal: Normal range of motion. She exhibits no edema.  Neurological: She is alert and oriented to person, place, and time. No cranial nerve deficit. She exhibits normal muscle tone. Coordination normal.  Except for the decreased vision in the left eye. Also with baseline hearing loss.  Skin: Skin is warm. No rash noted.  Nursing note and vitals reviewed.   ED Course  Procedures (including critical care time) Labs Review Labs Reviewed  BASIC METABOLIC PANEL - Abnormal; Notable for the following:    Glucose, Bld 112 (*)    All other components  within normal limits  URINALYSIS, ROUTINE W REFLEX MICROSCOPIC (NOT AT [facility]) - Abnormal; Notable for the following:    APPearance HAZY (*)    Hgb urine dipstick TRACE (*)    All other components within normal limits  I-STAT CHEM 8, ED - Abnormal; Notable for the following:    Glucose, Bld 108 (*)    All other components within normal limits  CBC  HEPATIC FUNCTION PANEL  URINE MICROSCOPIC-ADD ON  Rosezena Sensor, ED   Results for orders placed or performed during the hospital encounter of 12/09/14  CBC  Result Value Ref Range   WBC 6.5 4.0 - 10.5 K/uL   RBC 4.42 3.87 - 5.11 MIL/uL   Hemoglobin 13.3 12.0 - 15.0 g/dL   HCT 95.6 21.3 - 08.6 %   MCV 90.5 78.0 - 100.0 fL   MCH 30.1 26.0 - 34.0 pg  MCHC 33.3 30.0 - 36.0 g/dL   RDW 16.1 09.6 - 04.5 %   Platelets 208 150 - 400 K/uL  Basic metabolic panel  Result Value Ref Range   Sodium 141 135 - 145 mmol/L   Potassium 4.0 3.5 - 5.1 mmol/L   Chloride 105 101 - 111 mmol/L   CO2 28 22 - 32 mmol/L   Glucose, Bld 112 (H) 65 - 99 mg/dL   BUN 10 6 - 20 mg/dL   Creatinine, Ser 4.09 0.44 - 1.00 mg/dL   Calcium 9.4 8.9 - 81.1 mg/dL   GFR calc non Af Amer >60 >60 mL/min   GFR calc Af Amer >60 >60 mL/min   Anion gap 8 5 - 15  Urinalysis, Routine w reflex microscopic (not at [facility])  Result Value Ref Range   Color, Urine YELLOW YELLOW   APPearance HAZY (A) CLEAR   Specific Gravity, Urine 1.005 1.005 - 1.030   pH 7.0 5.0 - 8.0   Glucose, UA NEGATIVE NEGATIVE mg/dL   Hgb urine dipstick TRACE (A) NEGATIVE   Bilirubin Urine NEGATIVE NEGATIVE   Ketones, ur NEGATIVE NEGATIVE mg/dL   Protein, ur NEGATIVE NEGATIVE mg/dL   Urobilinogen, UA 0.2 0.0 - 1.0 mg/dL   Nitrite NEGATIVE NEGATIVE   Leukocytes, UA NEGATIVE NEGATIVE  Hepatic function panel  Result Value Ref Range   Total Protein 6.9 6.5 - 8.1 g/dL   Albumin 3.8 3.5 - 5.0 g/dL   AST 18 15 - 41 U/L   ALT 16 14 - 54 U/L   Alkaline Phosphatase 91 38 - 126 U/L   Total Bilirubin 0.4 0.3 -  1.2 mg/dL   Bilirubin, Direct 0.1 0.1 - 0.5 mg/dL   Indirect Bilirubin 0.3 0.3 - 0.9 mg/dL  Urine microscopic-add on  Result Value Ref Range   Squamous Epithelial / LPF RARE RARE   RBC / HPF 0-2 <3 RBC/hpf   Urine-Other AMORPHOUS URATES/PHOSPHATES   I-Stat Chem 8, ED  (not at [facility])  Result Value Ref Range   Sodium 142 135 - 145 mmol/L   Potassium 4.0 3.5 - 5.1 mmol/L   Chloride 104 101 - 111 mmol/L   BUN 12 6 - 20 mg/dL   Creatinine, Ser 9.14 0.44 - 1.00 mg/dL   Glucose, Bld 782 (H) 65 - 99 mg/dL   Calcium, Ion 9.56 2.13 - 1.30 mmol/L   TCO2 26 0 - 100 mmol/L   Hemoglobin 13.6 12.0 - 15.0 g/dL   HCT 08.6 57.8 - 46.9 %  I-Stat Troponin, ED (not at [facility])  Result Value Ref Range   Troponin i, poc 0.01 0.00 - 0.08 ng/mL   Comment 3             Imaging Review Dg Chest 2 View  12/09/2014   CLINICAL DATA:  High blood pressure, dizziness  EXAM: CHEST  2 VIEW  COMPARISON:  04/13/2014  FINDINGS: Cardiomediastinal silhouette is stable. Degenerative changes thoracic spine. Atherosclerotic calcifications of thoracic aorta. Degenerative changes bilateral shoulders left greater than right. No acute infiltrate or pleural effusion. No pulmonary edema. Stable mild elevation of the left hemidiaphragm.  IMPRESSION: No active cardiopulmonary disease.  No significant change.   Electronically Signed   By: Natasha Mead M.D.   On: 12/09/2014 19:26   Ct Head Wo Contrast  12/09/2014   CLINICAL DATA:  Dizziness  EXAM: CT HEAD WITHOUT CONTRAST  TECHNIQUE: Contiguous axial images were obtained from the base of the skull through the vertex without intravenous contrast.  COMPARISON:  May 31, 2014 and September 23, 2012  FINDINGS: Mild diffuse atrophy is stable. There is a stable 1 x 1 cm well-circumscribed increased attenuation mass in the left cerebellum, felt to represent a small meningioma. A nearby focus of calcification in the mid left cerebellum is also stable, possibly a granuloma. There is no surrounding  edema or mass effect, and this structure has remained stable for over 2 years. There is no other mass. There is no hemorrhage, extra-axial fluid collection, or midline shift. There is extensive small vessel disease throughout the centra semiovale, stable. Small vessel disease is also noted throughout both internal and external capsules as well as in the head of the caudate nucleus on the right and in the lateral fall and neck regions bilaterally, stable. There is no new gray-white compartment lesion. No acute infarct apparent. The bony calvarium appears intact. The mastoid air cells are clear.  IMPRESSION: Atrophy with extensive supratentorial small vessel disease. Small apparent meningioma in the mid left cerebellum, stable. No acute infarct. No new mass. No hemorrhage.   Electronically Signed   By: Bretta Bang III M.D.   On: 12/09/2014 19:54   Mr Brain Wo Contrast  12/09/2014   CLINICAL DATA:  Initial evaluation for acute intermittent dizziness for 1 day.  EXAM: MRI HEAD WITHOUT CONTRAST  TECHNIQUE: Multiplanar, multiecho pulse sequences of the brain and surrounding structures were obtained without intravenous contrast.  COMPARISON:  Prior CT from earlier the same day as well as earlier studies.  FINDINGS: Diffuse prominence of the CSF containing spaces is compatible with generalized age-related cerebral atrophy. Fairly prominent patchy and confluent T2/FLAIR hyperintensity within the periventricular and deep white matter both cerebral hemispheres present, most likely related to moderate chronic microvascular ischemic disease.  Several small remote lacunar infarcts present within the pons. There are associated chronic micro hemorrhages. Small remote lacunar infarcts present within the bilateral basal ganglia as well.  No abnormal foci of restricted diffusion to suggest acute intracranial infarct. Gray-white matter differentiation maintained. Normal intravascular flow voids are preserved. No acute  intracranial hemorrhage.  Small meningioma measuring 1.0 x 1.2 cm along the left tentorium noted. No associated mass effect. No other mass lesion. No midline shift. Ventricular prominence related to global parenchymal volume loss present without hydrocephalus. No extra-axial fluid collection.  Craniocervical junction within normal limits. Pituitary gland normal.  No acute abnormality about the orbits. Sequela of prior bilateral lens extraction.  Mild scattered mucosal thickening present within the ethmoidal air cells. Paranasal sinuses are otherwise clear. No mastoid effusion. Inner ear structures normal.  Bone marrow signal intensity within normal limits. Scalp soft tissues unremarkable.  IMPRESSION: 1. No acute intracranial infarct or other abnormality identified. 2. Scattered small remote lacunar infarcts involving the bilateral basal ganglia and pons. 3. Generalized age-related cerebral atrophy with moderate chronic microvascular ischemic disease. 4. 1.2 cm meningioma along left tentorium without associated mass effect.   Electronically Signed   By: Rise Mu M.D.   On: 12/09/2014 22:24     EKG Interpretation   Date/Time:  Wednesday December 09 2014 17:58:47 EDT Ventricular Rate:  82 PR Interval:  60 QRS Duration: 86 QT Interval:  370 QTC Calculation: 432 R Axis:   -54 Text Interpretation:  Sinus rhythm Short PR interval Left anterior  fascicular block Abnormal R-wave progression, early transition Minimal ST  depression, lateral leads more prominent T waves Confirmed by Berdine Rasmusson   MD, Ashauna Bertholf ([id number]) on 12/09/2014 6:09:07 PM      MDM  Final diagnoses:  Dizziness  Dizziness    The patient stating that does not feel like you're quite in the right place. Uses dizziness but no true vertigo to describe it. Not clear that this may not be a visual problem. Patient has significant left-sided macular degeneration and has very little vision on the on the left side right vision seems to be  about baseline. Patient had extensive workup without significant findings. MRI brain without any acute findings. Labs without significant abnormality. Patient has developed some nausea and that was improved with Zofran. We'll continue her on the Zofran. Electrolytes without any significant abnormality no significant anemia. And urinalysis is negative for urinary tract infection. Patient to be discharged home and follow-up with her primary care doctor.    Vanetta Mulders, MD 12/09/14 [phone]

## 2014-12-09 NOTE — ED Notes (Signed)
Pt. From [facility]. Pt. Presents with complaint of intermittent dizziness today. Denies CP/SOB. Hx of HTN and degenerative eye dx.

## 2014-12-09 NOTE — ED Notes (Signed)
Patient off the unit for scan 

## 2014-12-09 NOTE — Discharge Instructions (Signed)
Follow-up with your doctor. Return for any new or worse symptoms. Workup here to include MRI of the brain without any significant findings. Not clear whether it's true dizziness does not seem to be vertigo. Keep in mind it could possibly be related to her vision and perhaps a follow-up with her IV physician if this does not resolve would be appropriate. Take the Zofran as needed for nausea and vomiting.

## 2015-05-03 ENCOUNTER — Telehealth: Payer: Self-pay | Admitting: Cardiovascular Disease

## 2015-05-03 NOTE — Telephone Encounter (Signed)
Received records from [facility] for appointment on 05/06/15 with Dr Royann Shiversroitoru.  [facility] for Dr Croitoru's schedule on 05/06/15. lp

## 2015-05-06 ENCOUNTER — Encounter: Payer: Self-pay | Admitting: Cardiovascular Disease

## 2015-05-06 ENCOUNTER — Ambulatory Visit (INDEPENDENT_AMBULATORY_CARE_PROVIDER_SITE_OTHER): Payer: Medicare Other | Admitting: Cardiovascular Disease

## 2015-05-06 VITALS — BP 166/68 | HR 84 | Ht 61.0 in | Wt 115.9 lb

## 2015-05-06 DIAGNOSIS — I350 Nonrheumatic aortic (valve) stenosis: Secondary | ICD-10-CM

## 2015-05-06 NOTE — Patient Instructions (Signed)
Your physician has requested that you have an echocardiogram. Echocardiography is a painless test that uses sound waves to create images of your heart. It provides your doctor with information about the size and shape of your heart and how well your heart's chambers and valves are working. This procedure takes approximately one hour. There are no restrictions for this procedure.  Please keep your previously scheduled appointment in January with Dr Royann Shiversroitoru.

## 2015-05-06 NOTE — Progress Notes (Signed)
Patient ID: Alyssa Cummings, female   DOB: [birth year], 81 y.o.   MRN: [id number]     Cardiology Office Note   Date:  05/06/2015   ID:  Alyssa FoersterMarian Cummings, DOB [birth year], MRN [id number]  PCP:  Neldon LabellaMILLER,LISA LYNN, MD  Cardiologist:   Thurmon FairROITORU,Kelechi Astarita, MD   Chief Complaint  Patient presents with  . Shortness of Breath    x3.5 weeks, primarily with exertion, otherwise, no complaints      History of Present Illness: Alyssa Cummings is a 81 y.o. female who presents for  Follow-up for aortic stenosis. Since her last appointment there has been a slow but noticeable worsening of her exercise tolerance. She has especially noticed shortness of breath over the last 3 weeks. She has seen her primary care physician. Her chest x-ray has been normal. Borderline elevation in neutrophils to the suggestion of respiratory infection. Her labs were otherwise okay. She does not have anemia. She is wearing an orthopedic boot on her right foot because of some overlapping toes that have led to calluses. She continues to live at [facility] in the independent living section and walks on a daily basis. She is a vibrant lady with constant participation in social activities.   Past Medical History  Diagnosis Date  . Hypertension   . High cholesterol   . Back pain   . Macular degeneration   . GERD (gastroesophageal reflux disease)   . OA (osteoarthritis)   . Osteoporosis   . DJD (degenerative joint disease) of cervical spine   . Moderate aortic stenosis     a. 07/2013 Echo: EF 60-65%, Gr 1 DD, mild AI, mod AS, tirv MR, mildly dil LA.  . Falls     a. uses walker, PT 3x/wk. Last fall 03/2014.    Past Surgical History  Procedure Laterality Date  . Cholecystectomy    . Back surgery    . Breast surgery       Current Outpatient Prescriptions  Medication Sig Dispense Refill  . acetaminophen (TYLENOL) 650 MG CR tablet Take 650 mg by mouth every 6 (six) hours as needed for pain.    Marland Kitchen. alendronate (FOSAMAX) 70 MG tablet  Take 70 mg by mouth every 7 (seven) days. Take with a full glass of water on an empty stomach. Take on Saturdays    . amitriptyline (ELAVIL) 50 MG tablet Take 50 mg by mouth at bedtime.    Marland Kitchen. amLODipine (NORVASC) 5 MG tablet Take 5 mg by mouth every morning.     Marland Kitchen. aspirin EC 81 MG EC tablet Take 1 tablet (81 mg total) by mouth daily.    Marland Kitchen. atorvastatin (LIPITOR) 40 MG tablet Take 40 mg by mouth at bedtime.     . carvedilol (COREG) 3.125 MG tablet Take 1 tablet (3.125 mg total) by mouth 2 (two) times daily with a meal. 180 tablet 2  . Cholecalciferol (VITAMIN D-3) 1000 UNITS CAPS Take 1 capsule by mouth daily.    Marland Kitchen. docusate sodium (COLACE) 100 MG capsule Take 300 mg by mouth at bedtime.    . lansoprazole (PREVACID) 30 MG capsule Take 30 mg by mouth every other day.     . latanoprost (XALATAN) 0.005 % ophthalmic solution Place 1 drop into both eyes at bedtime.    Marland Kitchen. lisinopril (PRINIVIL,ZESTRIL) 40 MG tablet Take 40 mg by mouth every morning.     . tolterodine (DETROL LA) 2 MG 24 hr capsule Take 2 mg by mouth every morning.     No current facility-administered medications  for this visit.    Allergies:   Codeine    Social History:  The patient  reports that she has never smoked. She does not have any smokeless tobacco history on file. She reports that she does not drink alcohol or use illicit drugs.   Family History:  The patient's family history includes Heart attack in her father and mother.    ROS:  Please see the history of present illness.    Otherwise, review of systems positive for  Heart of hearing. Denies orthopnea, PND, lower extremity edema, syncope, chest pressure at rest or with exertion , focal neurological complaints..   All other systems are reviewed and negative.    PHYSICAL EXAM: VS:  BP 166/68 mmHg  Pulse 84  Ht  (1.549 m)  Wt 115 lb 14.4 oz (52.572 kg)  BMI 21.91 kg/m2 , BMI Body mass index is 21.91 kg/(m^2).  General: Alert, oriented x3, no distress Head: no  evidence of trauma, PERRL, EOMI, no exophtalmos or lid lag, no myxedema, no xanthelasma; normal ears, nose and oropharynx Neck: normal jugular venous pulsations and no hepatojugular reflux;  Low volume carotid pulses with some delay and  Week bilateral carotid bruits Chest: clear to auscultation, no signs of consolidation by percussion or palpation, normal fremitus, symmetrical and full respiratory excursions Cardiovascular: normal position and quality of the apical impulse, regular rhythm, normal first and second heart sounds,  3/6 mid peaking aortic ejection murmur.no  diastolic murmurs, rubs or gallops Abdomen: no tenderness or distention, no masses by palpation, no abnormal pulsatility or arterial bruits, normal bowel sounds, no hepatosplenomegaly Extremities: no clubbing, cyanosis or edema; 2+ radial, ulnar and brachial pulses bilaterally; 2+ right femoral, posterior tibial and dorsalis pedis pulses; 2+ left femoral, posterior tibial and dorsalis pedis pulses; no subclavian or femoral bruits Neurological: grossly nonfocal Psych: euthymic mood, full affect   EKG:  EKG is ordered today. The ekg ordered today demonstrates  Sinus rhythm with occasional PACs otherwise normal   Recent Labs: 12/09/2014: ALT 16; BUN 12; Creatinine, Ser 0.90; Hemoglobin 13.6; Platelets 208; Potassium 4.0; Sodium 142   November 2016 creatinine 0.79, hemoglobin 13.2, normal liver function tests  Lipid Panel    Component Value Date/Time   CHOL 190 04/14/2014 0237   TRIG 71 04/14/2014 0237   HDL 91 04/14/2014 0237   CHOLHDL 2.1 04/14/2014 0237   VLDL 14 04/14/2014 0237   LDLCALC 85 04/14/2014 0237      Wt Readings from Last 3 Encounters:  05/06/15 115 lb 14.4 oz (52.572 kg)  08/13/14 119 lb 4.8 oz (54.114 kg)  05/31/14 120 lb (54.432 kg)    ASSESSMENT AND PLAN:  1.  Aortic stenosis, probably severe. She now has symptoms of exertional dyspnea. Physical exam suggest progression of aortic stenosis 2 severe  degree. Will repeat echocardiogram.    we had a detailed discussion with the patient and her daughter about options for treatment as well as the natural history of aortic stenosis if left untreated. I would suspect that she will develop severe symptoms of heart failure within the next 12 months , unless mechanical intervention is performed. She is very elderly and obviously not taking good candidate for conventional surgical valve replacement. At her age, it is unlikely that transarterial valve replacement we'll offer any survival benefit, but it may offer palliation of symptoms. We discussed the procedure in some detail, possible comorbidities that might exclude this option, possible complications.  I think she should spend some time discussing  the options with her family over the next several days and weeks. We reviewed the fact that the workup pre-TAVR is quite extensive and that her age is a major disincentive for any invasive procedure. Will review options after her echocardiogram is performed and she has had some time to think about these options.  We also reviewed the fact that palliative care is a reasonable alternative.  2.  Hypertension with blood pressure today that is higher than her usual. No changes are made her medications. Wait for echo results.  Current medicines are reviewed at length with the patient today.  The patient does not have concerns regarding medicines.  The following changes have been made:  no change  Labs/ tests ordered today include:  Orders Placed This Encounter  Procedures  . ECHOCARDIOGRAM COMPLETE   Patient Instructions  Your physician has requested that you have an echocardiogram. Echocardiography is a painless test that uses sound waves to create images of your heart. It provides your doctor with information about the size and shape of your heart and how well your heart's chambers and valves are working. This procedure takes approximately one hour. There are  no restrictions for this procedure.  Please keep your previously scheduled appointment in January with Dr Royann Shivers.     Joie Bimler, MD  05/06/2015 8:58 PM    Thurmon Fair, MD, [facility] [phone] office [phone] pager

## 2015-05-10 ENCOUNTER — Encounter: Payer: Self-pay | Admitting: Cardiovascular Disease

## 2015-05-19 ENCOUNTER — Ambulatory Visit (HOSPITAL_COMMUNITY): Payer: Medicare Other | Attending: Cardiovascular Disease

## 2015-05-19 ENCOUNTER — Other Ambulatory Visit: Payer: Self-pay

## 2015-05-19 DIAGNOSIS — I352 Nonrheumatic aortic (valve) stenosis with insufficiency: Secondary | ICD-10-CM | POA: Diagnosis not present

## 2015-05-19 DIAGNOSIS — I517 Cardiomegaly: Secondary | ICD-10-CM | POA: Diagnosis not present

## 2015-05-19 DIAGNOSIS — I350 Nonrheumatic aortic (valve) stenosis: Secondary | ICD-10-CM

## 2015-05-19 DIAGNOSIS — I340 Nonrheumatic mitral (valve) insufficiency: Secondary | ICD-10-CM | POA: Insufficient documentation

## 2015-05-19 DIAGNOSIS — E7800 Pure hypercholesterolemia, unspecified: Secondary | ICD-10-CM | POA: Diagnosis not present

## 2015-05-19 DIAGNOSIS — I10 Essential (primary) hypertension: Secondary | ICD-10-CM | POA: Insufficient documentation

## 2015-05-19 DIAGNOSIS — I253 Aneurysm of heart: Secondary | ICD-10-CM | POA: Insufficient documentation

## 2015-05-24 ENCOUNTER — Telehealth: Payer: Self-pay | Admitting: Cardiovascular Disease

## 2015-05-24 NOTE — Telephone Encounter (Signed)
Pt had episode of confusion/forgetfulness the other night. Daughter reports this happens from time to time. She states this was not noticed Saturday, Sunday, or today.  Pt still having some episodes of SOB which were noted at last OV. Daughter feels she is going "downhill". Reports patient PO intake down, drinking inadequate amts of water, food intake poor.  Advised to encourage water, flavored drinks or sports drinks in limited quantities, boost or ensure if inadequate food intake. Regarding other problems, advised urgent care if acute confusion returns as is possibly indication of UTI esp considering diminished PO intake.  Advised if SOB worse, ER. Caller voiced understanding.  Route to Dr. Royann Shiversroitoru for further recommendations.

## 2015-05-24 NOTE — Telephone Encounter (Signed)
Please check urinalysis, BMET, Mg, BNP

## 2015-05-24 NOTE — Telephone Encounter (Signed)
Phone goes straight to VM. Left message requesting call back.

## 2015-05-24 NOTE — Telephone Encounter (Signed)
Communicated this to daughter - she is taking pt to PCP appt tomorrow, will request tests.  I asked that these results be faxed to us and provided our fax number. Caller voiced understanding.

## 2015-05-24 NOTE — Telephone Encounter (Signed)
Pa states her mom is dehydrated and is experiencing some confusion.  Does not know what to do.  Please call.

## 2015-05-26 ENCOUNTER — Encounter: Payer: Self-pay | Admitting: Cardiovascular Disease

## 2015-05-26 ENCOUNTER — Telehealth: Payer: Self-pay | Admitting: Cardiovascular Disease

## 2015-05-26 ENCOUNTER — Telehealth: Payer: Self-pay | Admitting: *Deleted

## 2015-05-26 DIAGNOSIS — I350 Nonrheumatic aortic (valve) stenosis: Secondary | ICD-10-CM

## 2015-05-26 NOTE — Telephone Encounter (Signed)
Daughter Pam called and her mother is agreeable to proceed with the [facility].  Order placed and Deedie will schedule for 06/08/15.

## 2015-05-26 NOTE — Telephone Encounter (Signed)
Close encounter 

## 2015-05-31 ENCOUNTER — Emergency Department (HOSPITAL_COMMUNITY)
Admission: EM | Admit: 2015-05-31 | Discharge: 2015-06-01 | Disposition: A | Discharge status: Home / Self Care | Payer: Medicare Other | Source: Home / Self Care | Attending: Emergency Medicine | Admitting: Emergency Medicine

## 2015-05-31 ENCOUNTER — Encounter (HOSPITAL_COMMUNITY): Payer: Self-pay

## 2015-05-31 ENCOUNTER — Emergency Department (HOSPITAL_COMMUNITY): Payer: Medicare Other

## 2015-05-31 DIAGNOSIS — W19XXXA Unspecified fall, initial encounter: Secondary | ICD-10-CM

## 2015-05-31 DIAGNOSIS — I10 Essential (primary) hypertension: Secondary | ICD-10-CM | POA: Diagnosis not present

## 2015-05-31 DIAGNOSIS — S32000A Wedge compression fracture of unspecified lumbar vertebra, initial encounter for closed fracture: Secondary | ICD-10-CM | POA: Diagnosis not present

## 2015-05-31 DIAGNOSIS — K219 Gastro-esophageal reflux disease without esophagitis: Secondary | ICD-10-CM | POA: Diagnosis not present

## 2015-05-31 DIAGNOSIS — T148XXA Other injury of unspecified body region, initial encounter: Secondary | ICD-10-CM

## 2015-05-31 DIAGNOSIS — S32029A Unspecified fracture of second lumbar vertebra, initial encounter for closed fracture: Secondary | ICD-10-CM | POA: Diagnosis not present

## 2015-05-31 DIAGNOSIS — M545 Low back pain: Secondary | ICD-10-CM | POA: Diagnosis not present

## 2015-05-31 DIAGNOSIS — G9340 Encephalopathy, unspecified: Secondary | ICD-10-CM | POA: Diagnosis not present

## 2015-05-31 MED ORDER — HYDROCODONE-ACETAMINOPHEN 7.5-325 MG/15ML PO SOLN
10.0000 mL | ORAL | Status: AC
  Administered 2015-05-31: 10 mL via ORAL
  Filled 2015-05-31: qty 15

## 2015-05-31 NOTE — ED Provider Notes (Signed)
CSN:[id number]     Arrival date & time 05/31/15  2158 History   First MD Initiated Contact with Patient 05/31/15 2216     Chief Complaint  Patient presents with  . Fall     (Consider location/radiation/quality/duration/timing/severity/associated sxs/prior Treatment) HPI Comments: Patient states she fell yesterday landing on the floor she had to lay there for a while before she was able to wiggle and crawl to her bed  She was able to get out of bed this morning get dressed and preform ADL's . Now with increased pain mid back denies LOC hitting her head no neck pain no weakness of extremities   Patient is a 81 y.o. female presenting with fall. The history is provided by the patient.  Fall This is a new problem. The current episode started yesterday. The problem occurs constantly. The problem has been unchanged. Pertinent negatives include no chest pain, chills, fever, headaches, neck pain, numbness, vomiting or weakness. The symptoms are aggravated by standing. She has tried nothing for the symptoms. The treatment provided no relief.    Past Medical History  Diagnosis Date  . Hypertension   . High cholesterol   . Back pain   . Macular degeneration   . GERD (gastroesophageal reflux disease)   . OA (osteoarthritis)   . Osteoporosis   . DJD (degenerative joint disease) of cervical spine   . Moderate aortic stenosis     a. 07/2013 Echo: EF 60-65%, Gr 1 DD, mild AI, mod AS, tirv MR, mildly dil LA.  . Falls     a. uses walker, PT 3x/wk. Last fall 03/2014.   Past Surgical History  Procedure Laterality Date  . Cholecystectomy    . Back surgery    . Breast surgery     Family History  Problem Relation Age of Onset  . Heart attack Mother   . Heart attack Father    Social History  Substance Use Topics  . Smoking status: Never Smoker   . Smokeless tobacco: None  . Alcohol Use: No   OB History    No data available     Review of Systems  Constitutional: Negative for fever and  chills.  Respiratory: Negative for shortness of breath.   Cardiovascular: Negative for chest pain.  Gastrointestinal: Negative for vomiting and diarrhea.  Genitourinary: Negative for dysuria and frequency.  Musculoskeletal: Positive for back pain. Negative for neck pain.  Neurological: Negative for weakness, numbness and headaches.  All other systems reviewed and are negative.     Allergies  Codeine  Home Medications   Prior to Admission medications   Medication Sig Start Date End Date Taking? Authorizing Provider  acetaminophen (TYLENOL) 650 MG CR tablet Take 650 mg by mouth every 6 (six) hours as needed for pain.   Yes Historical Provider, MD  alendronate (FOSAMAX) 70 MG tablet Take 70 mg by mouth every 7 (seven) days. Take with a full glass of water on an empty stomach. Take on Saturdays   Yes Historical Provider, MD  amitriptyline (ELAVIL) 50 MG tablet Take 50 mg by mouth at bedtime.   Yes Historical Provider, MD  amLODipine (NORVASC) 5 MG tablet Take 5 mg by mouth every morning.    Yes Historical Provider, MD  aspirin EC 81 MG EC tablet Take 1 tablet (81 mg total) by mouth daily. 04/16/14  Yes Dwana Melena, PA-C  atorvastatin (LIPITOR) 40 MG tablet Take 40 mg by mouth at bedtime.    Yes Historical Provider, MD  carvedilol (COREG) 3.125 MG tablet Take 1 tablet (3.125 mg total) by mouth 2 (two) times daily with a meal. 10/28/14  Yes Mihai Croitoru, MD  docusate sodium (COLACE) 100 MG capsule Take 100 mg by mouth at bedtime.    Yes Historical Provider, MD  lansoprazole (PREVACID) 30 MG capsule Take 30 mg by mouth every other day.    Yes Historical Provider, MD  latanoprost (XALATAN) 0.005 % ophthalmic solution Place 1 drop into both eyes at bedtime.   Yes Historical Provider, MD  lisinopril (PRINIVIL,ZESTRIL) 40 MG tablet Take 40 mg by mouth every morning.    Yes Historical Provider, MD  tolterodine (DETROL LA) 2 MG 24 hr capsule Take 2 mg by mouth every morning.   Yes Historical  Provider, MD  HYDROcodone-acetaminophen (HYCET) 7.5-325 mg/15 ml solution Take 5 mLs by mouth every 4 (four) hours as needed for moderate pain. 06/01/15   Earley FavorGail Charita Lindenberger, NP   BP 169/91 mmHg  Pulse 94  Resp 22  SpO2 96% Physical Exam  Constitutional: She appears well-developed and well-nourished.  HENT:  Head: Normocephalic.  Right Ear: External ear normal.  Left Ear: External ear normal.  Mouth/Throat: Oropharynx is clear and moist.  Eyes: Pupils are equal, round, and reactive to light.  Neck: Normal range of motion.  Cardiovascular: Normal rate and regular rhythm.   Pulmonary/Chest: Effort normal and breath sounds normal.  Abdominal: Soft. She exhibits no distension. There is no tenderness.  Musculoskeletal: Normal range of motion. She exhibits tenderness.       Back:  Skin: Skin is warm and dry.  Psychiatric: She has a normal mood and affect.  Nursing note and vitals reviewed.   ED Course  Procedures (including critical care time) Labs Review Labs Reviewed - No data to display  Imaging Review Dg Thoracic Spine W/swimmers  05/31/2015  CLINICAL DATA:  Status post fall in living room, with upper back pain. Initial encounter. EXAM: THORACIC SPINE - 3 VIEWS COMPARISON:  Chest radiograph performed 04/21/2015 FINDINGS: There is no evidence of fracture or subluxation. Vertebral bodies demonstrate normal height and alignment. Intervertebral disc spaces are preserved. The patient is status post vertebroplasty at the lower thoracic spine. Large anterior osteophytes are noted along the thoracic spine. The visualized portions of both lungs are clear. The mediastinum is unremarkable in appearance. IMPRESSION: 1. No evidence of fracture or subluxation along the thoracic spine. 2. Mild diffuse degenerative change along the thoracic spine. Electronically Signed   By: Roanna RaiderJeffery  Chang M.D.   On: 05/31/2015 23:15   Dg Lumbar Spine Complete  05/31/2015  CLINICAL DATA:  Larey SeatFell last night in the living  room.  Lumbar pain. EXAM: LUMBAR SPINE - COMPLETE 4+ VIEW COMPARISON:  CT lumbar spine 10/17/2012. Lumbar spine radiographs 10/08/2012. FINDINGS: Diffuse degenerative change throughout the lumbar spine with narrowed lumbar interspaces and associated endplate hypertrophic changes. Multilevel degenerative disc disease. Diffuse bone demineralization likely due to osteoporosis. Anterior compression of the L1 vertebra. Interval kyphoplasty changes at this level. Ballooning of the interspace at L1-2 suggesting developing compression of the superior endplate of L2, likely osteoporotic. No acute fracture is demonstrated. Normal alignment of the lumbar spine. Visualize sacral struts appear intact. IMPRESSION: Diffuse degenerative change throughout the lumbar spine. Demineralization with vertebral compression deformities consistent with osteoporosis. Compression of superior endplate of L2 since previous study appears represent chronic osteoporotic change. No acute displaced fractures identified. Electronically Signed   By: Burman NievesWilliam  Stevens M.D.   On: 05/31/2015 23:15   I have personally  reviewed and evaluated these images and lab results as part of my medical decision-making.   EKG Interpretation None     Patient had some difficulty changing positions but once on her feet was able to ambulate  MDM   Final diagnoses:  Fall, initial encounter  Muscle contusion         Earley Favor, NP 06/01/15 0010  Gwyneth Sprout, MD 06/02/15 2158

## 2015-05-31 NOTE — ED Notes (Signed)
Gail, NP at the bedside.  

## 2015-05-31 NOTE — ED Notes (Signed)
Per [facility], pt from [facility] assisted living for fall last night in her living room. Pt visually impaired and HOH per EMS. Pt c/o lumbar tenderness when palpated. C-collar placed on pt by EMS. And given 150 mcg Fentanyl for pain. [facility]

## 2015-05-31 NOTE — ED Notes (Signed)
Patient transported to X-ray 

## 2015-05-31 NOTE — ED Notes (Signed)
Family at bedside. 

## 2015-06-01 ENCOUNTER — Encounter (HOSPITAL_COMMUNITY): Payer: Self-pay | Admitting: *Deleted

## 2015-06-01 ENCOUNTER — Emergency Department (HOSPITAL_COMMUNITY): Payer: Medicare Other

## 2015-06-01 ENCOUNTER — Inpatient Hospital Stay (HOSPITAL_COMMUNITY)
Admission: EM | Admit: 2015-06-01 | Discharge: 2015-06-03 | DRG: 551 | Disposition: A | Discharge status: Skilled Nursing Facility | Payer: Medicare Other | Attending: Internal Medicine | Admitting: Internal Medicine

## 2015-06-01 DIAGNOSIS — Z7982 Long term (current) use of aspirin: Secondary | ICD-10-CM | POA: Diagnosis not present

## 2015-06-01 DIAGNOSIS — I10 Essential (primary) hypertension: Secondary | ICD-10-CM | POA: Diagnosis present

## 2015-06-01 DIAGNOSIS — M1990 Unspecified osteoarthritis, unspecified site: Secondary | ICD-10-CM | POA: Diagnosis present

## 2015-06-01 DIAGNOSIS — H3530 Unspecified macular degeneration: Secondary | ICD-10-CM | POA: Diagnosis present

## 2015-06-01 DIAGNOSIS — M810 Age-related osteoporosis without current pathological fracture: Secondary | ICD-10-CM | POA: Diagnosis present

## 2015-06-01 DIAGNOSIS — K219 Gastro-esophageal reflux disease without esophagitis: Secondary | ICD-10-CM | POA: Diagnosis present

## 2015-06-01 DIAGNOSIS — M545 Low back pain, unspecified: Secondary | ICD-10-CM | POA: Diagnosis present

## 2015-06-01 DIAGNOSIS — Z885 Allergy status to narcotic agent status: Secondary | ICD-10-CM

## 2015-06-01 DIAGNOSIS — Z79899 Other long term (current) drug therapy: Secondary | ICD-10-CM

## 2015-06-01 DIAGNOSIS — E7800 Pure hypercholesterolemia, unspecified: Secondary | ICD-10-CM | POA: Diagnosis present

## 2015-06-01 DIAGNOSIS — E785 Hyperlipidemia, unspecified: Secondary | ICD-10-CM | POA: Diagnosis present

## 2015-06-01 DIAGNOSIS — M479 Spondylosis, unspecified: Secondary | ICD-10-CM | POA: Diagnosis present

## 2015-06-01 DIAGNOSIS — S32029A Unspecified fracture of second lumbar vertebra, initial encounter for closed fracture: Principal | ICD-10-CM | POA: Diagnosis present

## 2015-06-01 DIAGNOSIS — I350 Nonrheumatic aortic (valve) stenosis: Secondary | ICD-10-CM | POA: Diagnosis present

## 2015-06-01 DIAGNOSIS — G9340 Encephalopathy, unspecified: Secondary | ICD-10-CM | POA: Diagnosis present

## 2015-06-01 DIAGNOSIS — W19XXXA Unspecified fall, initial encounter: Secondary | ICD-10-CM

## 2015-06-01 DIAGNOSIS — S32000S Wedge compression fracture of unspecified lumbar vertebra, sequela: Secondary | ICD-10-CM

## 2015-06-01 DIAGNOSIS — S32000A Wedge compression fracture of unspecified lumbar vertebra, initial encounter for closed fracture: Secondary | ICD-10-CM

## 2015-06-01 DIAGNOSIS — W07XXXA Fall from chair, initial encounter: Secondary | ICD-10-CM | POA: Diagnosis present

## 2015-06-01 LAB — I-STAT CHEM 8, ED
BUN: 11 mg/dL (ref 6–20)
CALCIUM ION: 1.09 mmol/L — AB (ref 1.13–1.30)
CHLORIDE: 105 mmol/L (ref 101–111)
CREATININE: 0.6 mg/dL (ref 0.44–1.00)
Glucose, Bld: 144 mg/dL — ABNORMAL HIGH (ref 65–99)
HCT: 39 % (ref 36.0–46.0)
Hemoglobin: 13.3 g/dL (ref 12.0–15.0)
Potassium: 4 mmol/L (ref 3.5–5.1)
Sodium: 140 mmol/L (ref 135–145)
TCO2: 25 mmol/L (ref 0–100)

## 2015-06-01 LAB — CBC
HCT: 40 % (ref 36.0–46.0)
HEMOGLOBIN: 13.1 g/dL (ref 12.0–15.0)
MCH: 30.2 pg (ref 26.0–34.0)
MCHC: 32.8 g/dL (ref 30.0–36.0)
MCV: 92.2 fL (ref 78.0–100.0)
PLATELETS: 157 10*3/uL (ref 150–400)
RBC: 4.34 MIL/uL (ref 3.87–5.11)
RDW: 13.5 % (ref 11.5–15.5)
WBC: 10.4 10*3/uL (ref 4.0–10.5)

## 2015-06-01 MED ORDER — ACETAMINOPHEN 325 MG PO TABS
650.0000 mg | ORAL_TABLET | Freq: Four times a day (QID) | ORAL | Status: DC | PRN
  Administered 2015-06-03 (×2): 650 mg via ORAL
  Filled 2015-06-01 (×2): qty 2

## 2015-06-01 MED ORDER — ONDANSETRON HCL 4 MG/2ML IJ SOLN: 4.0000 mg | Freq: Four times a day (QID) | INTRAMUSCULAR | Status: DC | PRN

## 2015-06-01 MED ORDER — FOLIC ACID 1 MG PO TABS
1.0000 mg | ORAL_TABLET | Freq: Every day | ORAL | Status: DC
  Administered 2015-06-03: 1 mg via ORAL
  Filled 2015-06-01 (×2): qty 1

## 2015-06-01 MED ORDER — CARVEDILOL 3.125 MG PO TABS
3.1250 mg | ORAL_TABLET | Freq: Two times a day (BID) | ORAL | Status: DC
  Administered 2015-06-02 – 2015-06-03 (×3): 3.125 mg via ORAL
  Filled 2015-06-01 (×5): qty 1

## 2015-06-01 MED ORDER — FENTANYL CITRATE (PF) 100 MCG/2ML IJ SOLN
100.0000 ug | Freq: Once | INTRAMUSCULAR | Status: AC
  Administered 2015-06-01: 100 ug via INTRAVENOUS
  Filled 2015-06-01: qty 2

## 2015-06-01 MED ORDER — HYDROCODONE-ACETAMINOPHEN 5-325 MG PO TABS
1.0000 | ORAL_TABLET | ORAL | Status: DC | PRN
  Administered 2015-06-02 (×2): 1 via ORAL
  Filled 2015-06-01 (×3): qty 1

## 2015-06-01 MED ORDER — ATORVASTATIN CALCIUM 40 MG PO TABS
40.0000 mg | ORAL_TABLET | Freq: Every day | ORAL | Status: DC
  Administered 2015-06-02: 40 mg via ORAL
  Filled 2015-06-01 (×3): qty 1

## 2015-06-01 MED ORDER — POLYETHYLENE GLYCOL 3350 17 G PO PACK: 17.0000 g | PACK | Freq: Every day | ORAL | Status: DC | PRN

## 2015-06-01 MED ORDER — HYDROCODONE-ACETAMINOPHEN 7.5-325 MG/15ML PO SOLN: 5.0000 mL | ORAL | Status: DC | PRN

## 2015-06-01 MED ORDER — VITAMIN B-1 100 MG PO TABS
100.0000 mg | ORAL_TABLET | Freq: Every day | ORAL | Status: DC
Start: 2015-06-02 — End: 2015-06-03
  Administered 2015-06-03: 100 mg via ORAL
  Filled 2015-06-01 (×2): qty 1

## 2015-06-01 MED ORDER — DOCUSATE SODIUM 100 MG PO CAPS: 100.0000 mg | ORAL_CAPSULE | Freq: Every day | ORAL | Status: DC

## 2015-06-01 MED ORDER — SODIUM CHLORIDE 0.9 % IV SOLN
INTRAVENOUS | Status: DC
  Administered 2015-06-01: via INTRAVENOUS

## 2015-06-01 MED ORDER — PANTOPRAZOLE SODIUM 40 MG PO TBEC
40.0000 mg | DELAYED_RELEASE_TABLET | Freq: Every day | ORAL | Status: DC
  Administered 2015-06-03: 40 mg via ORAL
  Filled 2015-06-01 (×2): qty 1

## 2015-06-01 MED ORDER — ALENDRONATE SODIUM 70 MG PO TABS: 70.0000 mg | ORAL_TABLET | ORAL | Status: DC

## 2015-06-01 MED ORDER — AMITRIPTYLINE HCL 50 MG PO TABS
50.0000 mg | ORAL_TABLET | Freq: Every day | ORAL | Status: DC
  Administered 2015-06-01 – 2015-06-02 (×2): 50 mg via ORAL
  Filled 2015-06-01 (×3): qty 1

## 2015-06-01 MED ORDER — LATANOPROST 0.005 % OP SOLN
1.0000 [drp] | Freq: Every day | OPHTHALMIC | Status: DC
  Administered 2015-06-01 – 2015-06-02 (×2): 1 [drp] via OPHTHALMIC
  Filled 2015-06-01: qty 2.5

## 2015-06-01 MED ORDER — HYDROMORPHONE HCL 1 MG/ML IJ SOLN: 0.5000 mg | INTRAMUSCULAR | Status: DC | PRN

## 2015-06-01 MED ORDER — ACETAMINOPHEN 650 MG RE SUPP
650.0000 mg | Freq: Four times a day (QID) | RECTAL | Status: DC | PRN
Start: 2015-06-01 — End: 2015-06-03

## 2015-06-01 MED ORDER — METHOCARBAMOL 500 MG PO TABS
750.0000 mg | ORAL_TABLET | Freq: Four times a day (QID) | ORAL | Status: DC | PRN
  Administered 2015-06-02 (×2): 750 mg via ORAL
  Filled 2015-06-01 (×2): qty 2

## 2015-06-01 MED ORDER — AMLODIPINE BESYLATE 5 MG PO TABS
5.0000 mg | ORAL_TABLET | Freq: Every morning | ORAL | Status: DC
  Filled 2015-06-01: qty 1

## 2015-06-01 MED ORDER — BISACODYL 10 MG RE SUPP: 10.0000 mg | Freq: Every day | RECTAL | Status: DC | PRN

## 2015-06-01 MED ORDER — ADULT MULTIVITAMIN W/MINERALS CH
1.0000 | ORAL_TABLET | Freq: Every day | ORAL | Status: DC
Start: 2015-06-02 — End: 2015-06-03
  Administered 2015-06-03: 1 via ORAL
  Filled 2015-06-01 (×2): qty 1

## 2015-06-01 MED ORDER — ONDANSETRON HCL 4 MG PO TABS: 4.0000 mg | ORAL_TABLET | Freq: Four times a day (QID) | ORAL | Status: DC | PRN

## 2015-06-01 MED ORDER — FESOTERODINE FUMARATE ER 4 MG PO TB24
4.0000 mg | ORAL_TABLET | Freq: Every day | ORAL | Status: DC
  Administered 2015-06-03: 4 mg via ORAL
  Filled 2015-06-01 (×2): qty 1

## 2015-06-01 MED ORDER — LISINOPRIL 40 MG PO TABS
40.0000 mg | ORAL_TABLET | Freq: Every morning | ORAL | Status: DC
  Filled 2015-06-01: qty 1

## 2015-06-01 NOTE — H&P (Signed)
[facility] History and Physical  Alyssa FoersterMarian Cummings ZOX:[id number] DOB: 07/26/1917 DOA: 06/01/2015  Referring physician: Doug SouSam Jacubowitz, MD PCP: Neldon LabellaMILLER,LISA LYNN, MD   Chief Complaint: Lumbar Pain  HPI: Alyssa FoersterMarian Cummings is a 81 y.o. female with history of HTN HLD GERD DJD presents with pain in lumbar area. [facility]. Patient apparently took a fall on Sunday at her nursing facility. Patients pain had gotten worse and was evaluated. Patient was treated and discharged. Her daughter spoke with her PCP Dr Hyacinth MeekerMiller and was advised to come back into the ED for assessment. Patient had no LOC on the original fall. She apparently lndded on her buttocks. She has no weakness noted. She has sever pain however and so needs pain control. On her Xray there was presence of L2 compression fracture. She has had a prior compression fracture and at that time had a kyphoplasty.   Review of Systems:  12 point ROS performed and is unremarkable other than HPI.   Past Medical History  Diagnosis Date  . Hypertension   . High cholesterol   . Back pain   . Macular degeneration   . GERD (gastroesophageal reflux disease)   . OA (osteoarthritis)   . Osteoporosis   . DJD (degenerative joint disease) of cervical spine   . Moderate aortic stenosis     a. 07/2013 Echo: EF 60-65%, Gr 1 DD, mild AI, mod AS, tirv MR, mildly dil LA.  . Falls     a. uses walker, PT 3x/wk. Last fall 03/2014.   Past Surgical History  Procedure Laterality Date  . Cholecystectomy    . Back surgery    . Breast surgery     Social History:  reports that she has never smoked. She does not have any smokeless tobacco history on file. She reports that she does not drink alcohol or use illicit drugs.  Allergies  Allergen Reactions  . Codeine Nausea And Vomiting    Family History  Problem Relation Age of Onset  . Heart attack Mother   . Heart attack Father      Prior to Admission medications    Medication Sig Start Date End Date Taking? Authorizing Provider  acetaminophen (TYLENOL) 650 MG CR tablet Take 650 mg by mouth every 6 (six) hours as needed for pain.   Yes Historical Provider, MD  alendronate (FOSAMAX) 70 MG tablet Take 70 mg by mouth every 7 (seven) days. Take with a full glass of water on an empty stomach. Take on Saturdays   Yes Historical Provider, MD  amitriptyline (ELAVIL) 50 MG tablet Take 50 mg by mouth at bedtime.   Yes Historical Provider, MD  amLODipine (NORVASC) 5 MG tablet Take 5 mg by mouth every morning.    Yes Historical Provider, MD  aspirin EC 81 MG EC tablet Take 1 tablet (81 mg total) by mouth daily. 04/16/14  Yes Dwana MelenaBryan W Hager, PA-C  atorvastatin (LIPITOR) 40 MG tablet Take 40 mg by mouth at bedtime.    Yes Historical Provider, MD  carvedilol (COREG) 3.125 MG tablet Take 1 tablet (3.125 mg total) by mouth 2 (two) times daily with a meal. 10/28/14  Yes Mihai Croitoru, MD  docusate sodium (COLACE) 100 MG capsule Take 100 mg by mouth at bedtime.    Yes Historical Provider, MD  HYDROcodone-acetaminophen (HYCET) 7.5-325 mg/15 ml solution Take 5 mLs by mouth every 4 (four) hours as needed for moderate pain. 06/01/15  Yes Earley FavorGail Schulz, NP  lansoprazole (PREVACID) 30 MG capsule Take 30 mg by mouth every other day.    Yes Historical Provider, MD  latanoprost (XALATAN) 0.005 % ophthalmic solution Place 1 drop into both eyes at bedtime.   Yes Historical Provider, MD  lisinopril (PRINIVIL,ZESTRIL) 40 MG tablet Take 40 mg by mouth every morning.    Yes Historical Provider, MD  tolterodine (DETROL LA) 2 MG 24 hr capsule Take 2 mg by mouth every morning.   Yes Historical Provider, MD   Physical Exam: Filed Vitals:   06/01/15 1936 06/01/15 1949  BP:  183/79  Pulse:  88  Temp:  99.4 F (37.4 C)  TempSrc:  Rectal  Resp:  21  SpO2: 98% 94%    Wt Readings from Last 3 Encounters:  05/06/15 52.572 kg (115 lb 14.4 oz)  08/13/14 54.114 kg (119 lb 4.8 oz)  05/31/14 54.432  kg (120 lb)    General:  Appears calm and comfortable Eyes: PERRL, normal lids, irises & conjunctiva ENT: grossly normal hearing, lips & tongue Neck: no LAD, masses or thyromegaly Cardiovascular: RRR, +murmur. No LE edema Respiratory: CTA bilaterally, no w/r/r. Normal respiratory effort. Abdomen: soft, ntnd Skin: no rash or induration seen on limited exam Musculoskeletal: c/o severe back pain Psychiatric: speech fluent and appropriate Neurologic: unable to assess due to pain.          Labs on Admission:  Basic Metabolic Panel:  Recent Labs Lab 06/01/15 2035  NA 140  K 4.0  CL 105  GLUCOSE 144*  BUN 11  CREATININE 0.60   Liver Function Tests: No results for input(s): AST, ALT, ALKPHOS, BILITOT, PROT, ALBUMIN in the last 168 hours. No results for input(s): LIPASE, AMYLASE in the last 168 hours. No results for input(s): AMMONIA in the last 168 hours. CBC:  Recent Labs Lab 06/01/15 2035 06/01/15 2056  WBC  --  10.4  HGB 13.3 13.1  HCT 39.0 40.0  MCV  --  92.2  PLT  --  157   Cardiac Enzymes: No results for input(s): CKTOTAL, CKMB, CKMBINDEX, TROPONINI in the last 168 hours.  BNP (last 3 results) No results for input(s): BNP in the last 8760 hours.  ProBNP (last 3 results) No results for input(s): PROBNP in the last 8760 hours.  CBG: No results for input(s): GLUCAP in the last 168 hours.  Radiological Exams on Admission: Dg Thoracic Spine W/swimmers  05/31/2015  CLINICAL DATA:  Status post fall in living room, with upper back pain. Initial encounter. EXAM: THORACIC SPINE - 3 VIEWS COMPARISON:  Chest radiograph performed 04/21/2015 FINDINGS: There is no evidence of fracture or subluxation. Vertebral bodies demonstrate normal height and alignment. Intervertebral disc spaces are preserved. The patient is status post vertebroplasty at the lower thoracic spine. Large anterior osteophytes are noted along the thoracic spine. The visualized portions of both lungs are  clear. The mediastinum is unremarkable in appearance. IMPRESSION: 1. No evidence of fracture or subluxation along the thoracic spine. 2. Mild diffuse degenerative change along the thoracic spine. Electronically Signed   By: Roanna Raider M.D.   On: 05/31/2015 23:15   Dg Lumbar Spine Complete  05/31/2015  CLINICAL DATA:  Larey Seat last night in the living room.  Lumbar pain. EXAM: LUMBAR SPINE - COMPLETE 4+ VIEW COMPARISON:  CT lumbar spine 10/17/2012. Lumbar spine radiographs 10/08/2012. FINDINGS: Diffuse degenerative change throughout the lumbar spine with narrowed lumbar interspaces and associated endplate hypertrophic changes. Multilevel degenerative disc disease. Diffuse bone demineralization likely due to osteoporosis. Anterior compression  of the L1 vertebra. Interval kyphoplasty changes at this level. Ballooning of the interspace at L1-2 suggesting developing compression of the superior endplate of L2, likely osteoporotic. No acute fracture is demonstrated. Normal alignment of the lumbar spine. Visualize sacral struts appear intact. IMPRESSION: Diffuse degenerative change throughout the lumbar spine. Demineralization with vertebral compression deformities consistent with osteoporosis. Compression of superior endplate of L2 since previous study appears represent chronic osteoporotic change. No acute displaced fractures identified. Electronically Signed   By: Burman Nieves M.D.   On: 05/31/2015 23:15   Ct Lumbar Spine Wo Contrast  06/01/2015  CLINICAL DATA:  Fall on Sunday. Worsening lower back pain. Prior vertebroplasty. EXAM: CT LUMBAR SPINE WITHOUT CONTRAST TECHNIQUE: Multidetector CT imaging of the lumbar spine was performed without intravenous contrast administration. Multiplanar CT image reconstructions were also generated. COMPARISON:  Plain films of 1 day prior and CT of 10/17/2012 FINDINGS: Soft tissues: Abdominal aortic atherosclerosis. Extensive colonic diverticulosis. No paravertebral  hematoma. Bones: Osteopenia. Bone island in the left iliac. Multilevel lumbar facet arthropathy. Prior vertebral augmentation at the L1 vertebral body. An underlying moderate compression deformity with ventral canal encroachment is grossly similar to 10/17/2012 otherwise. L2 superior endplate compression deformity is subacute. Linear fracture lines identified including on image 31/ series 8. Mild (20%) vertebral body height loss with sclerosis of the superior aspect of the vertebral body. Minimal ventral canal encroachment including on image 29/series 8. No extension into the posterior elements Remainder vertebral body height is maintained. Grade 1 L3-4 anterolisthesis is unchanged. mild convex left lumbar spine curvature. Multifactorial central canal stenosis at L3-4 and L4-5. Prior laminectomies at L3-4. IMPRESSION: 1. Subacute superior endplate compression deformity at L2. Minimal canal encroachment with approximately 20% vertebral body height loss. 2. Chronic L1 compression deformity, status post vertebral augmentation. 3. Spondylosis with chronic L3-4 grade 1 anterolisthesis. Electronically Signed   By: Jeronimo Greaves M.D.   On: 06/01/2015 20:58      Assessment/Plan Principal Problem:   Severe lumbar pain Active Problems:   Essential hypertension   Hyperlipidemia   GERD (gastroesophageal reflux disease)   L2 vertebral fracture (HCC)   1. Severe Lumbar pain with L2 compression fracture -she is being admitted for pain control -will need orthopedics consult in am to be called -may need MRI depending on ortho evaluation  2. Hypertension -continue with coreg and norvasc lisinopril -monitor pressures  3. HLD -will continue with lipitor -check lipid panel  4. GERD -continue PPI  5. Aortic Stenosis -sever by history     Code Status: full code DVT Prophylaxis:SCD Family Communication: none Disposition Plan: SNF    [facility] A [facility] Pager [phone]

## 2015-06-01 NOTE — ED Notes (Signed)
Pt departed in NAD.  

## 2015-06-01 NOTE — ED Provider Notes (Signed)
CSN: [id number]     Arrival date & time 06/01/15  1921 History   First MD Initiated Contact with Patient 06/01/15 1939     Chief Complaint  Patient presents with  . fall x2 days ago      (Consider location/radiation/quality/duration/timing/severity/associated sxs/prior Treatment) HPI Patient fell 2 days ago when she missed a chair injuring her back. She was seen at [facility] emergency apartment yesterday. Determined to have L2 compression superior endplate fracture new since 2014. She was treated with hydrocodone, solon paz and tylenol with provides transient relief. Pain is worse with changing positions improved with lying supine. No other associated symptoms. No loss of bladder bowel control. Past Medical History  Diagnosis Date  . Hypertension   . High cholesterol   . Back pain   . Macular degeneration   . GERD (gastroesophageal reflux disease)   . OA (osteoarthritis)   . Osteoporosis   . DJD (degenerative joint disease) of cervical spine   . Moderate aortic stenosis     a. 07/2013 Echo: EF 60-65%, Gr 1 DD, mild AI, mod AS, tirv MR, mildly dil LA.  . Falls     a. uses walker, PT 3x/wk. Last fall 03/2014.   Past Surgical History  Procedure Laterality Date  . Cholecystectomy    . Back surgery    . Breast surgery     Family History  Problem Relation Age of Onset  . Heart attack Mother   . Heart attack Father    Social History  Substance Use Topics  . Smoking status: Never Smoker   . Smokeless tobacco: Not on file  . Alcohol Use: No   OB History    No data available     Review of Systems  Musculoskeletal: Positive for back pain.       Walks with walker  All other systems reviewed and are negative.     Allergies  Codeine  Home Medications   Prior to Admission medications   Medication Sig Start Date End Date Taking? Authorizing Provider  acetaminophen (TYLENOL) 650 MG CR tablet Take 650 mg by mouth every 6 (six) hours as needed for pain.    Historical  Provider, MD  alendronate (FOSAMAX) 70 MG tablet Take 70 mg by mouth every 7 (seven) days. Take with a full glass of water on an empty stomach. Take on Saturdays    Historical Provider, MD  amitriptyline (ELAVIL) 50 MG tablet Take 50 mg by mouth at bedtime.    Historical Provider, MD  amLODipine (NORVASC) 5 MG tablet Take 5 mg by mouth every morning.     Historical Provider, MD  aspirin EC 81 MG EC tablet Take 1 tablet (81 mg total) by mouth daily. 04/16/14   Dwana Melena, PA-C  atorvastatin (LIPITOR) 40 MG tablet Take 40 mg by mouth at bedtime.     Historical Provider, MD  carvedilol (COREG) 3.125 MG tablet Take 1 tablet (3.125 mg total) by mouth 2 (two) times daily with a meal. 10/28/14   Mihai Croitoru, MD  docusate sodium (COLACE) 100 MG capsule Take 100 mg by mouth at bedtime.     Historical Provider, MD  HYDROcodone-acetaminophen (HYCET) 7.5-325 mg/15 ml solution Take 5 mLs by mouth every 4 (four) hours as needed for moderate pain. 06/01/15   Earley Favor, NP  lansoprazole (PREVACID) 30 MG capsule Take 30 mg by mouth every other day.     Historical Provider, MD  latanoprost (XALATAN) 0.005 % ophthalmic solution Place 1 drop  into both eyes at bedtime.    Historical Provider, MD  lisinopril (PRINIVIL,ZESTRIL) 40 MG tablet Take 40 mg by mouth every morning.     Historical Provider, MD  tolterodine (DETROL LA) 2 MG 24 hr capsule Take 2 mg by mouth every morning.    Historical Provider, MD   SpO2 98% Physical Exam  Constitutional:  Frail, chronically ill-appearing  HENT:  Head: Normocephalic and atraumatic.  Eyes: Conjunctivae are normal. Pupils are equal, round, and reactive to light.  Neck: Neck supple. No tracheal deviation present. No thyromegaly present.  Cardiovascular: Normal rate and regular rhythm.   No murmur heard. Pulmonary/Chest: Effort normal and breath sounds normal.  Abdominal: Soft. Bowel sounds are normal. She exhibits no distension. There is no tenderness.   Musculoskeletal: Normal range of motion. She exhibits no edema or tenderness.  Entire spine nontender. She has pain at lumbar spine when she flexes at the waist. Pelvis stable and nontender. All 4 extremities without contusion abrasion or tenderness neurovascular intact  Neurological: She is alert. Coordination normal.  Skin: Skin is warm and dry. No rash noted.  Psychiatric: She has a normal mood and affect.  Nursing note and vitals reviewed.   ED Course  Procedures (including critical care time) Labs Review Labs Reviewed - No data to display  Imaging Review Dg Thoracic Spine W/swimmers  05/31/2015  CLINICAL DATA:  Status post fall in living room, with upper back pain. Initial encounter. EXAM: THORACIC SPINE - 3 VIEWS COMPARISON:  Chest radiograph performed 04/21/2015 FINDINGS: There is no evidence of fracture or subluxation. Vertebral bodies demonstrate normal height and alignment. Intervertebral disc spaces are preserved. The patient is status post vertebroplasty at the lower thoracic spine. Large anterior osteophytes are noted along the thoracic spine. The visualized portions of both lungs are clear. The mediastinum is unremarkable in appearance. IMPRESSION: 1. No evidence of fracture or subluxation along the thoracic spine. 2. Mild diffuse degenerative change along the thoracic spine. Electronically Signed   By: Roanna Raider M.D.   On: 05/31/2015 23:15   Dg Lumbar Spine Complete  05/31/2015  CLINICAL DATA:  Larey Seat last night in the living room.  Lumbar pain. EXAM: LUMBAR SPINE - COMPLETE 4+ VIEW COMPARISON:  CT lumbar spine 10/17/2012. Lumbar spine radiographs 10/08/2012. FINDINGS: Diffuse degenerative change throughout the lumbar spine with narrowed lumbar interspaces and associated endplate hypertrophic changes. Multilevel degenerative disc disease. Diffuse bone demineralization likely due to osteoporosis. Anterior compression of the L1 vertebra. Interval kyphoplasty changes at this  level. Ballooning of the interspace at L1-2 suggesting developing compression of the superior endplate of L2, likely osteoporotic. No acute fracture is demonstrated. Normal alignment of the lumbar spine. Visualize sacral struts appear intact. IMPRESSION: Diffuse degenerative change throughout the lumbar spine. Demineralization with vertebral compression deformities consistent with osteoporosis. Compression of superior endplate of L2 since previous study appears represent chronic osteoporotic change. No acute displaced fractures identified. Electronically Signed   By: Burman Nieves M.D.   On: 05/31/2015 23:15   Results for orders placed or performed during the hospital encounter of 06/01/15  CBC  Result Value Ref Range   WBC 10.4 4.0 - 10.5 K/uL   RBC 4.34 3.87 - 5.11 MIL/uL   Hemoglobin 13.1 12.0 - 15.0 g/dL   HCT 09.8 11.9 - 14.7 %   MCV 92.2 78.0 - 100.0 fL   MCH 30.2 26.0 - 34.0 pg   MCHC 32.8 30.0 - 36.0 g/dL   RDW 82.9 56.2 - 13.0 %  Platelets 157 150 - 400 K/uL  I-stat chem 8, ed  Result Value Ref Range   Sodium 140 135 - 145 mmol/L   Potassium 4.0 3.5 - 5.1 mmol/L   Chloride 105 101 - 111 mmol/L   BUN 11 6 - 20 mg/dL   Creatinine, Ser 4.090.60 0.44 - 1.00 mg/dL   Glucose, Bld 811144 (H) 65 - 99 mg/dL   Calcium, Ion 9.141.09 (L) 1.13 - 1.30 mmol/L   TCO2 25 0 - 100 mmol/L   Hemoglobin 13.3 12.0 - 15.0 g/dL   HCT 78.239.0 95.636.0 - 21.346.0 %   Dg Thoracic Spine W/swimmers  05/31/2015  CLINICAL DATA:  Status post fall in living room, with upper back pain. Initial encounter. EXAM: THORACIC SPINE - 3 VIEWS COMPARISON:  Chest radiograph performed 04/21/2015 FINDINGS: There is no evidence of fracture or subluxation. Vertebral bodies demonstrate normal height and alignment. Intervertebral disc spaces are preserved. The patient is status post vertebroplasty at the lower thoracic spine. Large anterior osteophytes are noted along the thoracic spine. The visualized portions of both lungs are clear. The  mediastinum is unremarkable in appearance. IMPRESSION: 1. No evidence of fracture or subluxation along the thoracic spine. 2. Mild diffuse degenerative change along the thoracic spine. Electronically Signed   By: Roanna RaiderJeffery  Chang M.D.   On: 05/31/2015 23:15   Dg Lumbar Spine Complete  05/31/2015  CLINICAL DATA:  Larey SeatFell last night in the living room.  Lumbar pain. EXAM: LUMBAR SPINE - COMPLETE 4+ VIEW COMPARISON:  CT lumbar spine 10/17/2012. Lumbar spine radiographs 10/08/2012. FINDINGS: Diffuse degenerative change throughout the lumbar spine with narrowed lumbar interspaces and associated endplate hypertrophic changes. Multilevel degenerative disc disease. Diffuse bone demineralization likely due to osteoporosis. Anterior compression of the L1 vertebra. Interval kyphoplasty changes at this level. Ballooning of the interspace at L1-2 suggesting developing compression of the superior endplate of L2, likely osteoporotic. No acute fracture is demonstrated. Normal alignment of the lumbar spine. Visualize sacral struts appear intact. IMPRESSION: Diffuse degenerative change throughout the lumbar spine. Demineralization with vertebral compression deformities consistent with osteoporosis. Compression of superior endplate of L2 since previous study appears represent chronic osteoporotic change. No acute displaced fractures identified. Electronically Signed   By: Burman NievesWilliam  Stevens M.D.   On: 05/31/2015 23:15   Ct Lumbar Spine Wo Contrast  06/01/2015  CLINICAL DATA:  Fall on Sunday. Worsening lower back pain. Prior vertebroplasty. EXAM: CT LUMBAR SPINE WITHOUT CONTRAST TECHNIQUE: Multidetector CT imaging of the lumbar spine was performed without intravenous contrast administration. Multiplanar CT image reconstructions were also generated. COMPARISON:  Plain films of 1 day prior and CT of 10/17/2012 FINDINGS: Soft tissues: Abdominal aortic atherosclerosis. Extensive colonic diverticulosis. No paravertebral hematoma. Bones:  Osteopenia. Bone island in the left iliac. Multilevel lumbar facet arthropathy. Prior vertebral augmentation at the L1 vertebral body. An underlying moderate compression deformity with ventral canal encroachment is grossly similar to 10/17/2012 otherwise. L2 superior endplate compression deformity is subacute. Linear fracture lines identified including on image 31/ series 8. Mild (20%) vertebral body height loss with sclerosis of the superior aspect of the vertebral body. Minimal ventral canal encroachment including on image 29/series 8. No extension into the posterior elements Remainder vertebral body height is maintained. Grade 1 L3-4 anterolisthesis is unchanged. mild convex left lumbar spine curvature. Multifactorial central canal stenosis at L3-4 and L4-5. Prior laminectomies at L3-4. IMPRESSION: 1. Subacute superior endplate compression deformity at L2. Minimal canal encroachment with approximately 20% vertebral body height loss. 2. Chronic L1 compression deformity,  status post vertebral augmentation. 3. Spondylosis with chronic L3-4 grade 1 anterolisthesis. Electronically Signed   By: Jeronimo Greaves M.D.   On: 06/01/2015 20:58    I have personally reviewed and evaluated these images and lab results as part of my medical decision-making.   EKG Interpretation None     9:35 PM patient requesting pain medicine. Intravenous Fentanyl ordered  10:40 PM pain improved after treatment with intravenous fentanyl. Patient resting comfortably. MDM  I've consulted Dr. Welton Flakes evaluated patient in the emergency department for inpatient stay or pain control. Patient could be further to interventional radiology tomorrow for possible vertebral plasty. Final diagnoses:  None  Dx #1 fall  #2 lumbar compression fracture  #3hyperglycemia    Doug Sou, MD 06/01/15 2245

## 2015-06-01 NOTE — ED Notes (Signed)
Per [facility] pt from [facility] facility, had fall on Sunday, has been seen yesterday for the fall, xray were negative for fracture. Pt co worsening  lower back pain . Pt was ambulatory at the facility yet with severe pain with movement per ems per staff, giben hydrocodone at 1630pm with no relief. Upon tranfer from stretcher to bed , pt was in severe pain as well. Alert and oriented x 4.

## 2015-06-01 NOTE — ED Notes (Signed)
Pt able to ambulate from bed to door with assistance and on a walker.

## 2015-06-01 NOTE — Discharge Instructions (Signed)
The x rays of your back show no fracture  You have been give a prescription for pain control You can also use warm compresses to the sore area on your back

## 2015-06-02 ENCOUNTER — Other Ambulatory Visit: Payer: Self-pay | Admitting: *Deleted

## 2015-06-02 ENCOUNTER — Observation Stay (HOSPITAL_COMMUNITY): Payer: Medicare Other

## 2015-06-02 DIAGNOSIS — I350 Nonrheumatic aortic (valve) stenosis: Secondary | ICD-10-CM

## 2015-06-02 DIAGNOSIS — S32000A Wedge compression fracture of unspecified lumbar vertebra, initial encounter for closed fracture: Secondary | ICD-10-CM | POA: Diagnosis present

## 2015-06-02 DIAGNOSIS — E785 Hyperlipidemia, unspecified: Secondary | ICD-10-CM

## 2015-06-02 DIAGNOSIS — R0600 Dyspnea, unspecified: Secondary | ICD-10-CM

## 2015-06-02 DIAGNOSIS — K219 Gastro-esophageal reflux disease without esophagitis: Secondary | ICD-10-CM

## 2015-06-02 DIAGNOSIS — M545 Low back pain: Secondary | ICD-10-CM

## 2015-06-02 DIAGNOSIS — S32029A Unspecified fracture of second lumbar vertebra, initial encounter for closed fracture: Principal | ICD-10-CM

## 2015-06-02 DIAGNOSIS — I10 Essential (primary) hypertension: Secondary | ICD-10-CM

## 2015-06-02 LAB — COMPREHENSIVE METABOLIC PANEL
ALT: 46 U/L (ref 14–54)
ANION GAP: 10 (ref 5–15)
AST: 49 U/L — ABNORMAL HIGH (ref 15–41)
Albumin: 3.9 g/dL (ref 3.5–5.0)
Alkaline Phosphatase: 96 U/L (ref 38–126)
BILIRUBIN TOTAL: 1.1 mg/dL (ref 0.3–1.2)
BUN: 11 mg/dL (ref 6–20)
CHLORIDE: 105 mmol/L (ref 101–111)
CO2: 25 mmol/L (ref 22–32)
Calcium: 9.2 mg/dL (ref 8.9–10.3)
Creatinine, Ser: 0.67 mg/dL (ref 0.44–1.00)
GFR calc Af Amer: >60 mL/min (ref >60)
Glucose, Bld: 119 mg/dL — ABNORMAL HIGH (ref 65–99)
POTASSIUM: 3.7 mmol/L (ref 3.5–5.1)
Sodium: 140 mmol/L (ref 135–145)
TOTAL PROTEIN: 7.1 g/dL (ref 6.5–8.1)

## 2015-06-02 LAB — PROTIME-INR
INR: 1.11 (ref 0.00–1.49)
Prothrombin Time: 14.5 seconds (ref 11.6–15.2)

## 2015-06-02 LAB — CBC
HEMATOCRIT: 41.2 % (ref 36.0–46.0)
Hemoglobin: 13.5 g/dL (ref 12.0–15.0)
MCH: 30.3 pg (ref 26.0–34.0)
MCHC: 32.8 g/dL (ref 30.0–36.0)
MCV: 92.6 fL (ref 78.0–100.0)
PLATELETS: 180 10*3/uL (ref 150–400)
RBC: 4.45 MIL/uL (ref 3.87–5.11)
RDW: 13.7 % (ref 11.5–15.5)
WBC: 9.4 10*3/uL (ref 4.0–10.5)

## 2015-06-02 LAB — GLUCOSE, CAPILLARY: GLUCOSE-CAPILLARY: 91 mg/dL (ref 65–99)

## 2015-06-02 LAB — TSH: TSH: 0.939 u[IU]/mL (ref 0.350–4.500)

## 2015-06-02 MED ORDER — KETOROLAC TROMETHAMINE 15 MG/ML IJ SOLN
15.0000 mg | Freq: Three times a day (TID) | INTRAMUSCULAR | Status: DC
  Administered 2015-06-02 – 2015-06-03 (×2): 15 mg via INTRAVENOUS
  Filled 2015-06-02 (×5): qty 1

## 2015-06-02 MED ORDER — KETOROLAC TROMETHAMINE 30 MG/ML IJ SOLN: 15.0000 mg | Freq: Three times a day (TID) | INTRAMUSCULAR | Status: DC

## 2015-06-02 MED ORDER — HYDRALAZINE HCL 20 MG/ML IJ SOLN
10.0000 mg | INTRAMUSCULAR | Status: DC | PRN
  Administered 2015-06-02: 10 mg via INTRAVENOUS
  Filled 2015-06-02: qty 1

## 2015-06-02 NOTE — Progress Notes (Signed)
[facility] Progress Note    Patient: Alyssa FoersterMarian Cummings    ZOX:[id number]  DOB: 1917/10/13     DOA: 06/01/2015 Date of Service: the patient was seen and examined on 06/02/2015  Subjective: patient has been having episodic confusion. Having issues with compliance of medication regimen. Not agitated. Nurse at evening reported that the patient has been more confuse since her arrival and also has been complaining of neck pain on bending it forward. Nutrition: able to tolerate oral diet Activity: bed ridden at present Last BM: 05/31/2015  Assessment and Plan: 1. Severe lumbar pain Patient has h/o prior falls and vertebral augmentation Patient fell on 05/30/2015 and was seen in the ER for the same, her pain was well controlled, she was able to ambulate and was d/c back. She returned back due to further worsening of her pain and confusion. Ortho suggest outpatient follow up. IR suggest attempt at pain control with medication and they will re-evaluate the pt tomorrow. Patient's confusion is limiting the pain controll at present. Will use toradol.  2.acute encephalopathy and neck pain Patient has sustained a fall leading to compression fracture of lumber spine. She has some facial injury as well. She complains of neck pain and has been more confused. At present she will be NPO and will get CT head as well as CT C spine. Also get speech evalu to check her swallowing as well.  3. Hypertension Will switch to IV medication  4. GERD Continue PPI  DVT Prophylaxis: mechanical compression device. Nutrition: npo Advance goals of care discussion: full code per admission  Brief Summary of Hospitalization:  HPI: As per the H and P dictated on admission, "Alyssa FoersterMarian Cummings is a 81 y.o. female with history of HTN HLD GERD DJD presents with pain in lumbar area. [facility]. Patient apparently took a fall on Sunday at her nursing facility. Patients pain had  gotten worse and was evaluated. Patient was treated and discharged. Her daughter spoke with her PCP Dr Hyacinth MeekerMiller and was advised to come back into the ED for assessment. Patient had no LOC on the original fall. She apparently lndded on her buttocks. She has no weakness noted. She has sever pain however and so needs pain control. On her Xray there was presence of L2 compression fracture. She has had a prior compression fracture and at that time had a kyphoplasty." Daily update: 06/02/2015 discuss with case manager as well as other consultants. Consultants: IR, phone disccusion with ortho Procedures: none Antibiotics: Anti-infectives    None      Family Communication: no family was present at bedside, at the time of interview.   Disposition:  Expected discharge date:06/03/2015 Barriers to safe discharge: pain management   Intake/Output Summary (Last 24 hours) at 06/02/15 1754 Last data filed at 06/02/15 1400  Gross per 24 hour  Intake 920.83 ml  Output    550 ml  Net 370.83 ml   Filed Weights   06/01/15 2304  Weight: 53.071 kg (117 lb)    Objective: Physical Exam: Filed Vitals:   06/01/15 2304 06/02/15 0450 06/02/15 0931 06/02/15 1621  BP:  142/78 150/65 160/80  Pulse:  86 88 82  Temp:  99 F (37.2 C) 98.2 F (36.8 C) 98.3 F (36.8 C)  TempSrc:  Oral Oral Oral  Resp:  18 16 16   Height: 5' (1.524 m)     Weight: 53.071 kg (117 lb)     SpO2:  94% 95%  96%     General: Appear in mild distress, no Rash; Oral Mucosa moist. Cardiovascular: S1 and S2 Present, no Murmur, no JVD Respiratory: Bilateral Air entry present and Clear to Auscultation, no Crackles, no wheezes Abdomen: Bowel Sound present, Soft and no tenderness Extremities: no Pedal edema, no calf tenderness Neurology: confusion but no focal deficit  Data Reviewed: CBC:  Recent Labs Lab 06/01/15 2035 06/01/15 2056 06/02/15 0430  WBC  --  10.4 9.4  HGB 13.3 13.1 13.5  HCT 39.0 40.0 41.2  MCV  --  92.2 92.6    PLT  --  157 180   Basic Metabolic Panel:  Recent Labs Lab 06/01/15 2035 06/02/15 0430  NA 140 140  K 4.0 3.7  CL 105 105  CO2  --  25  GLUCOSE 144* 119*  BUN 11 11  CREATININE 0.60 0.67  CALCIUM  --  9.2   Liver Function Tests:  Recent Labs Lab 06/02/15 0430  AST 49*  ALT 46  ALKPHOS 96  BILITOT 1.1  PROT 7.1  ALBUMIN 3.9   No results for input(s): LIPASE, AMYLASE in the last 168 hours. No results for input(s): AMMONIA in the last 168 hours.  Cardiac Enzymes: No results for input(s): CKTOTAL, CKMB, CKMBINDEX, TROPONINI in the last 168 hours. BNP (last 3 results) No results for input(s): BNP in the last 8760 hours.  ProBNP (last 3 results) No results for input(s): PROBNP in the last 8760 hours.   CBG:  Recent Labs Lab 06/02/15 0712  GLUCAP 91    No results found for this or any previous visit (from the past 240 hour(s)).   Studies: Ct Lumbar Spine Wo Contrast  06/01/2015  CLINICAL DATA:  Fall on Sunday. Worsening lower back pain. Prior vertebroplasty. EXAM: CT LUMBAR SPINE WITHOUT CONTRAST TECHNIQUE: Multidetector CT imaging of the lumbar spine was performed without intravenous contrast administration. Multiplanar CT image reconstructions were also generated. COMPARISON:  Plain films of 1 day prior and CT of 10/17/2012 FINDINGS: Soft tissues: Abdominal aortic atherosclerosis. Extensive colonic diverticulosis. No paravertebral hematoma. Bones: Osteopenia. Bone island in the left iliac. Multilevel lumbar facet arthropathy. Prior vertebral augmentation at the L1 vertebral body. An underlying moderate compression deformity with ventral canal encroachment is grossly similar to 10/17/2012 otherwise. L2 superior endplate compression deformity is subacute. Linear fracture lines identified including on image 31/ series 8. Mild (20%) vertebral body height loss with sclerosis of the superior aspect of the vertebral body. Minimal ventral canal encroachment including on  image 29/series 8. No extension into the posterior elements Remainder vertebral body height is maintained. Grade 1 L3-4 anterolisthesis is unchanged. mild convex left lumbar spine curvature. Multifactorial central canal stenosis at L3-4 and L4-5. Prior laminectomies at L3-4. IMPRESSION: 1. Subacute superior endplate compression deformity at L2. Minimal canal encroachment with approximately 20% vertebral body height loss. 2. Chronic L1 compression deformity, status post vertebral augmentation. 3. Spondylosis with chronic L3-4 grade 1 anterolisthesis. Electronically Signed   By: Jeronimo Greaves M.D.   On: 06/01/2015 20:58     Scheduled Meds: . amitriptyline  50 mg Oral QHS  . amLODipine  5 mg Oral q morning - 10a  . atorvastatin  40 mg Oral QHS  . carvedilol  3.125 mg Oral BID WC  . docusate sodium  100 mg Oral QHS  . fesoterodine  4 mg Oral Daily  . folic acid  1 mg Oral Daily  . ketorolac  15 mg Intravenous 3 times per day  . latanoprost  1 drop Both Eyes QHS  . multivitamin with minerals  1 tablet Oral Daily  . pantoprazole  40 mg Oral Daily  . thiamine  100 mg Oral Daily   Continuous Infusions: . sodium chloride 50 mL/hr at 06/01/15 2341   PRN Meds: acetaminophen **OR** acetaminophen, bisacodyl, hydrALAZINE, HYDROcodone-acetaminophen, methocarbamol, ondansetron **OR** ondansetron (ZOFRAN) IV, polyethylene glycol  Time spent: 30 minutes  Author: Lynden Oxford, MD [facility] Pager: [phone] 06/02/2015 5:54 PM  If 7PM-7AM, please contact night-coverage at [email or web address], password [facility]

## 2015-06-02 NOTE — Progress Notes (Addendum)
Patient ID: Alyssa FoersterMarian Snoke, female   DOB: 1918/05/15, 81 y.o.   MRN: [id number] Request received for consideration of vertebroplasty /kyphoplasty on patient. Patient with noted history of L1 vertebroplasty in May 2014 by Dr. Corliss Skainseveshwar. Now presenting with L2 fracture on latest CT scan. Plan to review images and make recommendations afterwards. In briefly discussing case with the patient today she stated that she would prefer to talk with family first before undergoing any additional procedures. She is fearful that undergoing another vertebroplasty/kyphoplasty could prolong her hospital stay and interfere with Christmas plans. Should patient decline to proceed at this time can schedule her for outpatient consultation with Dr. Corliss Skainseveshwar after Christmas.

## 2015-06-02 NOTE — Progress Notes (Signed)
Understood, probably will have to delay cath. Will check tomorrow.

## 2015-06-02 NOTE — Progress Notes (Signed)
Spoke with Jeananne RamaKevin Allred, IR to ascertain plan for this pt. Ardelle AntonWagoner, MD will be in on Friday 06/04/2015 to review this case and make recommendations. Caryn BeeKevin says exact plan is unclear at this time. More will be revealed on Friday. CM consulted with senior CM staff for alternatives for this pt and family. Transport home to [facility], by Ambulance (family says this is the major barrier to taking her home) with caregivers and outpatient treatment (Vertebroplasty)  seems to be appropriate treatment plan at this time. CM made Allena KatzPatel, MD aware of above. Called Pam to reinforce Code 44 status. Will continue to follow.

## 2015-06-02 NOTE — Progress Notes (Signed)
CM Spoke with patients daughter to explain Code 1444  and to obtain info about current mental state of pt. Pam, pts daughter, had visited at lunch and states mother is Currently at baseline. She may be  easily confused because of hospital surroundings but otherwise at her norm. Both she and pts son would like for pt to have Vertebroplasty while she is here in the hospital but they are willing to make arrangements for pt to go back to [facility] which is [place], with caregiver by Ambulance if there is No other choice. This CM will call Pam back today with advisement.  Cephus SlaterPam W:: [phone]   C: [phone]

## 2015-06-02 NOTE — [place]2 (Signed)
[place] MEDICAID FL2 LEVEL OF CARE SCREENING TOOL     IDENTIFICATION  Patient Name: Alyssa Cummings Birthdate: 1918-03-30 Sex: female Admission Date (Current Location): 06/01/2015  [facility] and [place] Number:     Facility and Address:  [facility],  [address], [place] [address]      Provider Number: [id number]  Attending Physician Name and Address:  Rolly Salter, MD  Relative Name and Phone Number:       Current Level of Care: SNF Recommended Level of Care: Skilled Nursing Facility Prior Approval Number:    Date Approved/Denied:   PASRR Number:    Discharge Plan: SNF    Current Diagnoses: Patient Active Problem List   Diagnosis Date Noted  . Compression fx, lumbar spine (HCC) 06/02/2015  . GERD (gastroesophageal reflux disease) 06/01/2015  . L2 vertebral fracture (HCC) 06/01/2015  . Severe lumbar pain 06/01/2015  . Lumbar pain 06/01/2015  . Essential hypertension 05/12/2014  . Aortic stenosis, moderate 05/12/2014  . Hyperlipidemia 05/12/2014  . Elevated troponin 04/13/2014    Orientation RESPIRATION BLADDER Height & Weight    Self, Situation, Place  Normal Continent 5' (152.4 cm) 117 lbs.  BEHAVIORAL SYMPTOMS/MOOD NEUROLOGICAL BOWEL NUTRITION STATUS   (no behaviors)   Continent Diet (soft diet)  AMBULATORY STATUS COMMUNICATION OF NEEDS Skin   Extensive Assist Verbally Normal                       Personal Care Assistance Level of Assistance  Bathing, Feeding, Dressing Bathing Assistance: Maximum assistance Feeding assistance: Limited assistance Dressing Assistance: Maximum assistance     Functional Limitations Info  Sight, Hearing, Speech Sight Info: Impaired Hearing Info: Adequate Speech Info: Adequate    SPECIAL CARE FACTORS FREQUENCY  PT (By licensed PT), OT (By licensed OT)     PT Frequency: 5 x wk OT Frequency: 5 x wk            Contractures Contractures Info: Not present    Additional Factors Info   Code Status               Current Medications (06/02/2015):  This is the current hospital active medication list Current Facility-Administered Medications  Medication Dose Route Frequency Provider Last Rate Last Dose  . 0.9 %  sodium chloride infusion   Intravenous Continuous Yevonne Pax, MD 50 mL/hr at 06/01/15 2341    . acetaminophen (TYLENOL) tablet 650 mg  650 mg Oral Q6H PRN Yevonne Pax, MD       Or  . acetaminophen (TYLENOL) suppository 650 mg  650 mg Rectal Q6H PRN Yevonne Pax, MD      . amitriptyline (ELAVIL) tablet 50 mg  50 mg Oral QHS Yevonne Pax, MD   50 mg at 06/01/15 2342  . amLODipine (NORVASC) tablet 5 mg  5 mg Oral q morning - 10a Yevonne Pax, MD   5 mg at 06/02/15 1121  . atorvastatin (LIPITOR) tablet 40 mg  40 mg Oral QHS Yevonne Pax, MD   40 mg at 06/01/15 2342  . bisacodyl (DULCOLAX) suppository 10 mg  10 mg Rectal Daily PRN Yevonne Pax, MD      . carvedilol (COREG) tablet 3.125 mg  3.125 mg Oral BID WC Yevonne Pax, MD   3.125 mg at 06/02/15 0743  . docusate sodium (COLACE) capsule 100 mg  100 mg Oral QHS Yevonne Pax, MD   100 mg at 06/01/15 2320  .  fesoterodine (TOVIAZ) tablet 4 mg  4 mg Oral Daily Yevonne PaxSaadat A Khan, MD   4 mg at 06/02/15 1121  . folic acid (FOLVITE) tablet 1 mg  1 mg Oral Daily Yevonne PaxSaadat A Khan, MD   1 mg at 06/02/15 1121  . HYDROcodone-acetaminophen (NORCO/VICODIN) 5-325 MG per tablet 1-2 tablet  1-2 tablet Oral Q4H PRN Leanne ChangKatherine P Schorr, NP   1 tablet at 06/02/15 1247  . latanoprost (XALATAN) 0.005 % ophthalmic solution 1 drop  1 drop Both Eyes QHS Yevonne PaxSaadat A Khan, MD   1 drop at 06/01/15 2341  . lisinopril (PRINIVIL,ZESTRIL) tablet 40 mg  40 mg Oral q morning - 10a Yevonne PaxSaadat A Khan, MD   40 mg at 06/02/15 1120  . methocarbamol (ROBAXIN) tablet 750 mg  750 mg Oral Q6H PRN Roma KayserKatherine P Schorr, NP   750 mg at 06/02/15 0841  . multivitamin with minerals tablet 1 tablet  1 tablet Oral Daily Yevonne PaxSaadat A Khan, MD   1 tablet at 06/02/15 1120  .  ondansetron (ZOFRAN) tablet 4 mg  4 mg Oral Q6H PRN Yevonne PaxSaadat A Khan, MD       Or  . ondansetron [facility](ZOFRAN) injection 4 mg  4 mg Intravenous Q6H PRN Yevonne PaxSaadat A Khan, MD      . pantoprazole (PROTONIX) EC tablet 40 mg  40 mg Oral Daily Yevonne PaxSaadat A Khan, MD   40 mg at 06/02/15 1121  . polyethylene glycol (MIRALAX / GLYCOLAX) packet 17 g  17 g Oral Daily PRN Yevonne PaxSaadat A Khan, MD      . thiamine (VITAMIN B-1) tablet 100 mg  100 mg Oral Daily Yevonne PaxSaadat A Khan, MD   100 mg at 06/02/15 1121     Discharge Medications: Please see discharge summary for a list of discharge medications.  Relevant Imaging Results:  Relevant Lab Results:   Additional Information SS # [id number]  Genecis Veley, Dickey GaveJamie Lee, LCSW

## 2015-06-02 NOTE — Clinical Social Work Note (Signed)
Clinical Social Work Assessment  Patient Details  Name: Alyssa Cummings MRN: [id number] Date of Birth: [birth year]  Date of referral:  06/02/15               Reason for consult:  Discharge Planning, Facility Placement                Permission sought to share information with:    Permission granted to share information::     Name::        Agency::     Relationship::     Contact Information:     Housing/Transportation Living arrangements for the past 2 months:  25, Charity fundraiser of Information:  Patient, Adult Children Patient Interpreter Needed:  None Criminal Activity/Legal Involvement Pertinent to Current Situation/Hospitalization:  No - Comment as needed Significant Relationships:  Adult Children Lives with:  Self Do you feel safe going back to the place where you live?   (Possibly with increased assistance.) Need for family participation in patient care:  Yes (Comment)  Care giving concerns: Pt resides at [facility] apt. Pt has home health services. Daughter may increase amount of assistance at home, if needed.   Social Worker assessment / plan:  Pt hospitalized 06/01/15 with severe lumbar pain / compression fx . Vertebroplasty / kyphoplasty is being considered. Pt 's hospital status has been changed today from in patient to observation. CSW met with pt briefly this am. Pt requested her daughter, Betha Loa ( [phone] / [phone] ) be contacted. CSW has contacted daughter to assist with d/c planning. Daughter is hoping pt will have procedure which has in the past relieved her pain. Pt will then be able to return home with continued [facility] services. CSW as explained to daughter that if SNF placement is needed medicare will not cover cost of placement. CSW is able to assist with private pay placement. Treatment / disposition has not yet been determined. CSW will remain available to assist with d/c planning needs. RNCM is also assisting with  d/c planning. NSG reports that a Air cabin crew has been needed due to in creased confusion. And pt attempting to get of bed.  Employment status:  Retired Forensic scientist:  Medicare PT Recommendations:  Not assessed at this time Information / Referral to community resources:     Patient/Family's Response to care:  Treatment / disposition has not yet been determined.  Patient/Family's Understanding of and Emotional Response to Diagnosis, Current Treatment, and Prognosis:  Pt is uncomfortable and experiencing some confusion. Daughter is frustrated with medicare insurance issues. RNCM is trying to assist with explanation of insurance coverage. CSW has offered support and will provide on going assistance with d/c planning.  Emotional Assessment Appearance:  Appears stated age Attitude/Demeanor/Rapport:  Other (uncomfortable due to mpain) Affect (typically observed):  Restless Orientation:  Oriented to Self, Oriented to Place, Oriented to Situation, Fluctuating Orientation (Suspected and/or reported Sundowners) Alcohol / Substance use:  Other Psych involvement (Current and /or in the community):  No (Comment)  Discharge Needs  Concerns to be addressed:  Discharge Planning Concerns Readmission within the last 30 days:  No Current discharge risk:  None Barriers to Discharge:  No Barriers Identified   Loraine Maple  [phone] 06/02/2015, 3:27 PM

## 2015-06-03 DIAGNOSIS — I350 Nonrheumatic aortic (valve) stenosis: Secondary | ICD-10-CM

## 2015-06-03 DIAGNOSIS — G9340 Encephalopathy, unspecified: Secondary | ICD-10-CM

## 2015-06-03 LAB — CBC WITH DIFFERENTIAL/PLATELET
Basophils Absolute: 0 10*3/uL (ref 0.0–0.1)
Basophils Relative: 0 %
EOS ABS: 0.1 10*3/uL (ref 0.0–0.7)
EOS PCT: 1 %
HCT: 37.8 % (ref 36.0–46.0)
HEMOGLOBIN: 12.2 g/dL (ref 12.0–15.0)
LYMPHS ABS: 0.8 10*3/uL (ref 0.7–4.0)
LYMPHS PCT: 8 %
MCH: 30.3 pg (ref 26.0–34.0)
MCHC: 32.3 g/dL (ref 30.0–36.0)
MCV: 93.8 fL (ref 78.0–100.0)
MONOS PCT: 11 %
Monocytes Absolute: 1.1 10*3/uL — ABNORMAL HIGH (ref 0.1–1.0)
Neutro Abs: 7.6 10*3/uL (ref 1.7–7.7)
Neutrophils Relative %: 80 %
PLATELETS: 171 10*3/uL (ref 150–400)
RBC: 4.03 MIL/uL (ref 3.87–5.11)
RDW: 13.8 % (ref 11.5–15.5)
WBC: 9.6 10*3/uL (ref 4.0–10.5)

## 2015-06-03 LAB — COMPREHENSIVE METABOLIC PANEL
ALK PHOS: 92 U/L (ref 38–126)
ALT: 37 U/L (ref 14–54)
ANION GAP: 10 (ref 5–15)
AST: 28 U/L (ref 15–41)
Albumin: 3.3 g/dL — ABNORMAL LOW (ref 3.5–5.0)
BUN: 17 mg/dL (ref 6–20)
CALCIUM: 8.9 mg/dL (ref 8.9–10.3)
CO2: 24 mmol/L (ref 22–32)
CREATININE: 0.71 mg/dL (ref 0.44–1.00)
Chloride: 108 mmol/L (ref 101–111)
Glucose, Bld: 126 mg/dL — ABNORMAL HIGH (ref 65–99)
Potassium: 4 mmol/L (ref 3.5–5.1)
SODIUM: 142 mmol/L (ref 135–145)
TOTAL PROTEIN: 6.5 g/dL (ref 6.5–8.1)
Total Bilirubin: 0.8 mg/dL (ref 0.3–1.2)

## 2015-06-03 LAB — GLUCOSE, CAPILLARY
Glucose-Capillary: 85 mg/dL (ref 65–99)
Glucose-Capillary: 103 mg/dL — ABNORMAL HIGH (ref 65–99)

## 2015-06-03 LAB — PROTIME-INR
INR: 1.17 (ref 0.00–1.49)
PROTHROMBIN TIME: 15.1 s (ref 11.6–15.2)

## 2015-06-03 LAB — HEMOGLOBIN A1C
Hgb A1c MFr Bld: 5.6 % (ref 4.8–5.6)
MEAN PLASMA GLUCOSE: 114 mg/dL

## 2015-06-03 MED ORDER — METHOCARBAMOL 500 MG PO TABS: 500.0000 mg | ORAL_TABLET | Freq: Four times a day (QID) | ORAL | Status: DC | PRN

## 2015-06-03 MED ORDER — LORAZEPAM 2 MG/ML IJ SOLN
0.5000 mg | Freq: Once | INTRAMUSCULAR | Status: AC
  Administered 2015-06-03: 0.5 mg via INTRAVENOUS
  Filled 2015-06-03: qty 1

## 2015-06-03 MED ORDER — NAPROXEN 500 MG PO TABS: 500.0000 mg | ORAL_TABLET | Freq: Three times a day (TID) | ORAL | Status: DC

## 2015-06-03 MED ORDER — POLYETHYLENE GLYCOL 3350 17 G PO PACK: 17.0000 g | PACK | Freq: Every day | ORAL | Status: DC | PRN

## 2015-06-03 MED ORDER — MUSCLE RUB 10-15 % EX CREA
TOPICAL_CREAM | CUTANEOUS | Status: DC | PRN
  Filled 2015-06-03: qty 85

## 2015-06-03 MED ORDER — LIDOCAINE 5 % EX PTCH: 1.0000 | MEDICATED_PATCH | CUTANEOUS | Status: DC

## 2015-06-03 MED ORDER — POLYETHYLENE GLYCOL 3350 17 G PO PACK: 17.0000 g | PACK | Freq: Every day | ORAL | Status: AC | PRN

## 2015-06-03 MED ORDER — METHOCARBAMOL 750 MG PO TABS: 750.0000 mg | ORAL_TABLET | Freq: Four times a day (QID) | ORAL | Status: DC | PRN

## 2015-06-03 MED ORDER — TRAMADOL HCL 50 MG PO TABS: 25.0000 mg | ORAL_TABLET | Freq: Four times a day (QID) | ORAL | Status: DC | PRN

## 2015-06-03 MED ORDER — LIDOCAINE 5 % EX PTCH
1.0000 | MEDICATED_PATCH | CUTANEOUS | Status: DC
  Administered 2015-06-03: 1 via TRANSDERMAL
  Filled 2015-06-03 (×2): qty 1

## 2015-06-03 MED ORDER — LIDOCAINE 5 % EX PTCH
1.0000 | MEDICATED_PATCH | CUTANEOUS | Status: DC
  Administered 2015-06-03 (×2): 1 via TRANSDERMAL
  Filled 2015-06-03: qty 1

## 2015-06-03 NOTE — Evaluation (Signed)
Physical Therapy Evaluation Patient Details Name: Alyssa Cummings MRN: [id number] DOB: [birth year] Today's Date: 06/03/2015   History of Present Illness  Alyssa Cummings is a 81 y.o. female with history of HTN HLD GERD DJD presents with pain in lumbar area. Patient was seen in the [facility] ED 12/12 after she fell. Patient apparently took a fall on Sunday at her independent living. Patients pain had gotten worse and was evaluated. Patient was treated and discharged. Her daughter spoke with her, increased pain, and was advised to come back into the ED for assessment. Patient had no LOC on the original fall. . She has no weakness noted. She has severe pain however and so needs pain control. On her Xray there was presence of L2 compression fracture. She has had a prior compression fracture and at that time had a kyphoplasty  Clinical Impression  Patient pleasant and confused and able to participate. Assisted  To sitting on the edge of the bed with total assist. Stood x 1 at RW, knees buckled with onset of back pain. Assisted  Back into bed. Pt admitted with above diagnosis. Pt currently with functional limitations due to the deficits listed below (see PT Problem List).  Pt will benefit from skilled PT to increase their independence and safety with mobility to allow discharge to independent Living with 24/7 caregivers.    Follow Up Recommendations Home health PT;Supervision/Assistance - 24 hour    Equipment Recommendations  Hospital bed    Recommendations for Other Services       Precautions / Restrictions Precautions Precautions: Fall;Back Precaution Comments: Knees buckled when standing-unwarned. Required Braces or Orthoses:  (MD states  neuro surg has been consulted for need for brace.)      Mobility  Bed Mobility Overal bed mobility: Needs Assistance;+2 for physical assistance;+ 2 for safety/equipment Bed Mobility: Rolling;Sidelying to Sit;Sit to Sidelying Rolling: Mod assist Sidelying to  sit: Total assist;+2 for physical assistance;+2 for safety/equipment;HOB elevated     Sit to sidelying: Total assist;+2 for physical assistance;+2 for safety/equipment General bed mobility comments: In the bed, patient found with neck hyperextended. multimodal cues for rolling, use of the bed pad to roll and then to assist  trunk into upright and scoot to the edge.Assisted patient back into bed with use of the bed pad. Left in bed /chair position with pillows supporting  Upper body, head posture much better.move patient to the  Transfers                    Ambulation/Gait                Stairs            Wheelchair Mobility    Modified Rankin (Stroke Patients Only)       Balance Overall balance assessment: Needs assistance;History of Falls Sitting-balance support: Bilateral upper extremity supported;Feet supported Sitting balance-Leahy Scale: Poor Sitting balance - Comments: max use of the  upper body  for trunk support,    Standing balance support: Bilateral upper extremity supported Standing balance-Leahy Scale: Zero Standing balance comment: legs buckled after standing x 10 seconds                             Pertinent Vitals/Pain Pain Assessment: Faces Faces Pain Scale: Hurts worst Pain Location: c/o neck pain, then , upon standing, patient stated  that she had a severe pain on the R and the  knees buckled, Pain Descriptors /  Indicators: Contraction;Cramping;Discomfort;Grimacing;Guarding Pain Intervention(s): Limited activity within patient's tolerance;Monitored during session;Premedicated before session;Repositioned;Heat applied    Home Living Family/patient expects to be discharged to:: Assisted living               Home Equipment: Walker - 4 wheels      Prior Function Level of Independence: Independent with assistive device(s)         Comments: uses rollator     Hand Dominance   Dominant Hand: Right     Extremity/Trunk Assessment   Upper Extremity Assessment: Generalized weakness           Lower Extremity Assessment: Generalized weakness      Cervical / Trunk Assessment: Kyphotic;Other exceptions  Communication   Communication: HOH  Cognition Arousal/Alertness: Awake/alert Behavior During Therapy: WFL for tasks assessed/performed Overall Cognitive Status: Impaired/Different from baseline Area of Impairment: Orientation;Awareness;Problem solving Orientation Level: Place;Time;Situation           Problem Solving: Slow processing General Comments: HOH is a deterrent    General Comments      Exercises Other Exercises Other Exercises:  arom neck in sitting on the  bed edge.      Assessment/Plan    PT Assessment Patient needs continued PT services  PT Diagnosis Difficulty walking;Generalized weakness;Acute pain;Altered mental status   PT Problem List Decreased strength;Decreased range of motion;Decreased activity tolerance;Decreased balance;Decreased mobility;Decreased cognition;Decreased knowledge of precautions;Decreased safety awareness;Decreased knowledge of use of DME;Pain  PT Treatment Interventions DME instruction;Gait training;Functional mobility training;Therapeutic activities;Therapeutic exercise;Patient/family education   PT Goals (Current goals can be found in the Care Plan section) Acute Rehab PT Goals Patient Stated Goal: none stated PT Goal Formulation: Patient unable to participate in goal setting Time For Goal Achievement: 06/17/15 Potential to Achieve Goals: Fair    Frequency Min 3X/week   Barriers to discharge Decreased caregiver support      Co-evaluation               End of Session   Activity Tolerance: Patient limited by pain Patient left: in bed;with call bell/phone within reach;with bed alarm set;with nursing/sitter in room Nurse Communication: Mobility status    Functional Assessment Tool Used: clinical judgemnet Functional  Limitation: Mobility: Walking and moving around Mobility: Walking and Moving Around Current Status ([id number](G8978): 100 percent impaired, limited or restricted Mobility: Walking and Moving Around Goal Status (U0454(G8979): At least 1 percent but less than 20 percent impaired, limited or restricted    Time: 1111-1125 PT Time Calculation (min) (ACUTE ONLY): 14 min   Charges:   PT Evaluation $Initial PT Evaluation Tier I: 1 Procedure     PT G Codes:   PT G-Codes **NOT FOR INPATIENT CLASS** Functional Assessment Tool Used: clinical judgemnet Functional Limitation: Mobility: Walking and moving around Mobility: Walking and Moving Around Current Status (U9811(G8978): 100 percent impaired, limited or restricted Mobility: Walking and Moving Around Goal Status (B1478(G8979): At least 1 percent but less than 20 percent impaired, limited or restricted    Sharen HeckHill, Alyssa Cummings PT [phone]   06/03/2015, 11:58 AM

## 2015-06-03 NOTE — Discharge Summary (Signed)
[facility] Discharge Summary   Patient: Alyssa Cummings    ZOX:[id number] PCP: Neldon Labella, MD   DOB: May 25, 1918 Date of admission: 06/01/2015   Date of discharge: 06/03/2015   Discharge Diagnoses:  Principal Problem:   Severe lumbar pain Active Problems:   Essential hypertension   Hyperlipidemia   GERD (gastroesophageal reflux disease)   L2 vertebral fracture (HCC)   Lumbar pain   Compression fx, lumbar spine (HCC)   Acute encephalopathy   Moderate aortic stenosis   Recommendations for Outpatient Follow-up:  1. Follow-up with interventional radiology Dr. Corliss Skains in 1-2 weeks 2. Follow-up with neurosurgery Dr. Bevely Palmer as needed.  3. Discharging at [facility]  Diet recommendation: Regular diet  Activity: The patient is advised to gradually reintroduce usual activities. With or without TLSO brace  Discharge Condition: good  History of present illness: As per the H and P dictated on admission, "Alyssa Cummings is a 81 y.o. female with history of HTN HLD GERD DJD presents with pain in lumbar area. Patient was seen in the [facility] ED yesterday after she fell. Patient apparently took a fall on Sunday at her nursing facility. Patients pain had gotten worse and was evaluated. Patient was treated and discharged. Her daughter spoke with her PCP Dr Hyacinth Meeker and was advised to come back into the ED for assessment. Patient had no LOC on the original fall. She apparently lndded on her buttocks. She has no weakness noted. She has sever pain however and so needs pain control. On her Xray there was presence of L2 compression fracture. She has had a prior compression fracture and at that time had a kyphoplasty."  Hospital Course:  Summary of her active problems in the hospital is as following. 1. Severe lumbar pain Patient has h/o prior falls and vertebral augmentation Patient fell on 05/30/2015 and was seen in the ER for the same, her pain was well controlled, she was able to ambulate and was  d/c back. She returned back due to further worsening of her pain and confusion. Ortho suggest outpatient follow up. Neurosurgery recommended outpatient follow-up. IR suggest attempt at pain control with medication and they will re-evaluate the pt as an outpatient. Her pain was well controlled with scheduled Toradol and therefore she will be discharged on oral naproxen. Since there was concern about mental status changes she will be discharged on low-dose tramadol as well as low-dose Robaxin. Patient was able to tamponade with physical therapy a few steps but then had reoccurrence of her pain which led to limitation.  2.acute encephalopathy and neck pain Patient has sustained a fall leading to compression fracture of lumber spine. She has some facial injury as well. She complains of neck pain and has been more confused. Her CT scan of the head as well as C-spine were unremarkable for any acute fracture or intracranial abnormality. Her mentation improved significantly most likely appears to be metabolic due to medication. We will use low-dose narcotics for pain management as needed for severe pain only.  3. Hypertension Continuing home medication.  4. GERD Continue PPI    5.   Moderate aortic stenosis Outpatient follow-up with cardiology as needed.  All other chronic medical condition were stable during the hospitalization.  Patient was seen by physical therapy, who recommended home health, which was arranged by Child psychotherapist and case Production designer, theatre/television/film. On the day of the discharge the patient's pain was well controlled, and no other acute medical condition were reported by patient. the patient was felt safe to be discharge  at assisted living facility with physical therapy.  Procedures and Results:  TLSO brace placement   Consultations:  Interventional radiology consultation  Discharge Exam: Filed Weights   06/01/15 2304  Weight: 53.071 kg (117 lb)   Filed Vitals:   06/03/15 0630  06/03/15 1351  BP: 168/76 158/54  Pulse:  73  Temp:  97.3 F (36.3 C)  Resp:  16    General: Appear in no distress, no Rash; Oral Mucosa moist. Cardiovascular: S1 and S2 Present, aortic systolic Murmur, difficult to assess JVD Respiratory: Bilateral Air entry present and Clear to Auscultation, no Crackles,  no wheezes Abdomen: Bowel Sound present, Soft and no tenderness Extremities: no Pedal edema, no calf tenderness Neurology: Grossly no focal neuro deficit.  DISCHARGE MEDICATION: Discharge Instructions    Face-to-face encounter (required for Medicare/Medicaid patients)    Complete by:  As directed   I Demetri Goshert certify that this patient is under my care and that I, or a nurse practitioner or physician's assistant working with me, had a face-to-face encounter that meets the physician face-to-face encounter requirements with this patient on 06/03/2015. The encounter with the patient was in whole, or in part for the following medical condition(s) which is the primary reason for home health care (List medical condition): lumber compression fracture.  The encounter with the patient was in whole, or in part, for the following medical condition, which is the primary reason for home health care:  yes  I certify that, based on my findings, the following services are medically necessary home health services:  Physical therapy  Reason for Medically Necessary Home Health Services:  Therapy- Investment banker, operational, Patent examiner  My clinical findings support the need for the above services:  Pain interferes with ambulation/mobility  Further, I certify that my clinical findings support that this patient is homebound due to:  Pain interferes with ambulation/mobility     Home Health    Complete by:  As directed   To provide the following care/treatments:   PT Home Health Aide            Current Discharge Medication List    START taking these medications   Details  lidocaine  (LIDODERM) 5 % Place 1 patch onto the skin daily. Apply on back, Remove & Discard patch within 12 hours or as directed by MD Qty: 30 patch, Refills: 0    methocarbamol (ROBAXIN) 500 MG tablet Take 1 tablet (500 mg total) by mouth every 6 (six) hours as needed for muscle spasms. Qty: 30 tablet, Refills: 0    naproxen (NAPROSYN) 500 MG tablet Take 1 tablet (500 mg total) by mouth 3 (three) times daily with meals. Qty: 21 tablet, Refills: 0    polyethylene glycol (MIRALAX / GLYCOLAX) packet Take 17 g by mouth daily as needed for mild constipation. Qty: 14 each, Refills: 0    traMADol (ULTRAM) 50 MG tablet Take 0.5 tablets (25 mg total) by mouth every 6 (six) hours as needed for severe pain. Qty: 30 tablet, Refills: 0      CONTINUE these medications which have NOT CHANGED   Details  acetaminophen (TYLENOL) 650 MG CR tablet Take 650 mg by mouth every 6 (six) hours as needed for pain.    alendronate (FOSAMAX) 70 MG tablet Take 70 mg by mouth every 7 (seven) days. Take with a full glass of water on an empty stomach. Take on Saturdays    amitriptyline (ELAVIL) 50 MG tablet Take 50 mg by mouth  at bedtime.    amLODipine (NORVASC) 5 MG tablet Take 5 mg by mouth every morning.     aspirin EC 81 MG EC tablet Take 1 tablet (81 mg total) by mouth daily.    atorvastatin (LIPITOR) 40 MG tablet Take 40 mg by mouth at bedtime.     carvedilol (COREG) 3.125 MG tablet Take 1 tablet (3.125 mg total) by mouth 2 (two) times daily with a meal. Qty: 180 tablet, Refills: 2    docusate sodium (COLACE) 100 MG capsule Take 100 mg by mouth at bedtime.     lansoprazole (PREVACID) 30 MG capsule Take 30 mg by mouth every other day.     latanoprost (XALATAN) 0.005 % ophthalmic solution Place 1 drop into both eyes at bedtime.    lisinopril (PRINIVIL,ZESTRIL) 40 MG tablet Take 40 mg by mouth every morning.     tolterodine (DETROL LA) 2 MG 24 hr capsule Take 2 mg by mouth every morning.      STOP taking these  medications     HYDROcodone-acetaminophen (HYCET) 7.5-325 mg/15 ml solution        Allergies  Allergen Reactions  . Codeine Nausea And Vomiting   Follow-up Information    Follow up with Neldon Labella, MD In 1 week.   Specialty:  Family Medicine   Contact information:   [address] [place] [address] [phone]       Follow up with Loura Halt Ditty, MD. Call in 1 month.   Specialty:  Neurosurgery   Why:  As needed , If symptoms worsen,    Contact information:   [address] [place] [address] [phone]       Follow up with Oneal Grout, MD. Schedule an appointment as soon as possible for a visit in 2 weeks.   Specialty:  Interventional Radiology   Contact information:   [address] [place] [address] [phone]       The results of significant diagnostics from this hospitalization (including imaging, microbiology, ancillary and laboratory) are listed below for reference.    Significant Diagnostic Studies: Dg Thoracic Spine W/swimmers  05/31/2015  CLINICAL DATA:  Status post fall in living room, with upper back pain. Initial encounter. EXAM: THORACIC SPINE - 3 VIEWS COMPARISON:  Chest radiograph performed 04/21/2015 FINDINGS: There is no evidence of fracture or subluxation. Vertebral bodies demonstrate normal height and alignment. Intervertebral disc spaces are preserved. The patient is status post vertebroplasty at the lower thoracic spine. Large anterior osteophytes are noted along the thoracic spine. The visualized portions of both lungs are clear. The mediastinum is unremarkable in appearance. IMPRESSION: 1. No evidence of fracture or subluxation along the thoracic spine. 2. Mild diffuse degenerative change along the thoracic spine. Electronically Signed   By: Roanna Raider M.D.   On: 05/31/2015 23:15   Dg Lumbar Spine Complete  05/31/2015  CLINICAL DATA:  Larey Seat last night in the living room.  Lumbar pain.  EXAM: LUMBAR SPINE - COMPLETE 4+ VIEW COMPARISON:  CT lumbar spine 10/17/2012. Lumbar spine radiographs 10/08/2012. FINDINGS: Diffuse degenerative change throughout the lumbar spine with narrowed lumbar interspaces and associated endplate hypertrophic changes. Multilevel degenerative disc disease. Diffuse bone demineralization likely due to osteoporosis. Anterior compression of the L1 vertebra. Interval kyphoplasty changes at this level. Ballooning of the interspace at L1-2 suggesting developing compression of the superior endplate of L2, likely osteoporotic. No acute fracture is demonstrated. Normal alignment of the lumbar spine. Visualize sacral  struts appear intact. IMPRESSION: Diffuse degenerative change throughout the lumbar spine. Demineralization with vertebral compression deformities consistent with osteoporosis. Compression of superior endplate of L2 since previous study appears represent chronic osteoporotic change. No acute displaced fractures identified. Electronically Signed   By: Burman Nieves M.D.   On: 05/31/2015 23:15   Ct Head Wo Contrast  06/02/2015  CLINICAL DATA:  Reported falls.  Increased confusion. EXAM: CT HEAD WITHOUT CONTRAST CT CERVICAL SPINE WITHOUT CONTRAST TECHNIQUE: Multidetector CT imaging of the head and cervical spine was performed following the standard protocol without intravenous contrast. Multiplanar CT image reconstructions of the cervical spine were also generated. COMPARISON:  Brain MR of 12/09/2014. Head and C-spine CT of 05/31/2014. FINDINGS: CT HEAD FINDINGS Sinuses/Soft tissues: Minimal mucosal thickening of left ethmoid air cells. Minimal motion degradation. No significant soft tissue swelling. No skull fracture. Clear mastoid air cells. Intracranial: Marked low density in the periventricular white matter likely related to small vessel disease. Expected cerebral volume loss for age. Ventriculomegaly which is felt to be secondary. No mass lesion, hemorrhage,  hydrocephalus, acute infarct, intra-axial, or extra-axial fluid collection. CT CERVICAL SPINE FINDINGS Spinal visualization through the bottom of T1. Prevertebral soft tissues are within normal limits. Re- demonstration of a right sided thyroid mass on the order of 3.8 cm. No apical pneumothorax. Mild motion degradation at the level of C1-2. Spondylosis, including degenerate disc disease at C4-5 and disc osteophyte complex. Facet arthropathy at multiple levels. IMPRESSION: 1.  No acute intracranial abnormality. 2. Marked small vessel ischemic change. 3. Cervical spondylosis, without acute fracture or subluxation. 4. Mild motion degradation involving the head and cervical spine (especially at C1-2). Electronically Signed   By: Jeronimo Greaves M.D.   On: 06/02/2015 19:05   Ct Cervical Spine Wo Contrast  06/02/2015  CLINICAL DATA:  Reported falls.  Increased confusion. EXAM: CT HEAD WITHOUT CONTRAST CT CERVICAL SPINE WITHOUT CONTRAST TECHNIQUE: Multidetector CT imaging of the head and cervical spine was performed following the standard protocol without intravenous contrast. Multiplanar CT image reconstructions of the cervical spine were also generated. COMPARISON:  Brain MR of 12/09/2014. Head and C-spine CT of 05/31/2014. FINDINGS: CT HEAD FINDINGS Sinuses/Soft tissues: Minimal mucosal thickening of left ethmoid air cells. Minimal motion degradation. No significant soft tissue swelling. No skull fracture. Clear mastoid air cells. Intracranial: Marked low density in the periventricular white matter likely related to small vessel disease. Expected cerebral volume loss for age. Ventriculomegaly which is felt to be secondary. No mass lesion, hemorrhage, hydrocephalus, acute infarct, intra-axial, or extra-axial fluid collection. CT CERVICAL SPINE FINDINGS Spinal visualization through the bottom of T1. Prevertebral soft tissues are within normal limits. Re- demonstration of a right sided thyroid mass on the order of 3.8  cm. No apical pneumothorax. Mild motion degradation at the level of C1-2. Spondylosis, including degenerate disc disease at C4-5 and disc osteophyte complex. Facet arthropathy at multiple levels. IMPRESSION: 1.  No acute intracranial abnormality. 2. Marked small vessel ischemic change. 3. Cervical spondylosis, without acute fracture or subluxation. 4. Mild motion degradation involving the head and cervical spine (especially at C1-2). Electronically Signed   By: Jeronimo Greaves M.D.   On: 06/02/2015 19:05   Ct Lumbar Spine Wo Contrast  06/01/2015  CLINICAL DATA:  Fall on Sunday. Worsening lower back pain. Prior vertebroplasty. EXAM: CT LUMBAR SPINE WITHOUT CONTRAST TECHNIQUE: Multidetector CT imaging of the lumbar spine was performed without intravenous contrast administration. Multiplanar CT image reconstructions were also generated. COMPARISON:  Plain films of 1  day prior and CT of 10/17/2012 FINDINGS: Soft tissues: Abdominal aortic atherosclerosis. Extensive colonic diverticulosis. No paravertebral hematoma. Bones: Osteopenia. Bone island in the left iliac. Multilevel lumbar facet arthropathy. Prior vertebral augmentation at the L1 vertebral body. An underlying moderate compression deformity with ventral canal encroachment is grossly similar to 10/17/2012 otherwise. L2 superior endplate compression deformity is subacute. Linear fracture lines identified including on image 31/ series 8. Mild (20%) vertebral body height loss with sclerosis of the superior aspect of the vertebral body. Minimal ventral canal encroachment including on image 29/series 8. No extension into the posterior elements Remainder vertebral body height is maintained. Grade 1 L3-4 anterolisthesis is unchanged. mild convex left lumbar spine curvature. Multifactorial central canal stenosis at L3-4 and L4-5. Prior laminectomies at L3-4. IMPRESSION: 1. Subacute superior endplate compression deformity at L2. Minimal canal encroachment with  approximately 20% vertebral body height loss. 2. Chronic L1 compression deformity, status post vertebral augmentation. 3. Spondylosis with chronic L3-4 grade 1 anterolisthesis. Electronically Signed   By: Jeronimo GreavesKyle  Talbot M.D.   On: 06/01/2015 20:58    Microbiology: No results found for this or any previous visit (from the past 240 hour(s)).   Labs: CBC:  Recent Labs Lab 06/01/15 2035 06/01/15 2056 06/02/15 0430 06/03/15 0756  WBC  --  10.4 9.4 9.6  NEUTROABS  --   --   --  7.6  HGB 13.3 13.1 13.5 12.2  HCT 39.0 40.0 41.2 37.8  MCV  --  92.2 92.6 93.8  PLT  --  157 180 171   Basic Metabolic Panel:  Recent Labs Lab 06/01/15 2035 06/02/15 0430 06/03/15 0756  NA 140 140 142  K 4.0 3.7 4.0  CL 105 105 108  CO2  --  25 24  GLUCOSE 144* 119* 126*  BUN 11 11 17   CREATININE 0.60 0.67 0.71  CALCIUM  --  9.2 8.9   Liver Function Tests:  Recent Labs Lab 06/02/15 0430 06/03/15 0756  AST 49* 28  ALT 46 37  ALKPHOS 96 92  BILITOT 1.1 0.8  PROT 7.1 6.5  ALBUMIN 3.9 3.3*   No results for input(s): LIPASE, AMYLASE in the last 168 hours. No results for input(s): AMMONIA in the last 168 hours.  Cardiac Enzymes: No results for input(s): CKTOTAL, CKMB, CKMBINDEX, TROPONINI in the last 168 hours. BNP (last 3 results) No results for input(s): BNP in the last 8760 hours.  ProBNP (last 3 results) No results for input(s): PROBNP in the last 8760 hours.  CBG:  Recent Labs Lab 06/02/15 0712 06/03/15 0715 06/03/15 1129  GLUCAP 91 85 103*    Time spent: 30 minutes  Signed:  Lynden OxfordPATEL, Kyron Schlitt  [facility] 06/03/2015, 5:33 PM

## 2015-06-03 NOTE — Progress Notes (Addendum)
Received call from Almyra Deforestebbie Hill, [place] [facility]. [phone]) She is able to arrange CNAs to manage around the clock care for this patient if we allow her ample time to call them in today. They can manage her toileting needs, medication needs until she can have procedure for pain management if it is not able to be done here in the hospital prior to discharge. Please make sure Bedpan goes to facility with pt as she has DME, RW and BSC but not Bedpan.

## 2015-06-03 NOTE — Progress Notes (Signed)
Physical Therapy Treatment Patient Details Name: Alyssa Cummings MRN: [id number] DOB: 1918-05-13 Today's Date: 06/03/2015    History of Present Illness Alyssa Cummings is a 81 y.o. female with history of HTN HLD GERD DJD presents with pain in lumbar area. Patient was seen in the [facility] ED yesterday after she fell. Patient apparently took a fall on Sunday at her nursing facility. Patients pain had gotten worse and was evaluated. Patient was treated and discharged. Her daughter spoke with her PCP Dr Hyacinth Meeker and was advised to come back into the ED for assessment. Patient had no LOC on the original fall. She apparently lndded on her buttocks. She has no weakness noted. She has sever pain however and so needs pain control. On her Xray there was presence of L2 compression fracture. She has had a prior compression fracture and at that time had a kyphoplasty    PT Comments    Pt with TLSO and able to initiate very limited ambulation - pt continues ltd by intermittent sharp back pains with knee buckling in standing/ambulating.  Follow Up Recommendations  Supervision/Assistance - 24 hour     Equipment Recommendations  Hospital bed    Recommendations for Other Services       Precautions / Restrictions Precautions Precautions: Fall;Back Precaution Comments: Knees buckled when standing-unwarned. Required Braces or Orthoses: Other Brace/Splint Other Brace/Splint: TLSO  Restrictions Weight Bearing Restrictions: No    Mobility  Bed Mobility Overal bed mobility: Needs Assistance;+2 for physical assistance;+ 2 for safety/equipment Bed Mobility: Rolling;Sidelying to Sit;Sit to Sidelying Rolling: Mod assist Sidelying to sit: Max assist;+2 for physical assistance     Sit to sidelying: Max assist;+2 for physical assistance General bed mobility comments: Cues for sequence, correct log roll technique.  Physical assist required to control trunk as well as LEs  Transfers Overall transfer level: Needs  assistance Equipment used: Rolling walker (2 wheeled) Transfers: Sit to/from Stand Sit to Stand: Mod assist;+2 physical assistance;+2 safety/equipment         General transfer comment: cues for LE management and use of UEs to self assist  Ambulation/Gait Ambulation/Gait assistance: Max assist;+2 physical assistance Ambulation Distance (Feet): 4 Feet Assistive device: Rolling walker (2 wheeled) Gait Pattern/deviations: Step-to pattern;Decreased step length - right;Decreased step length - left;Trunk flexed;Shuffle;Narrow base of support Gait velocity: decr   General Gait Details: Short shuffling stride.  Pt step fwd and back and up side of bed.  2 episodes sharp pain  with buckling at knees.     Stairs            Wheelchair Mobility    Modified Rankin (Stroke Patients Only)       Balance Overall balance assessment: Needs assistance Sitting-balance support: Bilateral upper extremity supported;Feet supported Sitting balance-Leahy Scale: Poor Sitting balance - Comments: max use of the  upper body  for trunk support,      Standing balance-Leahy Scale: Zero                      Cognition Arousal/Alertness: Awake/alert Behavior During Therapy: WFL for tasks assessed/performed Overall Cognitive Status: Within Functional Limits for tasks assessed               Problem Solving: Slow processing General Comments: HOH is a deterrent    Exercises      General Comments        Pertinent Vitals/Pain Pain Assessment: Faces Faces Pain Scale: Hurts worst Pain Location: back pain  Pain Descriptors / Indicators: Sharp;Grimacing Pain  Intervention(s): Monitored during session;Limited activity within patient's tolerance    Home Living                      Prior Function            PT Goals (current goals can now be found in the care plan section) Acute Rehab PT Goals Patient Stated Goal: Try to walk Time For Goal Achievement: 06/17/15 Potential  to Achieve Goals: Fair Progress towards PT goals: Progressing toward goals (slowly)    Frequency  Min 3X/week    PT Plan Current plan remains appropriate    Co-evaluation             End of Session Equipment Utilized During Treatment: Other (comment) (TLSO) Activity Tolerance: Patient limited by pain Patient left: in bed;with call bell/phone within reach;with bed alarm set     Time: 1353-1409 PT Time Calculation (min) (ACUTE ONLY): 16 min  Charges:  $Therapeutic Activity: 8-22 mins                    G Codes:      Dashayla Theissen 06/03/2015, 5:03 PM

## 2015-06-03 NOTE — Progress Notes (Signed)
Spoke with MD, and  [facility]. We are currently waiting on TLSO brace to be delivered in order for pt to be transferred back to facility by [facility]. Daughter is aware of plan and has spoken with MD, CSW and myself. No other CM needs that I am aware of at this time.

## 2015-06-03 NOTE — Progress Notes (Signed)
Patient ID: Timmie FoersterMarian Cummings, female   DOB: March 18, 1918, 81 y.o.   MRN: [id number] Pt's latest CT scan lumbar spine was reviewed by Dr. Loreta AveWagner and pt appears to be candidate for L2 VP/KP. Insurance approved case . Per d/w Dr. Allena KatzPatel pt to be discharged to facility today. We will set pt up for f/u OP consultation with Dr. Corliss Skainseveshwar and procedure at later date. Had pt remained inhouse [facility]

## 2015-06-03 NOTE — Discharge Instructions (Signed)
° °  It is important that you read following instructions as well as go over your medication list with RN to help you understand your care after this hospitalization.  Discharge Instructions: 1. Can use TLSO brace for activity and wear as needed.  2. D/c to [facility] 3. Follow up with interventional radiology and PCP  Diet recommendation: as tolerated  Activity: The patient is advised to gradually reintroduce usual activities. With physical therapy  Please request your primary care physician to go over all Hospital Tests and Procedure/Radiological results at the follow up,  Please get all Hospital records sent to your PCP by signing hospital release before you go home.    Do not drive, operating heavy machinery, perform activities at heights, swimming or participation in water activities or provide baby sitting services while your are on Pain, Sleep and Anxiety Medications; until you have been seen by Primary Care Physician or a Neurologist and advised to do so again.  Do not take more than prescribed Pain, Sleep and Anxiety Medications.  You were cared for by a hospitalist during your hospital stay. If you have any questions about your discharge medications or the care you received while you were in the hospital after you are discharged, you can call the unit and ask to speak with the hospitalist on call if the hospitalist that took care of you is not available.   Once you are discharged, your primary care physician will handle any further medical issues.  Please note that NO REFILLS for any discharge medications will be authorized once you are discharged, as it is imperative that you return to your primary care physician (or establish a relationship with a primary care physician if you do not have one) for your aftercare needs so that they can reassess your need for medications and monitor your lab values.  You Must read complete instructions/literature along with all the possible adverse  reactions/side effects for all the Medicines you take and that have been prescribed to you. Take any new Medicines after you have completely understood and accept all the possible adverse reactions/side effects.  Wear Seat belts while driving.

## 2015-06-03 NOTE — Progress Notes (Signed)
Spoke with Daughter, Alyssa Cummings to update her on the plan for this 81 yo pt who is to be seen by PT today and then [facility] [place] [facility] 24/7 CNA care. Pam verbalized her concerns that pt is not allowed to remain in the hospital for pain management. CM explained Care plan described by Allena KatzPatel, MD use of TLSO Brace, Toradol, PT eval, and [place]. Can be followed as outpt by Neuro and Interventional Radiology. Although daughter displeased she does understand, especially since Mother beginning to be awake at night and asleep during the day. CM explained this is usual when older people are out of their usual environment.  CS[facility].

## 2015-06-03 NOTE — Progress Notes (Addendum)
Safety sitter not available at this time-had been calm and cooperative when sitter at bedside.Now awake,agitated,cont to climb oob kicking,swinging arms at staff trying to hit & scratch  staff states she is in a hotel being murdered Unable to take any vital signs .Pa called for orders.Ativan .5 mg iv x 1 given soft restraint orders if needed  Doren CustardBeverly, Shmiel Morton D

## 2015-06-03 NOTE — Progress Notes (Signed)
Patient ID: Timmie FoersterMarian Petruska, female   DOB: [birth year], 81 y.o.   MRN: [id number] Call by nursing for combative patient. Verbal order given for 0.5mg  Ativan. Patient tolerated well and rested. Later this am she became more combative. Restraint order given for soft wrist, due to scratching and pulling lines this am.  IV pulled out by patient. Restraints placed appropriately by nursing. Patient now resting safely. Medicine to further evaluate this am. Family to be updated. Patient safe and sitter to be replaced today.  Nursing to continue to monitor.

## 2015-06-08 ENCOUNTER — Other Ambulatory Visit (HOSPITAL_COMMUNITY): Payer: Self-pay | Admitting: Interventional Radiology

## 2015-06-15 ENCOUNTER — Other Ambulatory Visit: Payer: Self-pay | Admitting: Radiology

## 2015-06-15 ENCOUNTER — Other Ambulatory Visit (HOSPITAL_COMMUNITY): Payer: Self-pay | Admitting: Interventional Radiology

## 2015-06-15 DIAGNOSIS — M549 Dorsalgia, unspecified: Secondary | ICD-10-CM

## 2015-06-15 DIAGNOSIS — IMO0002 Reserved for concepts with insufficient information to code with codable children: Secondary | ICD-10-CM

## 2015-06-16 ENCOUNTER — Other Ambulatory Visit: Payer: Self-pay | Admitting: Radiology

## 2015-06-17 ENCOUNTER — Ambulatory Visit (HOSPITAL_COMMUNITY)
Admission: RE | Admit: 2015-06-17 | Discharge: 2015-06-17 | Disposition: A | Discharge status: Home / Self Care | Payer: Medicare Other | Source: Ambulatory Visit | Attending: Interventional Radiology | Admitting: Interventional Radiology

## 2015-06-17 ENCOUNTER — Encounter (HOSPITAL_COMMUNITY): Payer: Self-pay

## 2015-06-17 ENCOUNTER — Other Ambulatory Visit (HOSPITAL_COMMUNITY): Payer: Self-pay | Admitting: Interventional Radiology

## 2015-06-17 ENCOUNTER — Other Ambulatory Visit (HOSPITAL_COMMUNITY): Payer: Self-pay | Admitting: Radiology

## 2015-06-17 DIAGNOSIS — M4806 Spinal stenosis, lumbar region: Secondary | ICD-10-CM | POA: Insufficient documentation

## 2015-06-17 DIAGNOSIS — H3530 Unspecified macular degeneration: Secondary | ICD-10-CM | POA: Insufficient documentation

## 2015-06-17 DIAGNOSIS — M1990 Unspecified osteoarthritis, unspecified site: Secondary | ICD-10-CM | POA: Insufficient documentation

## 2015-06-17 DIAGNOSIS — S32029A Unspecified fracture of second lumbar vertebra, initial encounter for closed fracture: Secondary | ICD-10-CM | POA: Insufficient documentation

## 2015-06-17 DIAGNOSIS — M810 Age-related osteoporosis without current pathological fracture: Secondary | ICD-10-CM | POA: Diagnosis not present

## 2015-06-17 DIAGNOSIS — Z8249 Family history of ischemic heart disease and other diseases of the circulatory system: Secondary | ICD-10-CM | POA: Diagnosis not present

## 2015-06-17 DIAGNOSIS — IMO0002 Reserved for concepts with insufficient information to code with codable children: Secondary | ICD-10-CM

## 2015-06-17 DIAGNOSIS — W19XXXA Unspecified fall, initial encounter: Secondary | ICD-10-CM | POA: Diagnosis not present

## 2015-06-17 DIAGNOSIS — E7800 Pure hypercholesterolemia, unspecified: Secondary | ICD-10-CM | POA: Insufficient documentation

## 2015-06-17 DIAGNOSIS — I10 Essential (primary) hypertension: Secondary | ICD-10-CM | POA: Diagnosis not present

## 2015-06-17 DIAGNOSIS — K219 Gastro-esophageal reflux disease without esophagitis: Secondary | ICD-10-CM | POA: Diagnosis not present

## 2015-06-17 DIAGNOSIS — M549 Dorsalgia, unspecified: Secondary | ICD-10-CM

## 2015-06-17 DIAGNOSIS — Z7982 Long term (current) use of aspirin: Secondary | ICD-10-CM | POA: Diagnosis not present

## 2015-06-17 DIAGNOSIS — M479 Spondylosis, unspecified: Secondary | ICD-10-CM | POA: Insufficient documentation

## 2015-06-17 DIAGNOSIS — Z9181 History of falling: Secondary | ICD-10-CM | POA: Diagnosis not present

## 2015-06-17 DIAGNOSIS — I350 Nonrheumatic aortic (valve) stenosis: Secondary | ICD-10-CM | POA: Diagnosis not present

## 2015-06-17 DIAGNOSIS — D72829 Elevated white blood cell count, unspecified: Secondary | ICD-10-CM | POA: Diagnosis not present

## 2015-06-17 LAB — URINALYSIS, ROUTINE W REFLEX MICROSCOPIC
Bilirubin Urine: NEGATIVE
Glucose, UA: NEGATIVE mg/dL
Hgb urine dipstick: NEGATIVE
KETONES UR: NEGATIVE mg/dL
LEUKOCYTES UA: NEGATIVE
NITRITE: NEGATIVE
PROTEIN: NEGATIVE mg/dL
Specific Gravity, Urine: 1.011 (ref 1.005–1.030)
pH: 7 (ref 5.0–8.0)

## 2015-06-17 LAB — CBC WITH DIFFERENTIAL/PLATELET
BASOS PCT: 0 %
Basophils Absolute: 0 10*3/uL (ref 0.0–0.1)
EOS ABS: 0.3 10*3/uL (ref 0.0–0.7)
EOS PCT: 3 %
HCT: 39.5 % (ref 36.0–46.0)
Hemoglobin: 12.8 g/dL (ref 12.0–15.0)
LYMPHS ABS: 1 10*3/uL (ref 0.7–4.0)
Lymphocytes Relative: 9 %
MCH: 30 pg (ref 26.0–34.0)
MCHC: 32.4 g/dL (ref 30.0–36.0)
MCV: 92.5 fL (ref 78.0–100.0)
MONO ABS: 0.7 10*3/uL (ref 0.1–1.0)
MONOS PCT: 6 %
NEUTROS PCT: 82 %
Neutro Abs: 9.5 10*3/uL — ABNORMAL HIGH (ref 1.7–7.7)
PLATELETS: 295 10*3/uL (ref 150–400)
RBC: 4.27 MIL/uL (ref 3.87–5.11)
RDW: 13.8 % (ref 11.5–15.5)
WBC: 11.6 10*3/uL — ABNORMAL HIGH (ref 4.0–10.5)

## 2015-06-17 LAB — PROTIME-INR
INR: 1.1 (ref 0.00–1.49)
PROTHROMBIN TIME: 14.4 s (ref 11.6–15.2)

## 2015-06-17 MED ORDER — LIDOCAINE HCL (PF) 1 % IJ SOLN
INTRAMUSCULAR | Status: AC
Start: 2015-06-17 — End: 2015-06-17
  Filled 2015-06-17: qty 30

## 2015-06-17 MED ORDER — MIDAZOLAM HCL 2 MG/2ML IJ SOLN
INTRAMUSCULAR | Status: AC
  Filled 2015-06-17: qty 4

## 2015-06-17 MED ORDER — SODIUM CHLORIDE 0.9 % IV SOLN
INTRAVENOUS | Status: DC
  Administered 2015-06-17: 08:00:00 via INTRAVENOUS

## 2015-06-17 MED ORDER — FENTANYL CITRATE (PF) 100 MCG/2ML IJ SOLN
INTRAMUSCULAR | Status: DC
Start: 2015-06-17 — End: 2015-06-17
  Filled 2015-06-17: qty 2

## 2015-06-17 MED ORDER — CEFAZOLIN SODIUM-DEXTROSE 2-3 GM-% IV SOLR
INTRAVENOUS | Status: AC
Start: 2015-06-17 — End: 2015-06-17
  Filled 2015-06-17: qty 50

## 2015-06-17 MED ORDER — CEFAZOLIN SODIUM-DEXTROSE 2-3 GM-% IV SOLR: 2.0000 g | Freq: Once | INTRAVENOUS | Status: DC

## 2015-06-17 NOTE — Discharge Instructions (Signed)
Follow up with  doctor/ procedure cancelled/ per dr Loreta Avewagner

## 2015-06-17 NOTE — Progress Notes (Signed)
Interventional Radiology Progress Note  Patient has a leukocytosis, with a shift.  Although she is not complaining of fevers, rigors, chills, I have discussed risk reduction with the family, and they agree that delaying is appropriate.    Recommendations:  - Urinalysis - CXR - We will delay vertebral augmentation until next week, first available, with recheck of CBC at that time.   Signed,  Yvone NeuJaime S. Loreta AveWagner, DO

## 2015-06-17 NOTE — Sedation Documentation (Signed)
Pt back in bed, transferred to xray for CXR.

## 2015-06-17 NOTE — H&P (Signed)
Chief Complaint: Patient was seen in consultation today for L2 compression fracture at the request of [name],[name]  History of Present Illness: Alyssa Cummings is a 81 y.o. female s/p fall 05/29/15, now with lower back pain uncontrolled with pain medication. She is accompanied by her daughter today and caregiver who also provide history. The patient exhibits most pain with sitting up and ambulating which is new from her baseline. She has a history of osteoporosis and history of Lumbar level 1 compression fracture s/p vertebroplasty 2014 with relief. She presented to ED with back pain post fall and had a CT on 06/01/15 with new subacute L2 compression fracture. IR was consulted and the patient was scheduled today for image guided L2 vertebroplasty/kyphoplasty. She denies any new loss of bowel or bladder function. She denies any chest pain, shortness of breath or palpitations. She denies any active signs of bleeding or excessive bruising. She denies any recent fever or chills. The patient denies any history of sleep apnea or chronic oxygen use. She has no known complications to sedation.    Past Medical History  Diagnosis Date  . Hypertension   . High cholesterol   . Back pain   . Macular degeneration   . GERD (gastroesophageal reflux disease)   . OA (osteoarthritis)   . Osteoporosis   . DJD (degenerative joint disease) of cervical spine   . Moderate aortic stenosis     a. 07/2013 Echo: EF 60-65%, Gr 1 DD, mild AI, mod AS, tirv MR, mildly dil LA.  . Falls     a. uses walker, PT 3x/wk. Last fall 03/2014.    Past Surgical History  Procedure Laterality Date  . Cholecystectomy    . Back surgery    . Breast surgery      Allergies: Codeine  Medications: Prior to Admission medications   Medication Sig Start Date End Date Taking? Authorizing Provider  acetaminophen (TYLENOL) 650 MG CR tablet Take 650 mg by mouth every 6 (six) hours as needed for pain.   Yes Historical Provider, MD    polyethylene glycol (MIRALAX / GLYCOLAX) packet Take 17 g by mouth daily as needed for mild constipation. 06/03/15  Yes Rolly Salter, MD  alendronate (FOSAMAX) 70 MG tablet Take 70 mg by mouth every 7 (seven) days. Take with a full glass of water on an empty stomach. Take on Saturdays    Historical Provider, MD  amitriptyline (ELAVIL) 50 MG tablet Take 50 mg by mouth at bedtime.    Historical Provider, MD  amLODipine (NORVASC) 5 MG tablet Take 5 mg by mouth every morning.     Historical Provider, MD  aspirin EC 81 MG EC tablet Take 1 tablet (81 mg total) by mouth daily. 04/16/14   Dwana Melena, PA-C  atorvastatin (LIPITOR) 40 MG tablet Take 40 mg by mouth at bedtime.     Historical Provider, MD  carvedilol (COREG) 3.125 MG tablet Take 1 tablet (3.125 mg total) by mouth 2 (two) times daily with a meal. 10/28/14   Mihai Croitoru, MD  docusate sodium (COLACE) 100 MG capsule Take 100 mg by mouth at bedtime.     Historical Provider, MD  lansoprazole (PREVACID) 30 MG capsule Take 30 mg by mouth every other day.     Historical Provider, MD  latanoprost (XALATAN) 0.005 % ophthalmic solution Place 1 drop into both eyes at bedtime.    Historical Provider, MD  lidocaine (LIDODERM) 5 % Place 1 patch onto the skin daily. Apply  on back, Remove & Discard patch within 12 hours or as directed by MD 06/03/15   Rolly SalterPranav M Patel, MD  lisinopril (PRINIVIL,ZESTRIL) 40 MG tablet Take 40 mg by mouth every morning.     Historical Provider, MD  methocarbamol (ROBAXIN) 500 MG tablet Take 1 tablet (500 mg total) by mouth every 6 (six) hours as needed for muscle spasms. 06/03/15   Rolly SalterPranav M Patel, MD  naproxen (NAPROSYN) 500 MG tablet Take 1 tablet (500 mg total) by mouth 3 (three) times daily with meals. 06/03/15   Rolly SalterPranav M Patel, MD  tolterodine (DETROL LA) 2 MG 24 hr capsule Take 2 mg by mouth every morning.    Historical Provider, MD  traMADol (ULTRAM) 50 MG tablet Take 0.5 tablets (25 mg total) by mouth every 6 (six) hours  as needed for severe pain. 06/03/15   Rolly SalterPranav M Patel, MD     Family History  Problem Relation Age of Onset  . Heart attack Mother   . Heart attack Father     Social History   Social History  . Marital Status: Widowed    Spouse Name: [name]  . Number of Children: [name]  . Years of Education: [name]   Social History Main Topics  . Smoking status: Never Smoker   . Smokeless tobacco: None  . Alcohol Use: No  . Drug Use: No  . Sexual Activity: Not Asked   Other Topics Concern  . None   Social History Narrative   Review of Systems: A 12 point ROS discussed and pertinent positives are indicated in the HPI above.  All other systems are negative.  Review of Systems  Vital Signs: T: 97.5 F, HR: 81 bpm, BP: 135/65 mmHg, O2: 98% RA  Physical Exam  Constitutional: She is oriented to person, place, and time. No distress.  HENT:  Head: Normocephalic and atraumatic.  Cardiovascular: Normal rate and regular rhythm.  Exam reveals no gallop and no friction rub.   No murmur heard. Pulmonary/Chest: Effort normal and breath sounds normal. No respiratory distress. She has no wheezes. She has no rales.  Abdominal: Soft. Bowel sounds are normal. She exhibits no distension. There is no tenderness.  Neurological: She is alert and oriented to person, place, and time.  Skin: Skin is warm and dry. She is not diaphoretic.    Mallampati Score:  MD Evaluation Airway: WNL Heart: WNL Abdomen: WNL Chest/ Lungs: WNL ASA  Classification: 3 Mallampati/Airway Score: Two  Imaging: Dg Thoracic Spine W/swimmers  05/31/2015  CLINICAL DATA:  Status post fall in living room, with upper back pain. Initial encounter. EXAM: THORACIC SPINE - 3 VIEWS COMPARISON:  Chest radiograph performed 04/21/2015 FINDINGS: There is no evidence of fracture or subluxation. Vertebral bodies demonstrate normal height and alignment. Intervertebral disc spaces are preserved. The patient is status post vertebroplasty at the lower  thoracic spine. Large anterior osteophytes are noted along the thoracic spine. The visualized portions of both lungs are clear. The mediastinum is unremarkable in appearance. IMPRESSION: 1. No evidence of fracture or subluxation along the thoracic spine. 2. Mild diffuse degenerative change along the thoracic spine. Electronically Signed   By: Roanna RaiderJeffery  Chang M.D.   On: 05/31/2015 23:15   Dg Lumbar Spine Complete  05/31/2015  CLINICAL DATA:  Larey SeatFell last night in the living room.  Lumbar pain. EXAM: LUMBAR SPINE - COMPLETE 4+ VIEW COMPARISON:  CT lumbar spine 10/17/2012. Lumbar spine radiographs 10/08/2012. FINDINGS: Diffuse degenerative change throughout the lumbar spine with narrowed lumbar interspaces and  associated endplate hypertrophic changes. Multilevel degenerative disc disease. Diffuse bone demineralization likely due to osteoporosis. Anterior compression of the L1 vertebra. Interval kyphoplasty changes at this level. Ballooning of the interspace at L1-2 suggesting developing compression of the superior endplate of L2, likely osteoporotic. No acute fracture is demonstrated. Normal alignment of the lumbar spine. Visualize sacral struts appear intact. IMPRESSION: Diffuse degenerative change throughout the lumbar spine. Demineralization with vertebral compression deformities consistent with osteoporosis. Compression of superior endplate of L2 since previous study appears represent chronic osteoporotic change. No acute displaced fractures identified. Electronically Signed   By: Burman Nieves M.D.   On: 05/31/2015 23:15   Ct Head Wo Contrast  06/02/2015  CLINICAL DATA:  Reported falls.  Increased confusion. EXAM: CT HEAD WITHOUT CONTRAST CT CERVICAL SPINE WITHOUT CONTRAST TECHNIQUE: Multidetector CT imaging of the head and cervical spine was performed following the standard protocol without intravenous contrast. Multiplanar CT image reconstructions of the cervical spine were also generated. COMPARISON:   Brain MR of 12/09/2014. Head and C-spine CT of 05/31/2014. FINDINGS: CT HEAD FINDINGS Sinuses/Soft tissues: Minimal mucosal thickening of left ethmoid air cells. Minimal motion degradation. No significant soft tissue swelling. No skull fracture. Clear mastoid air cells. Intracranial: Marked low density in the periventricular white matter likely related to small vessel disease. Expected cerebral volume loss for age. Ventriculomegaly which is felt to be secondary. No mass lesion, hemorrhage, hydrocephalus, acute infarct, intra-axial, or extra-axial fluid collection. CT CERVICAL SPINE FINDINGS Spinal visualization through the bottom of T1. Prevertebral soft tissues are within normal limits. Re- demonstration of a right sided thyroid mass on the order of 3.8 cm. No apical pneumothorax. Mild motion degradation at the level of C1-2. Spondylosis, including degenerate disc disease at C4-5 and disc osteophyte complex. Facet arthropathy at multiple levels. IMPRESSION: 1.  No acute intracranial abnormality. 2. Marked small vessel ischemic change. 3. Cervical spondylosis, without acute fracture or subluxation. 4. Mild motion degradation involving the head and cervical spine (especially at C1-2). Electronically Signed   By: Jeronimo Greaves M.D.   On: 06/02/2015 19:05   Ct Cervical Spine Wo Contrast  06/02/2015  CLINICAL DATA:  Reported falls.  Increased confusion. EXAM: CT HEAD WITHOUT CONTRAST CT CERVICAL SPINE WITHOUT CONTRAST TECHNIQUE: Multidetector CT imaging of the head and cervical spine was performed following the standard protocol without intravenous contrast. Multiplanar CT image reconstructions of the cervical spine were also generated. COMPARISON:  Brain MR of 12/09/2014. Head and C-spine CT of 05/31/2014. FINDINGS: CT HEAD FINDINGS Sinuses/Soft tissues: Minimal mucosal thickening of left ethmoid air cells. Minimal motion degradation. No significant soft tissue swelling. No skull fracture. Clear mastoid air cells.  Intracranial: Marked low density in the periventricular white matter likely related to small vessel disease. Expected cerebral volume loss for age. Ventriculomegaly which is felt to be secondary. No mass lesion, hemorrhage, hydrocephalus, acute infarct, intra-axial, or extra-axial fluid collection. CT CERVICAL SPINE FINDINGS Spinal visualization through the bottom of T1. Prevertebral soft tissues are within normal limits. Re- demonstration of a right sided thyroid mass on the order of 3.8 cm. No apical pneumothorax. Mild motion degradation at the level of C1-2. Spondylosis, including degenerate disc disease at C4-5 and disc osteophyte complex. Facet arthropathy at multiple levels. IMPRESSION: 1.  No acute intracranial abnormality. 2. Marked small vessel ischemic change. 3. Cervical spondylosis, without acute fracture or subluxation. 4. Mild motion degradation involving the head and cervical spine (especially at C1-2). Electronically Signed   By: Jeronimo Greaves M.D.   On:  06/02/2015 19:05   Ct Lumbar Spine Wo Contrast  06/01/2015  CLINICAL DATA:  Fall on Sunday. Worsening lower back pain. Prior vertebroplasty. EXAM: CT LUMBAR SPINE WITHOUT CONTRAST TECHNIQUE: Multidetector CT imaging of the lumbar spine was performed without intravenous contrast administration. Multiplanar CT image reconstructions were also generated. COMPARISON:  Plain films of 1 day prior and CT of 10/17/2012 FINDINGS: Soft tissues: Abdominal aortic atherosclerosis. Extensive colonic diverticulosis. No paravertebral hematoma. Bones: Osteopenia. Bone island in the left iliac. Multilevel lumbar facet arthropathy. Prior vertebral augmentation at the L1 vertebral body. An underlying moderate compression deformity with ventral canal encroachment is grossly similar to 10/17/2012 otherwise. L2 superior endplate compression deformity is subacute. Linear fracture lines identified including on image 31/ series 8. Mild (20%) vertebral body height loss with  sclerosis of the superior aspect of the vertebral body. Minimal ventral canal encroachment including on image 29/series 8. No extension into the posterior elements Remainder vertebral body height is maintained. Grade 1 L3-4 anterolisthesis is unchanged. mild convex left lumbar spine curvature. Multifactorial central canal stenosis at L3-4 and L4-5. Prior laminectomies at L3-4. IMPRESSION: 1. Subacute superior endplate compression deformity at L2. Minimal canal encroachment with approximately 20% vertebral body height loss. 2. Chronic L1 compression deformity, status post vertebral augmentation. 3. Spondylosis with chronic L3-4 grade 1 anterolisthesis. Electronically Signed   By: Jeronimo Greaves M.D.   On: 06/01/2015 20:58    Labs:  CBC:  Recent Labs  06/01/15 2056 06/02/15 0430 06/03/15 0756 06/17/15 0815  WBC 10.4 9.4 9.6 11.6*  HGB 13.1 13.5 12.2 12.8  HCT 40.0 41.2 37.8 39.5  PLT 157 180 171 295    COAGS:  Recent Labs  06/02/15 0430 06/03/15 0756 06/17/15 0815  INR 1.11 1.17 1.10    BMP:  Recent Labs  12/09/14 1841 12/09/14 1906 06/01/15 2035 06/02/15 0430 06/03/15 0756  NA 141 142 140 140 142  K 4.0 4.0 4.0 3.7 4.0  CL 105 104 105 105 108  CO2 28  --   --  25 24  GLUCOSE 112* 108* 144* 119* 126*  BUN CALCIUM 9.4  --   --  9.2 8.9  CREATININE 0.74 0.90 0.60 0.67 0.71  GFRNONAA >60  --   --  >60 >60  GFRAA >60  --   --  >60 >60    LIVER FUNCTION TESTS:  Recent Labs  12/09/14 1841 06/02/15 0430 06/03/15 0756  BILITOT 0.4 1.1 0.8  AST 18 49* 28  ALT 16 46 37  ALKPHOS 91 96 92  PROT 6.9 7.1 6.5  ALBUMIN 3.8 3.9 3.3*    Assessment and Plan: S/p fall 05/29/15 with lower back pain uncontrolled with pain medication Osteoporosis  History of Lumbar level 1 compression fracture s/p vertebroplasty 2014 with relief CT 06/01/15 with new subacute L2 compression fracture Scheduled today for image guided L2 vertebroplasty/kyphoplasty with  sedation The patient has been NPO, no blood thinners taken, labs and vitals have been reviewed. Risks and Benefits discussed with the patient including, but not limited to education regarding the natural healing process of compression fractures without intervention, bleeding, infection, cement migration which may cause spinal cord damage, paralysis, pulmonary embolism or even death. All questions were answered, patient and daughter are agreeable to proceed. Consent signed and in chart.    Thank you for this interesting consult.  I greatly enjoyed meeting Ziva Nunziata and look forward to participating in their care.  A copy of this report was  sent to the requesting provider on this date.  SignedBerneta Levins 06/17/2015, 8:40 AM   I spent a total of 15 Minutes in face to face in clinical consultation, greater than 50% of which was counseling/coordinating care for compression fracture.

## 2015-06-17 NOTE — Sedation Documentation (Signed)
Abnormal lab results were reviewed during timeout and procedure was then canceled.

## 2015-06-18 LAB — URINE CULTURE

## 2015-06-22 ENCOUNTER — Other Ambulatory Visit: Payer: Self-pay | Admitting: Radiology

## 2015-06-22 ENCOUNTER — Inpatient Hospital Stay (HOSPITAL_COMMUNITY): Admission: RE | Admit: 2015-06-22 | Payer: Self-pay | Source: Ambulatory Visit

## 2015-06-22 DIAGNOSIS — Z8669 Personal history of other diseases of the nervous system and sense organs: Secondary | ICD-10-CM | POA: Diagnosis not present

## 2015-06-22 DIAGNOSIS — Z9889 Other specified postprocedural states: Secondary | ICD-10-CM | POA: Diagnosis not present

## 2015-06-22 DIAGNOSIS — Z791 Long term (current) use of non-steroidal anti-inflammatories (NSAID): Secondary | ICD-10-CM | POA: Diagnosis not present

## 2015-06-22 DIAGNOSIS — I4891 Unspecified atrial fibrillation: Secondary | ICD-10-CM | POA: Diagnosis not present

## 2015-06-22 DIAGNOSIS — Z79899 Other long term (current) drug therapy: Secondary | ICD-10-CM | POA: Diagnosis not present

## 2015-06-22 DIAGNOSIS — M1990 Unspecified osteoarthritis, unspecified site: Secondary | ICD-10-CM | POA: Diagnosis not present

## 2015-06-22 DIAGNOSIS — Z7982 Long term (current) use of aspirin: Secondary | ICD-10-CM | POA: Diagnosis not present

## 2015-06-22 DIAGNOSIS — E7800 Pure hypercholesterolemia, unspecified: Secondary | ICD-10-CM | POA: Diagnosis not present

## 2015-06-22 DIAGNOSIS — I499 Cardiac arrhythmia, unspecified: Secondary | ICD-10-CM | POA: Diagnosis present

## 2015-06-22 DIAGNOSIS — I10 Essential (primary) hypertension: Secondary | ICD-10-CM | POA: Diagnosis not present

## 2015-06-22 DIAGNOSIS — I480 Paroxysmal atrial fibrillation: Secondary | ICD-10-CM | POA: Diagnosis not present

## 2015-06-22 DIAGNOSIS — K219 Gastro-esophageal reflux disease without esophagitis: Secondary | ICD-10-CM | POA: Diagnosis not present

## 2015-06-22 DIAGNOSIS — M810 Age-related osteoporosis without current pathological fracture: Secondary | ICD-10-CM | POA: Diagnosis not present

## 2015-06-22 DIAGNOSIS — R110 Nausea: Secondary | ICD-10-CM | POA: Diagnosis not present

## 2015-06-22 LAB — CBC
HEMATOCRIT: 40.1 % (ref 36.0–46.0)
HEMOGLOBIN: 12.9 g/dL (ref 12.0–15.0)
MCH: 29.8 pg (ref 26.0–34.0)
MCHC: 32.2 g/dL (ref 30.0–36.0)
MCV: 92.6 fL (ref 78.0–100.0)
Platelets: 263 10*3/uL (ref 150–400)
RBC: 4.33 MIL/uL (ref 3.87–5.11)
RDW: 13.6 % (ref 11.5–15.5)
WBC: 8.9 10*3/uL (ref 4.0–10.5)

## 2015-06-22 LAB — BASIC METABOLIC PANEL
ANION GAP: 7 (ref 5–15)
BUN: 14 mg/dL (ref 6–20)
CO2: 28 mmol/L (ref 22–32)
Calcium: 9.2 mg/dL (ref 8.9–10.3)
Chloride: 105 mmol/L (ref 101–111)
Creatinine, Ser: 0.56 mg/dL (ref 0.44–1.00)
GFR calc Af Amer: >60 mL/min (ref >60)
GLUCOSE: 103 mg/dL — AB (ref 65–99)
POTASSIUM: 4.1 mmol/L (ref 3.5–5.1)
Sodium: 140 mmol/L (ref 135–145)

## 2015-06-22 LAB — APTT: APTT: 31 s (ref 24–37)

## 2015-06-22 LAB — PROTIME-INR
INR: 1.11 (ref 0.00–1.49)
Prothrombin Time: 14.5 seconds (ref 11.6–15.2)

## 2015-06-23 ENCOUNTER — Other Ambulatory Visit: Payer: Self-pay

## 2015-06-23 ENCOUNTER — Ambulatory Visit (HOSPITAL_COMMUNITY)
Admission: RE | Admit: 2015-06-23 | Discharge: 2015-06-23 | Disposition: A | Discharge status: Home / Self Care | Payer: Medicare Other | Source: Ambulatory Visit | Attending: Interventional Radiology | Admitting: Interventional Radiology

## 2015-06-23 ENCOUNTER — Encounter (HOSPITAL_COMMUNITY): Payer: Self-pay

## 2015-06-23 ENCOUNTER — Emergency Department (HOSPITAL_COMMUNITY)
Admission: EM | Admit: 2015-06-23 | Discharge: 2015-06-23 | Disposition: A | Discharge status: Home / Self Care | Payer: Medicare Other | Attending: Emergency Medicine | Admitting: Emergency Medicine

## 2015-06-23 ENCOUNTER — Emergency Department (HOSPITAL_COMMUNITY): Payer: Medicare Other

## 2015-06-23 ENCOUNTER — Telehealth: Payer: Self-pay | Admitting: Cardiovascular Disease

## 2015-06-23 DIAGNOSIS — I10 Essential (primary) hypertension: Secondary | ICD-10-CM

## 2015-06-23 DIAGNOSIS — I480 Paroxysmal atrial fibrillation: Secondary | ICD-10-CM | POA: Diagnosis not present

## 2015-06-23 DIAGNOSIS — Z79899 Other long term (current) drug therapy: Secondary | ICD-10-CM | POA: Insufficient documentation

## 2015-06-23 DIAGNOSIS — M4316 Spondylolisthesis, lumbar region: Secondary | ICD-10-CM | POA: Insufficient documentation

## 2015-06-23 DIAGNOSIS — M810 Age-related osteoporosis without current pathological fracture: Secondary | ICD-10-CM

## 2015-06-23 DIAGNOSIS — Y92008 Other place in unspecified non-institutional (private) residence as the place of occurrence of the external cause: Secondary | ICD-10-CM

## 2015-06-23 DIAGNOSIS — R110 Nausea: Secondary | ICD-10-CM | POA: Insufficient documentation

## 2015-06-23 DIAGNOSIS — S32029A Unspecified fracture of second lumbar vertebra, initial encounter for closed fracture: Secondary | ICD-10-CM | POA: Insufficient documentation

## 2015-06-23 DIAGNOSIS — W19XXXA Unspecified fall, initial encounter: Secondary | ICD-10-CM | POA: Insufficient documentation

## 2015-06-23 DIAGNOSIS — K219 Gastro-esophageal reflux disease without esophagitis: Secondary | ICD-10-CM

## 2015-06-23 DIAGNOSIS — Z7982 Long term (current) use of aspirin: Secondary | ICD-10-CM

## 2015-06-23 DIAGNOSIS — M2578 Osteophyte, vertebrae: Secondary | ICD-10-CM | POA: Insufficient documentation

## 2015-06-23 DIAGNOSIS — Z9889 Other specified postprocedural states: Secondary | ICD-10-CM | POA: Insufficient documentation

## 2015-06-23 DIAGNOSIS — M479 Spondylosis, unspecified: Secondary | ICD-10-CM | POA: Insufficient documentation

## 2015-06-23 DIAGNOSIS — H3530 Unspecified macular degeneration: Secondary | ICD-10-CM

## 2015-06-23 DIAGNOSIS — E7800 Pure hypercholesterolemia, unspecified: Secondary | ICD-10-CM

## 2015-06-23 DIAGNOSIS — M1990 Unspecified osteoarthritis, unspecified site: Secondary | ICD-10-CM

## 2015-06-23 DIAGNOSIS — I4891 Unspecified atrial fibrillation: Secondary | ICD-10-CM | POA: Diagnosis not present

## 2015-06-23 DIAGNOSIS — Z8249 Family history of ischemic heart disease and other diseases of the circulatory system: Secondary | ICD-10-CM

## 2015-06-23 DIAGNOSIS — Z8669 Personal history of other diseases of the nervous system and sense organs: Secondary | ICD-10-CM | POA: Insufficient documentation

## 2015-06-23 DIAGNOSIS — Z791 Long term (current) use of non-steroidal anti-inflammatories (NSAID): Secondary | ICD-10-CM | POA: Insufficient documentation

## 2015-06-23 DIAGNOSIS — IMO0002 Reserved for concepts with insufficient information to code with codable children: Secondary | ICD-10-CM

## 2015-06-23 LAB — CBC WITH DIFFERENTIAL/PLATELET
Basophils Absolute: 0 10*3/uL (ref 0.0–0.1)
Basophils Relative: 0 %
EOS ABS: 0.1 10*3/uL (ref 0.0–0.7)
EOS PCT: 1 %
HCT: 38.8 % (ref 36.0–46.0)
HEMOGLOBIN: 12.7 g/dL (ref 12.0–15.0)
LYMPHS ABS: 1 10*3/uL (ref 0.7–4.0)
Lymphocytes Relative: 8 %
MCH: 30 pg (ref 26.0–34.0)
MCHC: 32.7 g/dL (ref 30.0–36.0)
MCV: 91.7 fL (ref 78.0–100.0)
MONO ABS: 0.8 10*3/uL (ref 0.1–1.0)
MONOS PCT: 7 %
NEUTROS PCT: 84 %
Neutro Abs: 10.4 10*3/uL — ABNORMAL HIGH (ref 1.7–7.7)
Platelets: 246 10*3/uL (ref 150–400)
RBC: 4.23 MIL/uL (ref 3.87–5.11)
RDW: 13.7 % (ref 11.5–15.5)
WBC: 12.4 10*3/uL — ABNORMAL HIGH (ref 4.0–10.5)

## 2015-06-23 LAB — BASIC METABOLIC PANEL
Anion gap: 11 (ref 5–15)
BUN: 12 mg/dL (ref 6–20)
CHLORIDE: 108 mmol/L (ref 101–111)
CO2: 21 mmol/L — AB (ref 22–32)
CREATININE: 0.6 mg/dL (ref 0.44–1.00)
Calcium: 9.2 mg/dL (ref 8.9–10.3)
GFR calc Af Amer: >60 mL/min (ref >60)
GFR calc non Af Amer: >60 mL/min (ref >60)
Glucose, Bld: 119 mg/dL — ABNORMAL HIGH (ref 65–99)
Potassium: 3.9 mmol/L (ref 3.5–5.1)
SODIUM: 140 mmol/L (ref 135–145)

## 2015-06-23 LAB — PROTIME-INR
INR: 1.11 (ref 0.00–1.49)
Prothrombin Time: 14.5 seconds (ref 11.6–15.2)

## 2015-06-23 LAB — I-STAT TROPONIN, ED: TROPONIN I, POC: 0.06 ng/mL (ref 0.00–0.08)

## 2015-06-23 LAB — LIPID PANEL
CHOL/HDL RATIO: 3.3 ratio
CHOLESTEROL: 177 mg/dL (ref 0–200)
HDL: 54 mg/dL (ref >40)
LDL Cholesterol: 96 mg/dL (ref 0–99)
TRIGLYCERIDES: 137 mg/dL (ref <150)
VLDL: 27 mg/dL (ref 0–40)

## 2015-06-23 MED ORDER — CEFAZOLIN SODIUM-DEXTROSE 2-3 GM-% IV SOLR
2.0000 g | INTRAVENOUS | Status: AC
  Administered 2015-06-23: 2 g via INTRAVENOUS

## 2015-06-23 MED ORDER — IOHEXOL 300 MG/ML  SOLN
50.0000 mL | Freq: Once | INTRAMUSCULAR | Status: AC | PRN
  Administered 2015-06-23: 10 mL via INTRAVENOUS

## 2015-06-23 MED ORDER — FENTANYL CITRATE (PF) 100 MCG/2ML IJ SOLN
INTRAMUSCULAR | Status: AC
  Filled 2015-06-23: qty 2

## 2015-06-23 MED ORDER — MIDAZOLAM HCL 2 MG/2ML IJ SOLN
INTRAMUSCULAR | Status: AC | PRN
  Administered 2015-06-23: 0.5 mg via INTRAVENOUS

## 2015-06-23 MED ORDER — CEFAZOLIN SODIUM-DEXTROSE 2-3 GM-% IV SOLR
INTRAVENOUS | Status: AC
  Filled 2015-06-23: qty 50

## 2015-06-23 MED ORDER — SODIUM CHLORIDE 0.9 % IV SOLN: Freq: Once | INTRAVENOUS | Status: DC

## 2015-06-23 MED ORDER — SODIUM CHLORIDE 0.9 % IV BOLUS (SEPSIS)
500.0000 mL | Freq: Once | INTRAVENOUS | Status: AC
  Administered 2015-06-23: 500 mL via INTRAVENOUS

## 2015-06-23 MED ORDER — FENTANYL CITRATE (PF) 100 MCG/2ML IJ SOLN
INTRAMUSCULAR | Status: AC | PRN
  Administered 2015-06-23 (×4): 25 ug via INTRAVENOUS

## 2015-06-23 MED ORDER — FENTANYL CITRATE (PF) 100 MCG/2ML IJ SOLN: 25.0000 ug | INTRAMUSCULAR | Status: DC | PRN

## 2015-06-23 MED ORDER — MIDAZOLAM HCL 2 MG/2ML IJ SOLN
INTRAMUSCULAR | Status: AC
  Filled 2015-06-23: qty 2

## 2015-06-23 MED ORDER — LISINOPRIL 40 MG PO TABS: 20.0000 mg | ORAL_TABLET | Freq: Every morning | ORAL | Status: DC

## 2015-06-23 MED ORDER — BUPIVACAINE HCL (PF) 0.25 % IJ SOLN
INTRAMUSCULAR | Status: AC
  Filled 2015-06-23: qty 30

## 2015-06-23 MED ORDER — DILTIAZEM HCL 30 MG PO TABS: 30.0000 mg | ORAL_TABLET | Freq: Four times a day (QID) | ORAL | Status: DC

## 2015-06-23 NOTE — Telephone Encounter (Signed)
TCM 06/28/15 @ 230pm w/ Lawson FiscalLori

## 2015-06-23 NOTE — Progress Notes (Signed)
C/O nausea paged Dr Bonnielee HaffHoss

## 2015-06-23 NOTE — ED Notes (Signed)
Cardiology at bedside.

## 2015-06-23 NOTE — Sedation Documentation (Signed)
Patient denies pain and is resting comfortably.  

## 2015-06-23 NOTE — Consult Note (Signed)
Patient ID: Bettey Muraoka MRN: [id number], DOB/AGE: 11/18/1917   Admit date: 06/23/2015   Primary Physician: Neldon Labella, MD Primary Cardiologist: Dr. Royann Shivers  Pt. Profile:  pleasant 82 year old female with past medical history of hypertension, hyperlipidemia and likely severe AS presented with kyphoplasty for compression fx after recent fall, post procedure, it was noted she was in afib with RVR  Problem List  Past Medical History  Diagnosis Date  . Hypertension   . High cholesterol   . Back pain   . Macular degeneration   . GERD (gastroesophageal reflux disease)   . OA (osteoarthritis)   . Osteoporosis   . DJD (degenerative joint disease) of cervical spine   . Moderate aortic stenosis     a. 07/2013 Echo: EF 60-65%, Gr 1 DD, mild AI, mod AS, tirv MR, mildly dil LA.  . Falls     a. uses walker, PT 3x/wk. Last fall 03/2014.    Past Surgical History  Procedure Laterality Date  . Cholecystectomy    . Back surgery    . Breast surgery       Allergies  Allergies  Allergen Reactions  . Codeine Nausea And Vomiting    HPI  The patient is a pleasant 82 year old female with past medical history of hypertension, hyperlipidemia and likely severe AS. She has no prior history of atrial fibrillation. She lives in [place] in the independent living section and walks on daily basis. She continued to participate in all local activities which is extraordinarily given her age. Her last EKG in November 2016 shows she was in sinus rhythm. Her last office visit with Dr. Royann Shivers was on 05/06/2015 at which time he was worried about worsening AS as she began to have exertional dyspnea. She denies any chest pain. Echocardiogram was repeated on 05/19/2015 which showed EF 55-60%, grade 1 diastolic dysfunction, moderate AS with mild AR, mild MR with severe annular calcification. It was felt his probable severe AS, therefore a left and right heart cath was scheduled for 06/24/2015 to further  evaluate the degree of aortic stenosis. Unfortunately, patient had a fall and was admitted to the hospital on 06/01/2015 where x-ray showed the presence of L2 compression fracture. TSH on 12/14 was negative. Hemoglobin A1c 5.6. CT of the head was negative for acute process. CT of lumbar spine showed subacute superior endplate compression deformity at L2. She was supposed to undergo kyphoplasty on 12/29 as outpatient, however she was noted to have leukocytosis with a shift. Despite her not complaining of any fever or chill, there was concern about possible underlying infection. Therefore the procedure was delayed. She underwent the same procedure on 06/23/2015, post procedure, she was noted to be atrial fibrillation with RVR which according to the family is new. Unfortunately, I tried to call the short stay nurse who already left for the day, there was no strip to support normal sinus rhythm prior to the procedure, therefore although per family her atrial fibrillation was only started this morning, there is no definitive proof.  She was sent from short stay to the ED for further evaluation. EKG shows she is in atrial fibrillation with RVR. Troponin was negative. Significant laboratory finding include a white blood cell count of 12.4. Chest x-ray was negative for acute process. Her radiology note, she had successful L2 kyphoplasty.   Home Medications  Prior to Admission medications   Medication Sig Start Date End Date Taking? Authorizing Provider  acetaminophen (TYLENOL) 650 MG CR tablet Take 650 mg by  mouth every 6 (six) hours as needed for pain.    Historical Provider, MD  alendronate (FOSAMAX) 70 MG tablet Take 70 mg by mouth every 7 (seven) days. Take with a full glass of water on an empty stomach. Take on Saturdays    Historical Provider, MD  amitriptyline (ELAVIL) 50 MG tablet Take 50 mg by mouth at bedtime.    Historical Provider, MD  amLODipine (NORVASC) 5 MG tablet Take 5 mg by mouth every morning.      Historical Provider, MD  aspirin EC 81 MG EC tablet Take 1 tablet (81 mg total) by mouth daily. 04/16/14   Dwana Melena, PA-C  atorvastatin (LIPITOR) 40 MG tablet Take 40 mg by mouth at bedtime.     Historical Provider, MD  carvedilol (COREG) 3.125 MG tablet Take 1 tablet (3.125 mg total) by mouth 2 (two) times daily with a meal. 10/28/14   Mihai Croitoru, MD  docusate sodium (COLACE) 100 MG capsule Take 100 mg by mouth at bedtime.     Historical Provider, MD  HYDROcodone-acetaminophen (HYCET) 7.5-325 mg/15 ml solution Take 5 mLs by mouth every 4 (four) hours as needed. 06/01/15   Historical Provider, MD  lansoprazole (PREVACID) 30 MG capsule Take 30 mg by mouth every other day.     Historical Provider, MD  latanoprost (XALATAN) 0.005 % ophthalmic solution Place 1 drop into both eyes at bedtime.    Historical Provider, MD  lidocaine (LIDODERM) 5 % Place 1 patch onto the skin daily. Apply on back, Remove & Discard patch within 12 hours or as directed by MD 06/03/15   Rolly Salter, MD  lisinopril (PRINIVIL,ZESTRIL) 40 MG tablet Take 40 mg by mouth every morning.     Historical Provider, MD  methocarbamol (ROBAXIN) 500 MG tablet Take 1 tablet (500 mg total) by mouth every 6 (six) hours as needed for muscle spasms. 06/03/15   Rolly Salter, MD  naproxen (NAPROSYN) 500 MG tablet Take 1 tablet (500 mg total) by mouth 3 (three) times daily with meals. 06/03/15   Rolly Salter, MD  polyethylene glycol (MIRALAX / Ethelene Hal) packet Take 17 g by mouth daily as needed for mild constipation. 06/03/15   Rolly Salter, MD  tolterodine (DETROL LA) 2 MG 24 hr capsule Take 2 mg by mouth every morning.    Historical Provider, MD  traMADol (ULTRAM) 50 MG tablet Take 0.5 tablets (25 mg total) by mouth every 6 (six) hours as needed for severe pain. 06/03/15   Rolly Salter, MD    Family History  Family History  Problem Relation Age of Onset  . Heart attack Mother   . Heart attack Father     Social  History  Social History   Social History  . Marital Status: Widowed    Spouse Name: [name]  . Number of Children: [name]  . Years of Education: [name]   Occupational History  . Not on file.   Social History Main Topics  . Smoking status: Never Smoker   . Smokeless tobacco: Not on file  . Alcohol Use: No  . Drug Use: No  . Sexual Activity: Not on file   Other Topics Concern  . Not on file   Social History Narrative     Review of Systems General:  No chills, fever, night sweats or weight changes.  Cardiovascular:  No chest pain, dyspnea on exertion, edema, orthopnea, palpitations, paroxysmal nocturnal dyspnea.  Dermatological: No rash, lesions/masses Respiratory: No cough, dyspnea Urologic:  No hematuria, dysuria Abdominal:   No nausea, vomiting, diarrhea, bright red blood per rectum, melena, or hematemesis Neurologic:  No visual changes, wkns, changes in mental status. Back sore.  All other systems reviewed and are otherwise negative except as noted above.  Physical Exam  Blood pressure 109/82, pulse 107, temperature 98.1 F (36.7 C), temperature source Axillary, resp. rate 15, SpO2 98 %.  General: Pleasant, NAD Psych: Normal affect. Neuro: Alert and oriented X 3. Moves all extremities spontaneously. HEENT: Normal  Neck: Supple without bruits or JVD. Lungs:  Resp regular and unlabored, CTA. Heart: tachycardic, irregular. no s3, s4, or murmurs. Abdomen: Soft, non-tender, non-distended, BS + x 4.  Extremities: No clubbing, cyanosis or edema. DP/PT/Radials 2+ and equal bilaterally.  Labs  Troponin (Point of Care Test)  Recent Labs  06/23/15 1428  TROPIPOC 0.06   No results for input(s): CKTOTAL, CKMB, TROPONINI in the last 72 hours. Lab Results  Component Value Date   WBC 12.4* 06/23/2015   HGB 12.7 06/23/2015   HCT 38.8 06/23/2015   MCV 91.7 06/23/2015   PLT 246 06/23/2015     Recent Labs Lab 06/23/15 1421  NA 140  K 3.9  CL 108  CO2 21*  BUN 12   CREATININE 0.60  CALCIUM 9.2  GLUCOSE 119*   Lab Results  Component Value Date   CHOL 177 06/23/2015   HDL 54 06/23/2015   LDLCALC 96 06/23/2015   TRIG 137 06/23/2015   No results found for: DDIMER   Radiology/Studies  Dg Chest 2 View  06/17/2015  CLINICAL DATA:  Leukocytosis EXAM: CHEST  2 VIEW COMPARISON:  05/31/2015 FINDINGS: Densely calcified aortic arch. Heart is borderline in size. No confluent airspace opacities or effusions. Degenerative changes in the thoracic spine and shoulders. IMPRESSION: No active cardiopulmonary disease. Electronically Signed   By: Charlett Nose M.D.   On: 06/17/2015 10:51   Dg Thoracic Spine W/swimmers  05/31/2015  CLINICAL DATA:  Status post fall in living room, with upper back pain. Initial encounter. EXAM: THORACIC SPINE - 3 VIEWS COMPARISON:  Chest radiograph performed 04/21/2015 FINDINGS: There is no evidence of fracture or subluxation. Vertebral bodies demonstrate normal height and alignment. Intervertebral disc spaces are preserved. The patient is status post vertebroplasty at the lower thoracic spine. Large anterior osteophytes are noted along the thoracic spine. The visualized portions of both lungs are clear. The mediastinum is unremarkable in appearance. IMPRESSION: 1. No evidence of fracture or subluxation along the thoracic spine. 2. Mild diffuse degenerative change along the thoracic spine. Electronically Signed   By: Roanna Raider M.D.   On: 05/31/2015 23:15   Dg Lumbar Spine Complete  05/31/2015  CLINICAL DATA:  Larey Seat last night in the living room.  Lumbar pain. EXAM: LUMBAR SPINE - COMPLETE 4+ VIEW COMPARISON:  CT lumbar spine 10/17/2012. Lumbar spine radiographs 10/08/2012. FINDINGS: Diffuse degenerative change throughout the lumbar spine with narrowed lumbar interspaces and associated endplate hypertrophic changes. Multilevel degenerative disc disease. Diffuse bone demineralization likely due to osteoporosis. Anterior compression of the L1  vertebra. Interval kyphoplasty changes at this level. Ballooning of the interspace at L1-2 suggesting developing compression of the superior endplate of L2, likely osteoporotic. No acute fracture is demonstrated. Normal alignment of the lumbar spine. Visualize sacral struts appear intact. IMPRESSION: Diffuse degenerative change throughout the lumbar spine. Demineralization with vertebral compression deformities consistent with osteoporosis. Compression of superior endplate of L2 since previous study appears represent chronic osteoporotic change. No acute displaced fractures identified. Electronically Signed  By: Burman NievesWilliam  Stevens M.D.   On: 05/31/2015 23:15   Ct Head Wo Contrast  06/02/2015  CLINICAL DATA:  Reported falls.  Increased confusion. EXAM: CT HEAD WITHOUT CONTRAST CT CERVICAL SPINE WITHOUT CONTRAST TECHNIQUE: Multidetector CT imaging of the head and cervical spine was performed following the standard protocol without intravenous contrast. Multiplanar CT image reconstructions of the cervical spine were also generated. COMPARISON:  Brain MR of 12/09/2014. Head and C-spine CT of 05/31/2014. FINDINGS: CT HEAD FINDINGS Sinuses/Soft tissues: Minimal mucosal thickening of left ethmoid air cells. Minimal motion degradation. No significant soft tissue swelling. No skull fracture. Clear mastoid air cells. Intracranial: Marked low density in the periventricular white matter likely related to small vessel disease. Expected cerebral volume loss for age. Ventriculomegaly which is felt to be secondary. No mass lesion, hemorrhage, hydrocephalus, acute infarct, intra-axial, or extra-axial fluid collection. CT CERVICAL SPINE FINDINGS Spinal visualization through the bottom of T1. Prevertebral soft tissues are within normal limits. Re- demonstration of a right sided thyroid mass on the order of 3.8 cm. No apical pneumothorax. Mild motion degradation at the level of C1-2. Spondylosis, including degenerate disc disease at  C4-5 and disc osteophyte complex. Facet arthropathy at multiple levels. IMPRESSION: 1.  No acute intracranial abnormality. 2. Marked small vessel ischemic change. 3. Cervical spondylosis, without acute fracture or subluxation. 4. Mild motion degradation involving the head and cervical spine (especially at C1-2). Electronically Signed   By: Jeronimo GreavesKyle  Talbot M.D.   On: 06/02/2015 19:05   Ct Cervical Spine Wo Contrast  06/02/2015  CLINICAL DATA:  Reported falls.  Increased confusion. EXAM: CT HEAD WITHOUT CONTRAST CT CERVICAL SPINE WITHOUT CONTRAST TECHNIQUE: Multidetector CT imaging of the head and cervical spine was performed following the standard protocol without intravenous contrast. Multiplanar CT image reconstructions of the cervical spine were also generated. COMPARISON:  Brain MR of 12/09/2014. Head and C-spine CT of 05/31/2014. FINDINGS: CT HEAD FINDINGS Sinuses/Soft tissues: Minimal mucosal thickening of left ethmoid air cells. Minimal motion degradation. No significant soft tissue swelling. No skull fracture. Clear mastoid air cells. Intracranial: Marked low density in the periventricular white matter likely related to small vessel disease. Expected cerebral volume loss for age. Ventriculomegaly which is felt to be secondary. No mass lesion, hemorrhage, hydrocephalus, acute infarct, intra-axial, or extra-axial fluid collection. CT CERVICAL SPINE FINDINGS Spinal visualization through the bottom of T1. Prevertebral soft tissues are within normal limits. Re- demonstration of a right sided thyroid mass on the order of 3.8 cm. No apical pneumothorax. Mild motion degradation at the level of C1-2. Spondylosis, including degenerate disc disease at C4-5 and disc osteophyte complex. Facet arthropathy at multiple levels. IMPRESSION: 1.  No acute intracranial abnormality. 2. Marked small vessel ischemic change. 3. Cervical spondylosis, without acute fracture or subluxation. 4. Mild motion degradation involving the head  and cervical spine (especially at C1-2). Electronically Signed   By: Jeronimo GreavesKyle  Talbot M.D.   On: 06/02/2015 19:05   Ct Lumbar Spine Wo Contrast  06/01/2015  CLINICAL DATA:  Fall on Sunday. Worsening lower back pain. Prior vertebroplasty. EXAM: CT LUMBAR SPINE WITHOUT CONTRAST TECHNIQUE: Multidetector CT imaging of the lumbar spine was performed without intravenous contrast administration. Multiplanar CT image reconstructions were also generated. COMPARISON:  Plain films of 1 day prior and CT of 10/17/2012 FINDINGS: Soft tissues: Abdominal aortic atherosclerosis. Extensive colonic diverticulosis. No paravertebral hematoma. Bones: Osteopenia. Bone island in the left iliac. Multilevel lumbar facet arthropathy. Prior vertebral augmentation at the L1 vertebral body. An underlying moderate compression  deformity with ventral canal encroachment is grossly similar to 10/17/2012 otherwise. L2 superior endplate compression deformity is subacute. Linear fracture lines identified including on image 31/ series 8. Mild (20%) vertebral body height loss with sclerosis of the superior aspect of the vertebral body. Minimal ventral canal encroachment including on image 29/series 8. No extension into the posterior elements Remainder vertebral body height is maintained. Grade 1 L3-4 anterolisthesis is unchanged. mild convex left lumbar spine curvature. Multifactorial central canal stenosis at L3-4 and L4-5. Prior laminectomies at L3-4. IMPRESSION: 1. Subacute superior endplate compression deformity at L2. Minimal canal encroachment with approximately 20% vertebral body height loss. 2. Chronic L1 compression deformity, status post vertebral augmentation. 3. Spondylosis with chronic L3-4 grade 1 anterolisthesis. Electronically Signed   By: Jeronimo Greaves M.D.   On: 06/01/2015 20:58   Dg Chest Portable 1 View  06/23/2015  CLINICAL DATA:  Atrial fibrillation. EXAM: PORTABLE CHEST 1 VIEW COMPARISON:  06/17/2015 FINDINGS: Heart size and  pulmonary vascularity are normal and the lungs are clear. Calcification in the arch of the aorta. No acute osseous abnormalities. IMPRESSION: No acute disease.  Aortic atherosclerosis. Electronically Signed   By: Francene Boyers M.D.   On: 06/23/2015 14:13   Ir Kypho Vertebral Lumbar Augmentation  06/23/2015  CLINICAL DATA:  L2 compression fracture. Afebrile. No evidence of leukocytosis. Negative blood culture. EXAM: IR KYPHO VERTEBRAL LUMBAR AUGMENTATION FLUOROSCOPY TIME:  6 minutes and 48 seconds MEDICATIONS AND MEDICAL HISTORY: Versed 5 mg, Fentanyl 100 mcg. Additional Medications: . ANESTHESIA/SEDATION: Moderate sedation time: 30 minutes CONTRAST:  None PROCEDURE: The procedure, risks, benefits, and alternatives were explained to the patient. Questions regarding the procedure were encouraged and answered. The patient understands and consents to the procedure. The back was prepped with Betadine in a sterile fashion, and a sterile drape was applied covering the operative field. A sterile gown and sterile gloves were used for the procedure. 1% lidocaine was utilized for local anesthesia. Under fluoroscopic guidance, T10 gauge expressed needle was advanced into the L2 vertebral body at via right pedicle approach. A drill was advanced into the vertebral body. The 15 2 balloon was then advanced into the vertebral body and insufflated to 100 PSI. The balloon was removed and cement was injected utilizing the deposition trocar and cement delivery system. Contrast filled the cavity as well as the vertebral body, crossing the midline, and extending into the superior endplate. The cement trocar and outer expressed needle within removed. FINDINGS: Images document L2 kyphoplasty via right pedicle pressure. Cement fills the vertebral body, crossing the midline, and extending from the inferior to superior endplate. There is no extraosseous cement. COMPLICATIONS: None IMPRESSION: Successful L2 kyphoplasty. Electronically Signed    By: Jolaine Click M.D.   On: 06/23/2015 11:10   Ir Radiologist Eval & Mgmt  06/17/2015  EXAM: ESTABLISHED PATIENT OFFICE VISIT CHIEF COMPLAINT: Back pain HISTORY OF PRESENT ILLNESS: Patient is a 82 year old female who had a fall May 29, 2015 with lower back pain after her fall. She is on high doses of pain medication, which has been unsuccessful with controlling her pain. CT scan performed 06/01/2015 demonstrates L2 compression fracture. She has been previously treated for L1 compression fracture 2014 with excellent relief of symptoms. She denies fevers, rigors, chills. Lab values do demonstrate leukocytosis, of uncertain origin. PHYSICAL EXAMINATION: Focal back pain centered over the L2 region, confirmed with fluoroscopic guidance. ASSESSMENT AND PLAN: Given the patient's leukocytosis, we will investigate with urinalysis and a chest x-ray. I think best practice  will be to delay until we are certain there is no infectious source. I discussed decreasing her risk with the leg until next week with the family and the patient, who are in agreement with the strategy of risk reduction. We will order urinalysis and a chest x-ray today. Hopefully we can treat her within a week. Signed, Yvone Neu. Loreta Ave, DO [facility] Radiology Electronically Signed   By: Gilmer Mor D.O.   On: 06/17/2015 11:38    ECG  afib with RVR  Echocardiogram 05/19/2015  LV EF: 55% -  60%  ------------------------------------------------------------------- Indications:   I35.0 Aortic Stenosis.  ------------------------------------------------------------------- History:  PMH: Acquired from the patient and from the patient&'s chart. Risk factors: Hypertension. Hypercholesterolemia.  ------------------------------------------------------------------- Study Conclusions  - Left ventricle: The cavity size was normal. There was mild concentric hypertrophy. Systolic  function was normal. The estimated ejection fraction was in the range of 55% to 60%. Doppler parameters are consistent with abnormal left ventricular relaxation (grade 1 diastolic dysfunction). - Aortic valve: There was moderate stenosis. There was mild regurgitation. - Mitral valve: Severe annular calcification There was mild regurgitation. - Left atrium: The atrium was moderately dilated. - Atrial septum: Aneurysmal with no obvious PFO but poorly visualized on sub costal images    ASSESSMENT AND PLAN  1. PAF with uncertain duration  - per family it is likely started this morning, however after discussing with radiology and short stay staff, unable to confirm onset of afib this morning. Unfortunately patient has no cardiac awareness.  - CHA2DS2-Vasc score 4 (age, female, HTN)  - Patient converted in the ED, start PO diltiazem to control HR. Will decide systemic anticoagulation later on flex clinic   2. Lumbar fx s/p successful L2 kyphoplasty today  3. Possible severe AS by symptom with only moderate degree AS on echo  - planning for outpatient L&RHC on 1/5, procedure cancelled due to recent compression fracture  - per Dr. Royann Shivers, not a conventional open surgery candidate, TAVR likely will not offer much survival benefit, but likely will offer symptomatic relief  4. Hypertension 5. hyperlipidemia   Signed, Azalee Course, PA-C 06/23/2015, 3:55 PM   History and all data above reviewed.  Patient examined.  I agree with the findings as above.  Patient with asymptomatic paroxysmal atrial fib.  She went into this post back injection today.  she does not feel this.  No chest pain.  No palpitations, presyncope or syncope.  No SOB.   The patient exam reveals COR:RRR, 3/6 systolic murmur,  Lungs: Clear  ,  Abd: Positive bowel sounds, no rebound no guarding, Ext No edema   .  All available labs, radiology testing, previous records reviewed. Agree with documented assessment and plan.  Atrial fib:  Now back in NSR.  Plan is to stop the Norvasc, and reduce the lisinopril to 20 mg daily.  I would suggest Cardizem 30 mg po q 6 hours.  We will then arrange to see her in the office on Monday.  At that point I would suggest Eliquis and stopping ASA (I will hold off the Eliquis today because of the kyphoplasty).  I discussed at length the risk and benefit with the patient and family.  I would then follow up with a 48 Holter to judge rate control before converting to Cardizem CD.  (Of note her family is thinking of not persuing further TAVR work up.)  [facility] [employer]  3:57 PM  06/23/2015

## 2015-06-23 NOTE — Discharge Instructions (Signed)
KYPHOPLASTY/VERTEBROPLASTY DISCHARGE INSTRUCTIONS  Medications: (check all that apply)     Resume all home medications as before procedure.       Resume your (aspirin/Plavix/Coumadin) on                  Continue your pain medications as prescribed as needed.  Over the next 3-5 days, decrease your pain medication as tolerated.  Over the counter medications (i.e. Tylenol, ibuprofen, and aleve) may be substituted once severe/moderate pain symptoms have subsided.   Wound Care: Bandages may be removed the day following your procedure.  You may get your incision wet once bandages are removed.  Bandaids may be used to cover the incisions until scab formation.  Topical ointments are optional.  If you develop a fever greater than 101 degrees, have increased skin redness at the incision sites or pus-like oozing from incisions occurring within 1 week of the procedure, contact radiology at [phone] or [phone].  Ice pack to back for 15-20 minutes 2-3 time per day for first 2-3 days post procedure.  The ice will expedite muscle healing and help with the pain from the incisions.   Activity: Bedrest today with limited activity for 24 hours post procedure.  No driving for 48 hours.  Increase your activity as tolerated after bedrest (with assistance if necessary).  Refrain from any strenuous activity or heavy lifting (greater than 10 lbs.).   Follow up: Contact radiology at [phone] or [phone] if any questions/concerns.  A physician assistant from radiology will contact you in approximately 1 week.  If a biopsy was performed at the time of your procedure, your referring physician should receive the results in usually 2-3 days.         

## 2015-06-23 NOTE — Progress Notes (Signed)
Patient ID: Alyssa FoersterMarian Cummings, female   DOB: December 06, 1917, 82 y.o.   MRN: [id number]   Pt in ED  evaluated by Lisabeth DevoidMeng PA and Dr Oilton LionsHochrien  EKG shows rapid afib HR continues to be 120-130s  Resting comfortably I spoke to pt and family  Plan per Cardiology recommendation Will follow

## 2015-06-23 NOTE — ED Notes (Signed)
Pt here from shortstay post kyphoplasty- after return from procedure pt noted to have rapid heart rate and decreased blood pressure. Pt denies chest pain, only complaint nausea. Pt is alert and oriented X4. Pt noted to be in a-fib RVR on arrival. IV in place.

## 2015-06-23 NOTE — ED Provider Notes (Signed)
CSN: [id number]     Arrival date & time 06/23/15  1313 History   First MD Initiated Contact with Patient 06/23/15 1323     Chief Complaint  Patient presents with  . Atrial Fibrillation     (Consider location/radiation/quality/duration/timing/severity/associated sxs/prior Treatment) HPI Comments: 82 year old female with history of hypertension, aortic stenosis and doses, hyperlipidemia, compression fracture status post kyphoplasty today presents for rapid heart rate. The patient underwent a kyphoplasty that was successful but after the procedure well and the postop area the patient was noted to have a rapid heart rate. She was found to be in atrial fibrillation with rapid ventricular response. The patient reports she feels some mild nausea but has ever since the anesthesia. Denies any chest pain or shortness of breath. Denies any palpitations. No recent fevers or chills.   Past Medical History  Diagnosis Date  . Hypertension   . High cholesterol   . Back pain   . Macular degeneration   . GERD (gastroesophageal reflux disease)   . OA (osteoarthritis)   . Osteoporosis   . DJD (degenerative joint disease) of cervical spine   . Moderate aortic stenosis     a. 07/2013 Echo: EF 60-65%, Gr 1 DD, mild AI, mod AS, tirv MR, mildly dil LA.  . Falls     a. uses walker, PT 3x/wk. Last fall 03/2014.   Past Surgical History  Procedure Laterality Date  . Cholecystectomy    . Back surgery    . Breast surgery     Family History  Problem Relation Age of Onset  . Heart attack Mother   . Heart attack Father    Social History  Substance Use Topics  . Smoking status: Never Smoker   . Smokeless tobacco: None  . Alcohol Use: No   OB History    No data available     Review of Systems  Constitutional: Negative for fever, diaphoresis, appetite change and fatigue.  HENT: Negative for congestion, rhinorrhea and sinus pressure.   Respiratory: Negative for cough, chest tightness and shortness of  breath.   Cardiovascular: Negative for chest pain, palpitations and leg swelling.  Gastrointestinal: Positive for nausea. Negative for vomiting, abdominal pain, diarrhea and constipation.  Genitourinary: Negative for dysuria, urgency and hematuria.  Musculoskeletal: Negative for myalgias and back pain.  Skin: Negative for rash.  Neurological: Negative for dizziness, weakness, light-headedness and headaches.  Hematological: Does not bruise/bleed easily.      Allergies  Codeine  Home Medications   Prior to Admission medications   Medication Sig Start Date End Date Taking? Authorizing Provider  acetaminophen (TYLENOL) 650 MG CR tablet Take 650 mg by mouth every 6 (six) hours as needed for pain.   Yes Historical Provider, MD  alendronate (FOSAMAX) 70 MG tablet Take 70 mg by mouth every 7 (seven) days. Take with a full glass of water on an empty stomach. Take on Saturdays   Yes Historical Provider, MD  amitriptyline (ELAVIL) 50 MG tablet Take 50 mg by mouth at bedtime.   Yes Historical Provider, MD  aspirin EC 81 MG EC tablet Take 1 tablet (81 mg total) by mouth daily. 04/16/14  Yes Dwana MelenaBryan W Hager, PA-C  atorvastatin (LIPITOR) 40 MG tablet Take 40 mg by mouth at bedtime.    Yes Historical Provider, MD  carvedilol (COREG) 3.125 MG tablet Take 1 tablet (3.125 mg total) by mouth 2 (two) times daily with a meal. 10/28/14  Yes Mihai Croitoru, MD  docusate sodium (COLACE) 100 MG capsule  Take 100 mg by mouth at bedtime.    Yes Historical Provider, MD  HYDROcodone-acetaminophen (HYCET) 7.5-325 mg/15 ml solution Take 5 mLs by mouth every 4 (four) hours as needed. 06/01/15  Yes Historical Provider, MD  lansoprazole (PREVACID) 30 MG capsule Take 30 mg by mouth every other day.    Yes Historical Provider, MD  latanoprost (XALATAN) 0.005 % ophthalmic solution Place 1 drop into both eyes at bedtime.   Yes Historical Provider, MD  lidocaine (LIDODERM) 5 % Place 1 patch onto the skin daily. Apply on back,  Remove & Discard patch within 12 hours or as directed by MD 06/03/15  Yes Rolly Salter, MD  methocarbamol (ROBAXIN) 500 MG tablet Take 1 tablet (500 mg total) by mouth every 6 (six) hours as needed for muscle spasms. 06/03/15  Yes Rolly Salter, MD  naproxen (NAPROSYN) 500 MG tablet Take 1 tablet (500 mg total) by mouth 3 (three) times daily with meals. 06/03/15  Yes Rolly Salter, MD  polyethylene glycol (MIRALAX / GLYCOLAX) packet Take 17 g by mouth daily as needed for mild constipation. 06/03/15  Yes Rolly Salter, MD  tolterodine (DETROL LA) 2 MG 24 hr capsule Take 2 mg by mouth every morning.   Yes Historical Provider, MD  traMADol (ULTRAM) 50 MG tablet Take 0.5 tablets (25 mg total) by mouth every 6 (six) hours as needed for severe pain. 06/03/15  Yes Rolly Salter, MD  diltiazem (CARDIZEM) 30 MG tablet Take 1 tablet (30 mg total) by mouth every 6 (six) hours. 06/23/15   Leta Baptist, MD  lisinopril (PRINIVIL,ZESTRIL) 40 MG tablet Take 0.5 tablets (20 mg total) by mouth every morning. 06/23/15   Leta Baptist, MD   BP 115/67 mmHg  Pulse 92  Temp(Src) 98.1 F (36.7 C) (Axillary)  Resp 16  SpO2 97% Physical Exam  Constitutional: She is oriented to person, place, and time. She appears well-developed and well-nourished. No distress.  HENT:  Head: Normocephalic and atraumatic.  Right Ear: External ear normal.  Left Ear: External ear normal.  Nose: Nose normal.  Mouth/Throat: Oropharynx is clear and moist. No oropharyngeal exudate.  Eyes: EOM are normal. Pupils are equal, round, and reactive to light.  Neck: Normal range of motion. Neck supple.  Cardiovascular: Intact distal pulses.  An irregularly irregular rhythm present. Tachycardia present.   Murmur heard.  Systolic murmur is present  Pulmonary/Chest: Effort normal. No respiratory distress. She has no wheezes. She has no rales.  Abdominal: Soft. She exhibits no distension. There is no tenderness.  Musculoskeletal: Normal  range of motion. She exhibits no edema or tenderness.  Neurological: She is alert and oriented to person, place, and time.  Skin: Skin is warm and dry. No rash noted. She is not diaphoretic.  Vitals reviewed.   ED Course  Procedures (including critical care time) Labs Review Labs Reviewed  CBC WITH DIFFERENTIAL/PLATELET - Abnormal; Notable for the following:    WBC 12.4 (*)    Neutro Abs 10.4 (*)    All other components within normal limits  BASIC METABOLIC PANEL - Abnormal; Notable for the following:    CO2 21 (*)    Glucose, Bld 119 (*)    All other components within normal limits  PROTIME-INR  Rosezena Sensor, ED    Imaging Review Dg Chest Portable 1 View  06/23/2015  CLINICAL DATA:  Atrial fibrillation. EXAM: PORTABLE CHEST 1 VIEW COMPARISON:  06/17/2015 FINDINGS: Heart size and pulmonary vascularity are normal and  the lungs are clear. Calcification in the arch of the aorta. No acute osseous abnormalities. IMPRESSION: No acute disease.  Aortic atherosclerosis. Electronically Signed   By: Francene Boyers M.D.   On: 06/23/2015 14:13   Ir Kypho Vertebral Lumbar Augmentation  06/23/2015  CLINICAL DATA:  L2 compression fracture. Afebrile. No evidence of leukocytosis. Negative blood culture. EXAM: IR KYPHO VERTEBRAL LUMBAR AUGMENTATION FLUOROSCOPY TIME:  6 minutes and 48 seconds MEDICATIONS AND MEDICAL HISTORY: Versed 5 mg, Fentanyl 100 mcg. Additional Medications: . ANESTHESIA/SEDATION: Moderate sedation time: 30 minutes CONTRAST:  None PROCEDURE: The procedure, risks, benefits, and alternatives were explained to the patient. Questions regarding the procedure were encouraged and answered. The patient understands and consents to the procedure. The back was prepped with Betadine in a sterile fashion, and a sterile drape was applied covering the operative field. A sterile gown and sterile gloves were used for the procedure. 1% lidocaine was utilized for local anesthesia. Under fluoroscopic  guidance, T10 gauge expressed needle was advanced into the L2 vertebral body at via right pedicle approach. A drill was advanced into the vertebral body. The 15 2 balloon was then advanced into the vertebral body and insufflated to 100 PSI. The balloon was removed and cement was injected utilizing the deposition trocar and cement delivery system. Contrast filled the cavity as well as the vertebral body, crossing the midline, and extending into the superior endplate. The cement trocar and outer expressed needle within removed. FINDINGS: Images document L2 kyphoplasty via right pedicle pressure. Cement fills the vertebral body, crossing the midline, and extending from the inferior to superior endplate. There is no extraosseous cement. COMPLICATIONS: None IMPRESSION: Successful L2 kyphoplasty. Electronically Signed   By: Jolaine Click M.D.   On: 06/23/2015 11:10   I have personally reviewed and evaluated these images and lab results as part of my medical decision-making.   EKG Interpretation   Date/Time:  Wednesday June 23 2015 13:18:54 EST Ventricular Rate:  130 PR Interval:    QRS Duration: 80 QT Interval:  319 QTC Calculation: 469 R Axis:   -38 Text Interpretation:  Atrial fibrillation LVH with secondary  repolarization abnormality Baseline wander in lead(s) V5 V6 Patient  continues to be in atrial fibrillation with RVR Confirmed by NGUYEN, EMILY  ([id number]) on 06/23/2015 5:11:03 PM      MDM  Patient was seen and evaluated in stable condition although noted to be in atrial fibrillation with RVR on presentation. As this appeared to have occurred after the procedure today cardiology was consulted right away for guidance and what they wanted to do for treatment. During cardiologist G's evaluation bedside the patient spontaneously converted to normal sinus rhythm. She had been asymptomatic even when in atrial fibrillation with rapid ventricular response. At this time cardiology recommended discharge  with medication adjustments. They started her on Cardizem and discontinued her Norvasc. They recommended her taking aspirin daily and said that they would start anticoagulation next week in their office. I also discussed post procedure instructions with Dr. Bonnielee Haff the interventional radiologist who recommended that the patient get up and be active as soon as possible. All changes to medications and recommendations from cardiology and interventional radiology were discussed with the patient's family at bedside. They express understanding and agreement with plan of care. Patient was discharged home in the care of her family in stable condition. Final diagnoses:  Atrial fibrillation, unspecified type (HCC)  S/P kyphoplasty    1. Atrial fibrillation  2. S/P kyphoplasty    Irving Burton  Dolores Hoose, MD 06/23/15 [phone]

## 2015-06-23 NOTE — Procedures (Signed)
L2 KP No comp/EBL

## 2015-06-23 NOTE — H&P (Signed)
Chief Complaint: Patient was seen in consultation today for L2 vertebroplasty/kyphoplasty at the request of Freda MunroKhan, Saadat  Referring Physician(s): Dr Welton FlakesKhan  History of Present Illness: Alyssa Cummings is a 82 y.o. female   Pt suffered fall at home 05/29/15 All imaging reveals Lumbar 2 acute fracture IMPRESSION: 1. Subacute superior endplate compression deformity at L2. Minimal canal encroachment with approximately 20% vertebral body height loss. 2. Chronic L1 compression deformity, status post vertebral augmentation. 3. Spondylosis with chronic L3-4 grade 1 anterolisthesis.  Was scheduled for L2 VP/KP 12/29 but was noted to have leukocytosis Pt afebrile ; now wbc 8.9 CXR wnl Scheduled to move ahead with L2 Vertebroplasty/kyphoplasty today  (previous L1 KP 2014)  Past Medical History  Diagnosis Date  . Hypertension   . High cholesterol   . Back pain   . Macular degeneration   . GERD (gastroesophageal reflux disease)   . OA (osteoarthritis)   . Osteoporosis   . DJD (degenerative joint disease) of cervical spine   . Moderate aortic stenosis     a. 07/2013 Echo: EF 60-65%, Gr 1 DD, mild AI, mod AS, tirv MR, mildly dil LA.  . Falls     a. uses walker, PT 3x/wk. Last fall 03/2014.    Past Surgical History  Procedure Laterality Date  . Cholecystectomy    . Back surgery    . Breast surgery      Allergies: Codeine  Medications: Prior to Admission medications   Medication Sig Start Date End Date Taking? Authorizing Provider  acetaminophen (TYLENOL) 650 MG CR tablet Take 650 mg by mouth every 6 (six) hours as needed for pain.   Yes Historical Provider, MD  alendronate (FOSAMAX) 70 MG tablet Take 70 mg by mouth every 7 (seven) days. Take with a full glass of water on an empty stomach. Take on Saturdays   Yes Historical Provider, MD  amitriptyline (ELAVIL) 50 MG tablet Take 50 mg by mouth at bedtime.   Yes Historical Provider, MD  amLODipine (NORVASC) 5 MG tablet Take 5  mg by mouth every morning.    Yes Historical Provider, MD  aspirin EC 81 MG EC tablet Take 1 tablet (81 mg total) by mouth daily. 04/16/14  Yes Dwana MelenaBryan W Hager, PA-C  atorvastatin (LIPITOR) 40 MG tablet Take 40 mg by mouth at bedtime.    Yes Historical Provider, MD  carvedilol (COREG) 3.125 MG tablet Take 1 tablet (3.125 mg total) by mouth 2 (two) times daily with a meal. 10/28/14  Yes Mihai Croitoru, MD  docusate sodium (COLACE) 100 MG capsule Take 100 mg by mouth at bedtime.    Yes Historical Provider, MD  HYDROcodone-acetaminophen (HYCET) 7.5-325 mg/15 ml solution Take 5 mLs by mouth every 4 (four) hours as needed. 06/01/15  Yes Historical Provider, MD  lansoprazole (PREVACID) 30 MG capsule Take 30 mg by mouth every other day.    Yes Historical Provider, MD  latanoprost (XALATAN) 0.005 % ophthalmic solution Place 1 drop into both eyes at bedtime.   Yes Historical Provider, MD  lidocaine (LIDODERM) 5 % Place 1 patch onto the skin daily. Apply on back, Remove & Discard patch within 12 hours or as directed by MD 06/03/15  Yes Rolly SalterPranav M Patel, MD  lisinopril (PRINIVIL,ZESTRIL) 40 MG tablet Take 40 mg by mouth every morning.    Yes Historical Provider, MD  methocarbamol (ROBAXIN) 500 MG tablet Take 1 tablet (500 mg total) by mouth every 6 (six) hours as needed for muscle spasms. 06/03/15  Yes Rolly Salter, MD  naproxen (NAPROSYN) 500 MG tablet Take 1 tablet (500 mg total) by mouth 3 (three) times daily with meals. 06/03/15  Yes Rolly Salter, MD  polyethylene glycol (MIRALAX / GLYCOLAX) packet Take 17 g by mouth daily as needed for mild constipation. 06/03/15  Yes Rolly Salter, MD  tolterodine (DETROL LA) 2 MG 24 hr capsule Take 2 mg by mouth every morning.   Yes Historical Provider, MD  traMADol (ULTRAM) 50 MG tablet Take 0.5 tablets (25 mg total) by mouth every 6 (six) hours as needed for severe pain. 06/03/15  Yes Rolly Salter, MD     Family History  Problem Relation Age of Onset  . Heart  attack Mother   . Heart attack Father     Social History   Social History  . Marital Status: Widowed    Spouse Name: [name]  . Number of Children: [name]  . Years of Education: [name]   Social History Main Topics  . Smoking status: Never Smoker   . Smokeless tobacco: None  . Alcohol Use: No  . Drug Use: No  . Sexual Activity: Not Asked   Other Topics Concern  . None   Social History Narrative     Review of Systems: A 12 point ROS discussed and pertinent positives are indicated in the HPI above.  All other systems are negative.  Review of Systems  Constitutional: Positive for activity change and appetite change. Negative for fever and fatigue.  Respiratory: Negative for cough and shortness of breath.   Musculoskeletal: Positive for back pain.  Neurological: Positive for weakness.  Psychiatric/Behavioral: Negative for behavioral problems and confusion.    Vital Signs: BP 169/71 mmHg  Pulse 74  Temp(Src) 97.4 F (36.3 C)  Resp 18  Ht 5' (1.524 m)  Wt 110 lb (49.896 kg)  BMI 21.48 kg/m2  SpO2 97%  Physical Exam  Constitutional: She is oriented to person, place, and time.  Cardiovascular: Normal rate and regular rhythm.   Murmur heard. Pulmonary/Chest: Effort normal and breath sounds normal. She has no wheezes.  Abdominal: Soft. Bowel sounds are normal. There is no tenderness.  Musculoskeletal: Normal range of motion.  Low back pain  Neurological: She is alert and oriented to person, place, and time.  Skin: Skin is warm and dry.  Psychiatric: She has a normal mood and affect. Her behavior is normal. Judgment and thought content normal.  Nursing note and vitals reviewed.   Mallampati Score:  MD Evaluation Airway: WNL Heart: WNL Abdomen: WNL Chest/ Lungs: WNL ASA  Classification: 3 Mallampati/Airway Score: One  Imaging: Dg Chest 2 View  06/17/2015  CLINICAL DATA:  Leukocytosis EXAM: CHEST  2 VIEW COMPARISON:  05/31/2015 FINDINGS: Densely calcified aortic  arch. Heart is borderline in size. No confluent airspace opacities or effusions. Degenerative changes in the thoracic spine and shoulders. IMPRESSION: No active cardiopulmonary disease. Electronically Signed   By: Charlett Nose M.D.   On: 06/17/2015 10:51   Dg Thoracic Spine W/swimmers  05/31/2015  CLINICAL DATA:  Status post fall in living room, with upper back pain. Initial encounter. EXAM: THORACIC SPINE - 3 VIEWS COMPARISON:  Chest radiograph performed 04/21/2015 FINDINGS: There is no evidence of fracture or subluxation. Vertebral bodies demonstrate normal height and alignment. Intervertebral disc spaces are preserved. The patient is status post vertebroplasty at the lower thoracic spine. Large anterior osteophytes are noted along the thoracic spine. The visualized portions of both lungs are clear. The mediastinum is  unremarkable in appearance. IMPRESSION: 1. No evidence of fracture or subluxation along the thoracic spine. 2. Mild diffuse degenerative change along the thoracic spine. Electronically Signed   By: Roanna Raider M.D.   On: 05/31/2015 23:15   Dg Lumbar Spine Complete  05/31/2015  CLINICAL DATA:  Larey Seat last night in the living room.  Lumbar pain. EXAM: LUMBAR SPINE - COMPLETE 4+ VIEW COMPARISON:  CT lumbar spine 10/17/2012. Lumbar spine radiographs 10/08/2012. FINDINGS: Diffuse degenerative change throughout the lumbar spine with narrowed lumbar interspaces and associated endplate hypertrophic changes. Multilevel degenerative disc disease. Diffuse bone demineralization likely due to osteoporosis. Anterior compression of the L1 vertebra. Interval kyphoplasty changes at this level. Ballooning of the interspace at L1-2 suggesting developing compression of the superior endplate of L2, likely osteoporotic. No acute fracture is demonstrated. Normal alignment of the lumbar spine. Visualize sacral struts appear intact. IMPRESSION: Diffuse degenerative change throughout the lumbar spine.  Demineralization with vertebral compression deformities consistent with osteoporosis. Compression of superior endplate of L2 since previous study appears represent chronic osteoporotic change. No acute displaced fractures identified. Electronically Signed   By: Burman Nieves M.D.   On: 05/31/2015 23:15   Ct Head Wo Contrast  06/02/2015  CLINICAL DATA:  Reported falls.  Increased confusion. EXAM: CT HEAD WITHOUT CONTRAST CT CERVICAL SPINE WITHOUT CONTRAST TECHNIQUE: Multidetector CT imaging of the head and cervical spine was performed following the standard protocol without intravenous contrast. Multiplanar CT image reconstructions of the cervical spine were also generated. COMPARISON:  Brain MR of 12/09/2014. Head and C-spine CT of 05/31/2014. FINDINGS: CT HEAD FINDINGS Sinuses/Soft tissues: Minimal mucosal thickening of left ethmoid air cells. Minimal motion degradation. No significant soft tissue swelling. No skull fracture. Clear mastoid air cells. Intracranial: Marked low density in the periventricular white matter likely related to small vessel disease. Expected cerebral volume loss for age. Ventriculomegaly which is felt to be secondary. No mass lesion, hemorrhage, hydrocephalus, acute infarct, intra-axial, or extra-axial fluid collection. CT CERVICAL SPINE FINDINGS Spinal visualization through the bottom of T1. Prevertebral soft tissues are within normal limits. Re- demonstration of a right sided thyroid mass on the order of 3.8 cm. No apical pneumothorax. Mild motion degradation at the level of C1-2. Spondylosis, including degenerate disc disease at C4-5 and disc osteophyte complex. Facet arthropathy at multiple levels. IMPRESSION: 1.  No acute intracranial abnormality. 2. Marked small vessel ischemic change. 3. Cervical spondylosis, without acute fracture or subluxation. 4. Mild motion degradation involving the head and cervical spine (especially at C1-2). Electronically Signed   By: Jeronimo Greaves  M.D.   On: 06/02/2015 19:05   Ct Cervical Spine Wo Contrast  06/02/2015  CLINICAL DATA:  Reported falls.  Increased confusion. EXAM: CT HEAD WITHOUT CONTRAST CT CERVICAL SPINE WITHOUT CONTRAST TECHNIQUE: Multidetector CT imaging of the head and cervical spine was performed following the standard protocol without intravenous contrast. Multiplanar CT image reconstructions of the cervical spine were also generated. COMPARISON:  Brain MR of 12/09/2014. Head and C-spine CT of 05/31/2014. FINDINGS: CT HEAD FINDINGS Sinuses/Soft tissues: Minimal mucosal thickening of left ethmoid air cells. Minimal motion degradation. No significant soft tissue swelling. No skull fracture. Clear mastoid air cells. Intracranial: Marked low density in the periventricular white matter likely related to small vessel disease. Expected cerebral volume loss for age. Ventriculomegaly which is felt to be secondary. No mass lesion, hemorrhage, hydrocephalus, acute infarct, intra-axial, or extra-axial fluid collection. CT CERVICAL SPINE FINDINGS Spinal visualization through the bottom of T1. Prevertebral soft tissues are  within normal limits. Re- demonstration of a right sided thyroid mass on the order of 3.8 cm. No apical pneumothorax. Mild motion degradation at the level of C1-2. Spondylosis, including degenerate disc disease at C4-5 and disc osteophyte complex. Facet arthropathy at multiple levels. IMPRESSION: 1.  No acute intracranial abnormality. 2. Marked small vessel ischemic change. 3. Cervical spondylosis, without acute fracture or subluxation. 4. Mild motion degradation involving the head and cervical spine (especially at C1-2). Electronically Signed   By: Jeronimo Greaves M.D.   On: 06/02/2015 19:05   Ct Lumbar Spine Wo Contrast  06/01/2015  CLINICAL DATA:  Fall on Sunday. Worsening lower back pain. Prior vertebroplasty. EXAM: CT LUMBAR SPINE WITHOUT CONTRAST TECHNIQUE: Multidetector CT imaging of the lumbar spine was performed without  intravenous contrast administration. Multiplanar CT image reconstructions were also generated. COMPARISON:  Plain films of 1 day prior and CT of 10/17/2012 FINDINGS: Soft tissues: Abdominal aortic atherosclerosis. Extensive colonic diverticulosis. No paravertebral hematoma. Bones: Osteopenia. Bone island in the left iliac. Multilevel lumbar facet arthropathy. Prior vertebral augmentation at the L1 vertebral body. An underlying moderate compression deformity with ventral canal encroachment is grossly similar to 10/17/2012 otherwise. L2 superior endplate compression deformity is subacute. Linear fracture lines identified including on image 31/ series 8. Mild (20%) vertebral body height loss with sclerosis of the superior aspect of the vertebral body. Minimal ventral canal encroachment including on image 29/series 8. No extension into the posterior elements Remainder vertebral body height is maintained. Grade 1 L3-4 anterolisthesis is unchanged. mild convex left lumbar spine curvature. Multifactorial central canal stenosis at L3-4 and L4-5. Prior laminectomies at L3-4. IMPRESSION: 1. Subacute superior endplate compression deformity at L2. Minimal canal encroachment with approximately 20% vertebral body height loss. 2. Chronic L1 compression deformity, status post vertebral augmentation. 3. Spondylosis with chronic L3-4 grade 1 anterolisthesis. Electronically Signed   By: Jeronimo Greaves M.D.   On: 06/01/2015 20:58   Ir Radiologist Eval & Mgmt  06/17/2015  EXAM: ESTABLISHED PATIENT OFFICE VISIT CHIEF COMPLAINT: Back pain HISTORY OF PRESENT ILLNESS: Patient is a 82 year old female who had a fall May 29, 2015 with lower back pain after her fall. She is on high doses of pain medication, which has been unsuccessful with controlling her pain. CT scan performed 06/01/2015 demonstrates L2 compression fracture. She has been previously treated for L1 compression fracture 2014 with excellent relief of symptoms. She denies  fevers, rigors, chills. Lab values do demonstrate leukocytosis, of uncertain origin. PHYSICAL EXAMINATION: Focal back pain centered over the L2 region, confirmed with fluoroscopic guidance. ASSESSMENT AND PLAN: Given the patient's leukocytosis, we will investigate with urinalysis and a chest x-ray. I think best practice will be to delay until we are certain there is no infectious source. I discussed decreasing her risk with the leg until next week with the family and the patient, who are in agreement with the strategy of risk reduction. We will order urinalysis and a chest x-ray today. Hopefully we can treat her within a week. Signed, Yvone Neu. Loreta Ave, DO [facility] Electronically Signed   By: Gilmer Mor D.O.   On: 06/17/2015 11:38    Labs:  CBC:  Recent Labs  06/02/15 0430 06/03/15 0756 06/17/15 0815 06/22/15 1255  WBC 9.4 9.6 11.6* 8.9  HGB 13.5 12.2 12.8 12.9  HCT 41.2 37.8 39.5 40.1  PLT 180 171 295 263    COAGS:  Recent Labs  06/02/15 0430 06/03/15 0756 06/17/15 0815 06/22/15 1255  INR 1.11 1.17 1.10 1.11  APTT  --   --   --  31    BMP:  Recent Labs  12/09/14 1841  06/01/15 2035 06/02/15 0430 06/03/15 0756 06/22/15 1255  NA 141  < > 140 140 142 140  K 4.0  < > 4.0 3.7 4.0 4.1  CL 105  < > 105 105 108 105  CO2 28  --   --  25 24 28   GLUCOSE 112*  < > 144* 119* 126* 103*  BUN 10  < > 11 11 17 14   CALCIUM 9.4  --   --  9.2 8.9 9.2  CREATININE 0.74  < > 0.60 0.67 0.71 0.56  GFRNONAA >60  --   --  >60 >60 >60  GFRAA >60  --   --  >60 >60 >60  < > = values in this interval not displayed.  LIVER FUNCTION TESTS:  Recent Labs  12/09/14 1841 06/02/15 0430 06/03/15 0756  BILITOT 0.4 1.1 0.8  AST 18 49* 28  ALT 16 46 37  ALKPHOS 91 96 92  PROT 6.9 7.1 6.5  ALBUMIN 3.8 3.9 3.3*    TUMOR MARKERS: No results for input(s): AFPTM, CEA, CA199, CHROMGRNA in the last 8760 hours.  Assessment and  Plan:  Acute lumbar 2 fracture Scheduled for Lumbar 2 VP/KP today Risks and Benefits discussed with the patient including, but not limited to education regarding the natural healing process of compression fractures without intervention, bleeding, infection, cement migration which may cause spinal cord damage, paralysis, pulmonary embolism or even death. All of the patient's questions were answered, patient is agreeable to proceed. Consent signed and in chart.    Thank you for this interesting consult.  I greatly enjoyed meeting Estefania Kamiya and look forward to participating in their care.  A copy of this report was sent to the requesting provider on this date.  Signed: Dalene Robards A 06/23/2015, 8:04 AM   I spent a total of  30 Minutes   in face to face in clinical consultation, greater than 50% of which was counseling/coordinating care for L2 KP/VP

## 2015-06-23 NOTE — Discharge Instructions (Signed)
You were seen today for your rapid heart rate after your procedure. He were found to be in atrial fibrillation. Cardiology recommended some changes to your medications. Of note they want me to take aspirin every day. They want to to take only 20 mg of your lisinopril daily. They also want you to stop taking her Norvasc. They want to just start taking Cardizem 30 mg every 6 hours.  They have arranged a follow-up appointment for you for Monday. Please keep this appointment.  I talked to Dr. Bonnielee Haff who did your procedure today. He recommends that you get up and walk as soon as you can and try to advance activity as quickly as you can tolerate. He said he discussed physical therapy with you. He said that he would like to see you in his office in 2 weeks. He can continue your pain medication as needed.  Atrial Fibrillation Atrial fibrillation is a type of irregular or rapid heartbeat (arrhythmia). In atrial fibrillation, the heart quivers continuously in a chaotic pattern. This occurs when parts of the heart receive disorganized signals that make the heart unable to pump blood normally. This can increase the risk for stroke, heart failure, and other heart-related conditions. There are different types of atrial fibrillation, including:  Paroxysmal atrial fibrillation. This type starts suddenly, and it usually stops on its own shortly after it starts.  Persistent atrial fibrillation. This type often lasts longer than a week. It may stop on its own or with treatment.  Long-lasting persistent atrial fibrillation. This type lasts longer than 12 months.  Permanent atrial fibrillation. This type does not go away. Talk with your health care provider to learn about the type of atrial fibrillation that you have. CAUSES This condition is caused by some heart-related conditions or procedures, including:  A heart attack.  Coronary artery disease.  Heart failure.  Heart valve conditions.  High blood  pressure.  Inflammation of the sac that surrounds the heart (pericarditis).  Heart surgery.  Certain heart rhythm disorders, such as Wolf-Parkinson-White syndrome. Other causes include:  Pneumonia.  Obstructive sleep apnea.  Blockage of an artery in the lungs (pulmonary embolism, or PE).  Lung cancer.  Chronic lung disease.  Thyroid problems, especially if the thyroid is overactive (hyperthyroidism).  Caffeine.  Excessive alcohol use or illegal drug use.  Use of some medicines, including certain decongestants and diet pills. Sometimes, the cause cannot be found. RISK FACTORS This condition is more likely to develop in:  People who are older in age.  People who smoke.  People who have diabetes mellitus.  People who are overweight (obese).  Athletes who exercise vigorously. SYMPTOMS Symptoms of this condition include:  A feeling that your heart is beating rapidly or irregularly.  A feeling of discomfort or pain in your chest.  Shortness of breath.  Sudden light-headedness or weakness.  Getting tired easily during exercise. In some cases, there are no symptoms. DIAGNOSIS Your health care provider may be able to detect atrial fibrillation when taking your pulse. If detected, this condition may be diagnosed with:  An electrocardiogram (ECG).  A Holter monitor test that records your heartbeat patterns over a 24-hour period.  Transthoracic echocardiogram (TTE) to evaluate how blood flows through your heart.  Transesophageal echocardiogram (TEE) to view more detailed images of your heart.  A stress test.  Imaging tests, such as a CT scan or chest X-ray.  Blood tests. TREATMENT The main goals of treatment are to prevent blood clots from forming and to  keep your heart beating at a normal rate and rhythm. The type of treatment that you receive depends on many factors, such as your underlying medical conditions and how you feel when you are experiencing atrial  fibrillation. This condition may be treated with:  Medicine to slow down the heart rate, bring the heart's rhythm back to normal, or prevent clots from forming.  Electrical cardioversion. This is a procedure that resets your heart's rhythm by delivering a controlled, low-energy shock to the heart through your skin.  Different types of ablation, such as catheter ablation, catheter ablation with pacemaker, or surgical ablation. These procedures destroy the heart tissues that send abnormal signals. When the pacemaker is used, it is placed under your skin to help your heart beat in a regular rhythm. HOME CARE INSTRUCTIONS  Take over-the counter and prescription medicines only as told by your health care provider.  If your health care provider prescribed a blood-thinning medicine (anticoagulant), take it exactly as told. Taking too much blood-thinning medicine can cause bleeding. If you do not take enough blood-thinning medicine, you will not have the protection that you need against stroke and other problems.  Do not use tobacco products, including cigarettes, chewing tobacco, and e-cigarettes. If you need help quitting, ask your health care provider.  If you have obstructive sleep apnea, manage your condition as told by your health care provider.  Do not drink alcohol.  Do not drink beverages that contain caffeine, such as coffee, soda, and tea.  Maintain a healthy weight. Do not use diet pills unless your health care provider approves. Diet pills may make heart problems worse.  Follow diet instructions as told by your health care provider.  Exercise regularly as told by your health care provider.  Keep all follow-up visits as told by your health care provider. This is important. PREVENTION  Avoid drinking beverages that contain caffeine or alcohol.  Avoid certain medicines, especially medicines that are used for breathing problems.  Avoid certain herbs and herbal medicines, such as  those that contain ephedra or ginseng.  Do not use illegal drugs, such as cocaine and amphetamines.  Do not smoke.  Manage your high blood pressure. SEEK MEDICAL CARE IF:  You notice a change in the rate, rhythm, or strength of your heartbeat.  You are taking an anticoagulant and you notice increased bruising.  You tire more easily when you exercise or exert yourself. SEEK IMMEDIATE MEDICAL CARE IF:  You have chest pain, abdominal pain, sweating, or weakness.  You feel nauseous.  You notice blood in your vomit, bowel movement, or urine.  You have shortness of breath.  You suddenly have swollen feet and ankles.  You feel dizzy.  You have sudden weakness or numbness of the face, arm, or leg, especially on one side of the body.  You have trouble speaking, trouble understanding, or both (aphasia).  Your face or your eyelid droops on one side. These symptoms may represent a serious problem that is an emergency. Do not wait to see if the symptoms will go away. Get medical help right away. Call your local emergency services (911 in the [place] Do not drive yourself to the hospital.   This information is not intended to replace advice given to you by your health care provider. Make sure you discuss any questions you have with your health care provider.   Document Released: 06/05/2005 Document Revised: 02/24/2015 Document Reviewed: 09/30/2014 [employer] Interactive Patient Education 2016 [employer]  Percutaneous Vertebroplasty, Care After These  instructions give you information on caring for yourself after your procedure. Your doctor may also give you more specific instructions. Call your doctor if you have any problems or questions after your procedure. HOME CARE  Take medicine as told by your doctor.  Keep your wound dry and covered for 24 hours or as told by your doctor.  Ask your doctor when you can bathe or shower.  Put an ice pack on your wound.  Put ice in a plastic  bag.  Place a towel between your skin and the bag.  Leave the ice on for 15-20 minutes, 3-4 times a day.  Rest in your bed for 24 hours or as told by your doctor.  Return to normal activities as told by your doctor.  Ask your doctor what stretches and exercises you can do.  Do not bend or lift anything heavy as told by your doctor. GET HELP IF:  Your wound becomes red, puffy (swollen), or tender to the touch.  You are bleeding or leaking fluid from the wound.  You are sick to your stomach (nauseous) or throw up (vomit) for more than 24 hours after the procedure.  Your back pain does not get better.  You have a fever. GET HELP RIGHT AWAY IF:   You have bad back pain that comes on suddenly.  You cannot control when you pee (urinate) or poop (bowel movement).  You lose feeling (numbness) or have tingling in your legs or feet, or they become weak.  You have sudden weakness in your arms or legs.  You have shooting pain down your legs.  You have chest pain or a hard time breathing.  You feel dizzy or pass out (faint).  Your vision changes or you cannot talk as you normally do.   This information is not intended to replace advice given to you by your health care provider. Make sure you discuss any questions you have with your health care provider.   Document Released: 08/30/2009 Document Revised: 06/10/2013 Document Reviewed: 02/11/2013 [employer] Interactive Patient Education [employer]

## 2015-06-23 NOTE — H&P (Deleted)
Patient ID: Alyssa Cummings MRN: [id number], DOB/AGE: Jun 30, 1917   Admit date: 06/23/2015   Primary Physician: Neldon Labella, MD Primary Cardiologist: Dr. Royann Shivers  Pt. Profile:  pleasant 82 year old female with past medical history of hypertension, hyperlipidemia and likely severe AS presented with kyphoplasty for compression fx after recent fall, post procedure, it was noted she was in afib with RVR  Problem List  Past Medical History  Diagnosis Date  . Hypertension   . High cholesterol   . Back pain   . Macular degeneration   . GERD (gastroesophageal reflux disease)   . OA (osteoarthritis)   . Osteoporosis   . DJD (degenerative joint disease) of cervical spine   . Moderate aortic stenosis     a. 07/2013 Echo: EF 60-65%, Gr 1 DD, mild AI, mod AS, tirv MR, mildly dil LA.  . Falls     a. uses walker, PT 3x/wk. Last fall 03/2014.    Past Surgical History  Procedure Laterality Date  . Cholecystectomy    . Back surgery    . Breast surgery       Allergies  Allergies  Allergen Reactions  . Codeine Nausea And Vomiting    HPI  The patient is a pleasant 82 year old female with past medical history of hypertension, hyperlipidemia and likely severe AS. She has no prior history of atrial fibrillation. She lives in [place] in the independent living section and walks on daily basis. She continued to participate in all local activities which is extraordinarily given her age. Her last EKG in November 2016 shows she was in sinus rhythm. Her last office visit with Dr. Royann Shivers was on 05/06/2015 at which time he was worried about worsening AS as she began to have exertional dyspnea. She denies any chest pain. Echocardiogram was repeated on 05/19/2015 which showed EF 55-60%, grade 1 diastolic dysfunction, moderate AS with mild AR, mild MR with severe annular calcification. It was felt his probable severe AS, therefore a left and right heart cath was scheduled for 06/24/2015 to further  evaluate the degree of aortic stenosis. Unfortunately, patient had a fall and was admitted to the hospital on 06/01/2015 where x-ray showed the presence of L2 compression fracture. TSH on 12/14 was negative. Hemoglobin A1c 5.6. CT of the head was negative for acute process. CT of lumbar spine showed subacute superior endplate compression deformity at L2. She was supposed to undergo kyphoplasty on 12/29 as outpatient, however she was noted to have leukocytosis with a shift. Despite her not complaining of any fever or chill, there was concern about possible underlying infection. Therefore the procedure was delayed. She underwent the same procedure on 06/23/2015, post procedure, she was noted to be atrial fibrillation with RVR which according to the family is new. Unfortunately, I tried to call the short stay nurse who already left for the day, there was no strip to support normal sinus rhythm prior to the procedure, therefore although per family her atrial fibrillation was only started this morning, there is no definitive proof.  She was sent from short stay to the ED for further evaluation. EKG shows she is in atrial fibrillation with RVR. Troponin was negative. Significant laboratory finding include a white blood cell count of 12.4. Chest x-ray was negative for acute process. Her radiology note, she had successful L2 kyphoplasty.   Home Medications  Prior to Admission medications   Medication Sig Start Date End Date Taking? Authorizing Provider  acetaminophen (TYLENOL) 650 MG CR tablet Take 650 mg by  mouth every 6 (six) hours as needed for pain.    Historical Provider, MD  alendronate (FOSAMAX) 70 MG tablet Take 70 mg by mouth every 7 (seven) days. Take with a full glass of water on an empty stomach. Take on Saturdays    Historical Provider, MD  amitriptyline (ELAVIL) 50 MG tablet Take 50 mg by mouth at bedtime.    Historical Provider, MD  amLODipine (NORVASC) 5 MG tablet Take 5 mg by mouth every morning.      Historical Provider, MD  aspirin EC 81 MG EC tablet Take 1 tablet (81 mg total) by mouth daily. 04/16/14   Dwana Melena, PA-C  atorvastatin (LIPITOR) 40 MG tablet Take 40 mg by mouth at bedtime.     Historical Provider, MD  carvedilol (COREG) 3.125 MG tablet Take 1 tablet (3.125 mg total) by mouth 2 (two) times daily with a meal. 10/28/14   Mihai Croitoru, MD  docusate sodium (COLACE) 100 MG capsule Take 100 mg by mouth at bedtime.     Historical Provider, MD  HYDROcodone-acetaminophen (HYCET) 7.5-325 mg/15 ml solution Take 5 mLs by mouth every 4 (four) hours as needed. 06/01/15   Historical Provider, MD  lansoprazole (PREVACID) 30 MG capsule Take 30 mg by mouth every other day.     Historical Provider, MD  latanoprost (XALATAN) 0.005 % ophthalmic solution Place 1 drop into both eyes at bedtime.    Historical Provider, MD  lidocaine (LIDODERM) 5 % Place 1 patch onto the skin daily. Apply on back, Remove & Discard patch within 12 hours or as directed by MD 06/03/15   Rolly Salter, MD  lisinopril (PRINIVIL,ZESTRIL) 40 MG tablet Take 40 mg by mouth every morning.     Historical Provider, MD  methocarbamol (ROBAXIN) 500 MG tablet Take 1 tablet (500 mg total) by mouth every 6 (six) hours as needed for muscle spasms. 06/03/15   Rolly Salter, MD  naproxen (NAPROSYN) 500 MG tablet Take 1 tablet (500 mg total) by mouth 3 (three) times daily with meals. 06/03/15   Rolly Salter, MD  polyethylene glycol (MIRALAX / Ethelene Hal) packet Take 17 g by mouth daily as needed for mild constipation. 06/03/15   Rolly Salter, MD  tolterodine (DETROL LA) 2 MG 24 hr capsule Take 2 mg by mouth every morning.    Historical Provider, MD  traMADol (ULTRAM) 50 MG tablet Take 0.5 tablets (25 mg total) by mouth every 6 (six) hours as needed for severe pain. 06/03/15   Rolly Salter, MD    Family History  Family History  Problem Relation Age of Onset  . Heart attack Mother   . Heart attack Father     Social  History  Social History   Social History  . Marital Status: Widowed    Spouse Name: [name]  . Number of Children: [name]  . Years of Education: [name]   Occupational History  . Not on file.   Social History Main Topics  . Smoking status: Never Smoker   . Smokeless tobacco: Not on file  . Alcohol Use: No  . Drug Use: No  . Sexual Activity: Not on file   Other Topics Concern  . Not on file   Social History Narrative     Review of Systems General:  No chills, fever, night sweats or weight changes.  Cardiovascular:  No chest pain, dyspnea on exertion, edema, orthopnea, palpitations, paroxysmal nocturnal dyspnea.  Dermatological: No rash, lesions/masses Respiratory: No cough, dyspnea Urologic:  No hematuria, dysuria Abdominal:   No nausea, vomiting, diarrhea, bright red blood per rectum, melena, or hematemesis Neurologic:  No visual changes, wkns, changes in mental status. Back sore.  All other systems reviewed and are otherwise negative except as noted above.  Physical Exam  Blood pressure 104/74, pulse 120, temperature 98.1 F (36.7 C), temperature source Axillary, resp. rate 21, SpO2 97 %.  General: Pleasant, NAD Psych: Normal affect. Neuro: Alert and oriented X 3. Moves all extremities spontaneously. HEENT: Normal  Neck: Supple without bruits or JVD. Lungs:  Resp regular and unlabored, CTA. Heart: tachycardic, irregular. no s3, s4, or murmurs. Abdomen: Soft, non-tender, non-distended, BS + x 4.  Extremities: No clubbing, cyanosis or edema. DP/PT/Radials 2+ and equal bilaterally.  Labs  Troponin (Point of Care Test)  Recent Labs  06/23/15 1428  TROPIPOC 0.06   No results for input(s): CKTOTAL, CKMB, TROPONINI in the last 72 hours. Lab Results  Component Value Date   WBC 12.4* 06/23/2015   HGB 12.7 06/23/2015   HCT 38.8 06/23/2015   MCV 91.7 06/23/2015   PLT 246 06/23/2015    Recent Labs Lab 06/22/15 1255  NA 140  K 4.1  CL 105  CO2 28  BUN 14   CREATININE 0.56  CALCIUM 9.2  GLUCOSE 103*   Lab Results  Component Value Date   CHOL 177 06/23/2015   HDL 54 06/23/2015   LDLCALC 96 06/23/2015   TRIG 137 06/23/2015   No results found for: DDIMER   Radiology/Studies  Dg Chest 2 View  06/17/2015  CLINICAL DATA:  Leukocytosis EXAM: CHEST  2 VIEW COMPARISON:  05/31/2015 FINDINGS: Densely calcified aortic arch. Heart is borderline in size. No confluent airspace opacities or effusions. Degenerative changes in the thoracic spine and shoulders. IMPRESSION: No active cardiopulmonary disease. Electronically Signed   By: Charlett NoseKevin  Dover M.D.   On: 06/17/2015 10:51   Dg Thoracic Spine W/swimmers  05/31/2015  CLINICAL DATA:  Status post fall in living room, with upper back pain. Initial encounter. EXAM: THORACIC SPINE - 3 VIEWS COMPARISON:  Chest radiograph performed 04/21/2015 FINDINGS: There is no evidence of fracture or subluxation. Vertebral bodies demonstrate normal height and alignment. Intervertebral disc spaces are preserved. The patient is status post vertebroplasty at the lower thoracic spine. Large anterior osteophytes are noted along the thoracic spine. The visualized portions of both lungs are clear. The mediastinum is unremarkable in appearance. IMPRESSION: 1. No evidence of fracture or subluxation along the thoracic spine. 2. Mild diffuse degenerative change along the thoracic spine. Electronically Signed   By: Roanna RaiderJeffery  Chang M.D.   On: 05/31/2015 23:15   Dg Lumbar Spine Complete  05/31/2015  CLINICAL DATA:  Larey SeatFell last night in the living room.  Lumbar pain. EXAM: LUMBAR SPINE - COMPLETE 4+ VIEW COMPARISON:  CT lumbar spine 10/17/2012. Lumbar spine radiographs 10/08/2012. FINDINGS: Diffuse degenerative change throughout the lumbar spine with narrowed lumbar interspaces and associated endplate hypertrophic changes. Multilevel degenerative disc disease. Diffuse bone demineralization likely due to osteoporosis. Anterior compression of the L1  vertebra. Interval kyphoplasty changes at this level. Ballooning of the interspace at L1-2 suggesting developing compression of the superior endplate of L2, likely osteoporotic. No acute fracture is demonstrated. Normal alignment of the lumbar spine. Visualize sacral struts appear intact. IMPRESSION: Diffuse degenerative change throughout the lumbar spine. Demineralization with vertebral compression deformities consistent with osteoporosis. Compression of superior endplate of L2 since previous study appears represent chronic osteoporotic change. No acute displaced fractures identified. Electronically Signed  By: Burman NievesWilliam  Stevens M.D.   On: 05/31/2015 23:15   Ct Head Wo Contrast  06/02/2015  CLINICAL DATA:  Reported falls.  Increased confusion. EXAM: CT HEAD WITHOUT CONTRAST CT CERVICAL SPINE WITHOUT CONTRAST TECHNIQUE: Multidetector CT imaging of the head and cervical spine was performed following the standard protocol without intravenous contrast. Multiplanar CT image reconstructions of the cervical spine were also generated. COMPARISON:  Brain MR of 12/09/2014. Head and C-spine CT of 05/31/2014. FINDINGS: CT HEAD FINDINGS Sinuses/Soft tissues: Minimal mucosal thickening of left ethmoid air cells. Minimal motion degradation. No significant soft tissue swelling. No skull fracture. Clear mastoid air cells. Intracranial: Marked low density in the periventricular white matter likely related to small vessel disease. Expected cerebral volume loss for age. Ventriculomegaly which is felt to be secondary. No mass lesion, hemorrhage, hydrocephalus, acute infarct, intra-axial, or extra-axial fluid collection. CT CERVICAL SPINE FINDINGS Spinal visualization through the bottom of T1. Prevertebral soft tissues are within normal limits. Re- demonstration of a right sided thyroid mass on the order of 3.8 cm. No apical pneumothorax. Mild motion degradation at the level of C1-2. Spondylosis, including degenerate disc disease at  C4-5 and disc osteophyte complex. Facet arthropathy at multiple levels. IMPRESSION: 1.  No acute intracranial abnormality. 2. Marked small vessel ischemic change. 3. Cervical spondylosis, without acute fracture or subluxation. 4. Mild motion degradation involving the head and cervical spine (especially at C1-2). Electronically Signed   By: Jeronimo GreavesKyle  Talbot M.D.   On: 06/02/2015 19:05   Ct Cervical Spine Wo Contrast  06/02/2015  CLINICAL DATA:  Reported falls.  Increased confusion. EXAM: CT HEAD WITHOUT CONTRAST CT CERVICAL SPINE WITHOUT CONTRAST TECHNIQUE: Multidetector CT imaging of the head and cervical spine was performed following the standard protocol without intravenous contrast. Multiplanar CT image reconstructions of the cervical spine were also generated. COMPARISON:  Brain MR of 12/09/2014. Head and C-spine CT of 05/31/2014. FINDINGS: CT HEAD FINDINGS Sinuses/Soft tissues: Minimal mucosal thickening of left ethmoid air cells. Minimal motion degradation. No significant soft tissue swelling. No skull fracture. Clear mastoid air cells. Intracranial: Marked low density in the periventricular white matter likely related to small vessel disease. Expected cerebral volume loss for age. Ventriculomegaly which is felt to be secondary. No mass lesion, hemorrhage, hydrocephalus, acute infarct, intra-axial, or extra-axial fluid collection. CT CERVICAL SPINE FINDINGS Spinal visualization through the bottom of T1. Prevertebral soft tissues are within normal limits. Re- demonstration of a right sided thyroid mass on the order of 3.8 cm. No apical pneumothorax. Mild motion degradation at the level of C1-2. Spondylosis, including degenerate disc disease at C4-5 and disc osteophyte complex. Facet arthropathy at multiple levels. IMPRESSION: 1.  No acute intracranial abnormality. 2. Marked small vessel ischemic change. 3. Cervical spondylosis, without acute fracture or subluxation. 4. Mild motion degradation involving the head  and cervical spine (especially at C1-2). Electronically Signed   By: Jeronimo GreavesKyle  Talbot M.D.   On: 06/02/2015 19:05   Ct Lumbar Spine Wo Contrast  06/01/2015  CLINICAL DATA:  Fall on Sunday. Worsening lower back pain. Prior vertebroplasty. EXAM: CT LUMBAR SPINE WITHOUT CONTRAST TECHNIQUE: Multidetector CT imaging of the lumbar spine was performed without intravenous contrast administration. Multiplanar CT image reconstructions were also generated. COMPARISON:  Plain films of 1 day prior and CT of 10/17/2012 FINDINGS: Soft tissues: Abdominal aortic atherosclerosis. Extensive colonic diverticulosis. No paravertebral hematoma. Bones: Osteopenia. Bone island in the left iliac. Multilevel lumbar facet arthropathy. Prior vertebral augmentation at the L1 vertebral body. An underlying moderate compression  deformity with ventral canal encroachment is grossly similar to 10/17/2012 otherwise. L2 superior endplate compression deformity is subacute. Linear fracture lines identified including on image 31/ series 8. Mild (20%) vertebral body height loss with sclerosis of the superior aspect of the vertebral body. Minimal ventral canal encroachment including on image 29/series 8. No extension into the posterior elements Remainder vertebral body height is maintained. Grade 1 L3-4 anterolisthesis is unchanged. mild convex left lumbar spine curvature. Multifactorial central canal stenosis at L3-4 and L4-5. Prior laminectomies at L3-4. IMPRESSION: 1. Subacute superior endplate compression deformity at L2. Minimal canal encroachment with approximately 20% vertebral body height loss. 2. Chronic L1 compression deformity, status post vertebral augmentation. 3. Spondylosis with chronic L3-4 grade 1 anterolisthesis. Electronically Signed   By: Jeronimo Greaves M.D.   On: 06/01/2015 20:58   Dg Chest Portable 1 View  06/23/2015  CLINICAL DATA:  Atrial fibrillation. EXAM: PORTABLE CHEST 1 VIEW COMPARISON:  06/17/2015 FINDINGS: Heart size and  pulmonary vascularity are normal and the lungs are clear. Calcification in the arch of the aorta. No acute osseous abnormalities. IMPRESSION: No acute disease.  Aortic atherosclerosis. Electronically Signed   By: Francene Boyers M.D.   On: 06/23/2015 14:13   Ir Kypho Vertebral Lumbar Augmentation  06/23/2015  CLINICAL DATA:  L2 compression fracture. Afebrile. No evidence of leukocytosis. Negative blood culture. EXAM: IR KYPHO VERTEBRAL LUMBAR AUGMENTATION FLUOROSCOPY TIME:  6 minutes and 48 seconds MEDICATIONS AND MEDICAL HISTORY: Versed 5 mg, Fentanyl 100 mcg. Additional Medications: . ANESTHESIA/SEDATION: Moderate sedation time: 30 minutes CONTRAST:  None PROCEDURE: The procedure, risks, benefits, and alternatives were explained to the patient. Questions regarding the procedure were encouraged and answered. The patient understands and consents to the procedure. The back was prepped with Betadine in a sterile fashion, and a sterile drape was applied covering the operative field. A sterile gown and sterile gloves were used for the procedure. 1% lidocaine was utilized for local anesthesia. Under fluoroscopic guidance, T10 gauge expressed needle was advanced into the L2 vertebral body at via right pedicle approach. A drill was advanced into the vertebral body. The 15 2 balloon was then advanced into the vertebral body and insufflated to 100 PSI. The balloon was removed and cement was injected utilizing the deposition trocar and cement delivery system. Contrast filled the cavity as well as the vertebral body, crossing the midline, and extending into the superior endplate. The cement trocar and outer expressed needle within removed. FINDINGS: Images document L2 kyphoplasty via right pedicle pressure. Cement fills the vertebral body, crossing the midline, and extending from the inferior to superior endplate. There is no extraosseous cement. COMPLICATIONS: None IMPRESSION: Successful L2 kyphoplasty. Electronically Signed    By: Jolaine Click M.D.   On: 06/23/2015 11:10   Ir Radiologist Eval & Mgmt  06/17/2015  EXAM: ESTABLISHED PATIENT OFFICE VISIT CHIEF COMPLAINT: Back pain HISTORY OF PRESENT ILLNESS: Patient is a 82 year old female who had a fall May 29, 2015 with lower back pain after her fall. She is on high doses of pain medication, which has been unsuccessful with controlling her pain. CT scan performed 06/01/2015 demonstrates L2 compression fracture. She has been previously treated for L1 compression fracture 2014 with excellent relief of symptoms. She denies fevers, rigors, chills. Lab values do demonstrate leukocytosis, of uncertain origin. PHYSICAL EXAMINATION: Focal back pain centered over the L2 region, confirmed with fluoroscopic guidance. ASSESSMENT AND PLAN: Given the patient's leukocytosis, we will investigate with urinalysis and a chest x-ray. I think best practice  will be to delay until we are certain there is no infectious source. I discussed decreasing her risk with the leg until next week with the family and the patient, who are in agreement with the strategy of risk reduction. We will order urinalysis and a chest x-ray today. Hopefully we can treat her within a week. Signed, Yvone Neu. Loreta Ave, DO [facility] Electronically Signed   By: Gilmer Mor D.O.   On: 06/17/2015 11:38    ECG  afib with RVR  Echocardiogram 05/19/2015  LV EF: 55% -  60%  ------------------------------------------------------------------- Indications:   I35.0 Aortic Stenosis.  ------------------------------------------------------------------- History:  PMH: Acquired from the patient and from the patient&'s chart. Risk factors: Hypertension. Hypercholesterolemia.  ------------------------------------------------------------------- Study Conclusions  - Left ventricle: The cavity size was normal. There was mild concentric hypertrophy. Systolic  function was normal. The estimated ejection fraction was in the range of 55% to 60%. Doppler parameters are consistent with abnormal left ventricular relaxation (grade 1 diastolic dysfunction). - Aortic valve: There was moderate stenosis. There was mild regurgitation. - Mitral valve: Severe annular calcification There was mild regurgitation. - Left atrium: The atrium was moderately dilated. - Atrial septum: Aneurysmal with no obvious PFO but poorly visualized on sub costal images    ASSESSMENT AND PLAN  1. PAF with uncertain duration  - per family it is likely started this morning, however after discussing with radiology and short stay staff, unable to confirm onset of afib this morning. Unfortunately patient has no cardiac awareness.  - CHA2DS2-Vasc score 4 (age, female, HTN)  - will admit to cardiology, will focus on rate control, stop home BP medication, start PO diltiazem to control HR. Will decide systemic anticoagulation later, will need to check with interventional radiology regarding when can start systemic anticoagulation after kyphoplasty, and it is also depend on her bleeding risk   2. Lumbar fx s/p successful L2 kyphoplasty today  3. Possible severe AS by symptom with only moderate degree AS on echo  - planning for outpatient L&RHC on 1/5, procedure cancelled due to recent compression fracture  - per Dr. Royann Shivers, not a conventional open surgery candidate, TAVR likely will not offer much survival benefit, but likely will offer symptomatic relief  4. Hypertension 5. hyperlipidemia   Signed, Azalee Course, PA-C 06/23/2015, 2:46 PM

## 2015-06-23 NOTE — Progress Notes (Signed)
Per Dr Bonnielee HaffHoss take client to ER

## 2015-06-24 ENCOUNTER — Ambulatory Visit (HOSPITAL_COMMUNITY): Admission: RE | Admit: 2015-06-24 | Payer: Medicare Other | Source: Ambulatory Visit | Admitting: Cardiovascular Disease

## 2015-06-24 ENCOUNTER — Encounter (HOSPITAL_COMMUNITY): Admission: RE | Payer: Self-pay | Source: Ambulatory Visit

## 2015-06-24 SURGERY — RIGHT/LEFT HEART CATH AND CORONARY ANGIOGRAPHY
Anesthesia: LOCAL

## 2015-06-24 NOTE — Telephone Encounter (Signed)
[facility].  Patient understands to follow up with provider Norma FredricksonLori Gerhardt NP on 06/28/2015 at 11:00a [facility] Patient understands discharge instructions? yes Patient understands medications and regiment? yes Patient understands to bring all medications to this visit? yes

## 2015-06-28 ENCOUNTER — Encounter: Payer: Medicare Other | Admitting: Nurse Practitioner

## 2015-07-02 ENCOUNTER — Encounter: Payer: Self-pay | Admitting: Cardiology

## 2015-07-02 ENCOUNTER — Ambulatory Visit (INDEPENDENT_AMBULATORY_CARE_PROVIDER_SITE_OTHER): Payer: Medicare Other

## 2015-07-02 ENCOUNTER — Ambulatory Visit (INDEPENDENT_AMBULATORY_CARE_PROVIDER_SITE_OTHER): Payer: Medicare Other | Admitting: Cardiology

## 2015-07-02 VITALS — BP 166/78 | HR 89 | Ht 60.0 in | Wt 110.0 lb

## 2015-07-02 DIAGNOSIS — Z7901 Long term (current) use of anticoagulants: Secondary | ICD-10-CM

## 2015-07-02 DIAGNOSIS — R000 Tachycardia, unspecified: Secondary | ICD-10-CM | POA: Diagnosis not present

## 2015-07-02 DIAGNOSIS — I480 Paroxysmal atrial fibrillation: Secondary | ICD-10-CM | POA: Diagnosis not present

## 2015-07-02 MED ORDER — APIXABAN 2.5 MG PO TABS: 2.5000 mg | ORAL_TABLET | Freq: Two times a day (BID) | ORAL | Status: AC

## 2015-07-02 MED ORDER — DILTIAZEM HCL 120 MG PO TABS: 120.0000 mg | ORAL_TABLET | Freq: Every day | ORAL | Status: AC

## 2015-07-02 NOTE — Progress Notes (Signed)
07/02/2015 Alyssa Cummings   15-Mar-1918  [id number]  Primary Physician Neldon Labella, MD Primary Cardiologist: Dr. Vickii Chafe    Reason for Visit/CC: F/u for Atrial Fibrillation; Aortic Stenosis.   HPI:  82 y/o female, followed by Dr. Royann Shivers, with a h/o aortic stenosis, HTN and HLD. Her most recent echo 05/19/2015 showed normal LV systolic function with an EF of 55-60%, G1 DD, mild AI, moderate AS, mild MR and a mildly dilated LA. AVA was 0.62 cm2, mean gradient was 25 mm Hg and peak gradient was 37 mm Hg. The aorta was normal in appearance. Dr. Royann Shivers reviewed the study and the following was noted on results report "I think there has indeed been progression of the aortic stenosis (the study quality was not as good as the year before and gradients across the valve are clearly underestimated). There is definite worsening of the aortic insufficiency (valve leak). Best way to clarify the true severity of AS would be a right and left heart cath. However, that would only make sense if she would be willing to go ahead with TAVR (stent valve). Otherwise, invasive testing would not make a lot of sense".   She initally agreed to proceed with further testing. She was scheduled for a R/LHC on 06/24/15, however this was canceled after a recent fall that resulted in a lumbar fracture. On 06/23/15, she presented for a L2 Kyphoplasty. Post procedure, she was noted to be in atrial fibrillation w/ RVR. This was a new diagnosis. She was sent to the ED, where she had spontaneous conversion back to NSR. TSH was normal. Enzymes were negative.  She was seen by Dr. Antoine Poche. He decided to stop Norvasc and reduce her lisinopril to 20 mg daily. He suggested adding Cardizem 30 mg Q6H. She was also advised to f/u in [facility] for repeat assesment. Per Dr. [place], "At that point (time of f/u) I would suggest Eliquis and stopping ASA (I will hold off the Eliquis today because of the kyphoplasty). I discussed at length the  risk and benefit with the patient and family. I would then follow up with a 48 Holter to judge rate control before converting to Cardizem CD. (Of note her family is thinking of not persuing further TAVR work up.)".  The patient presents to clinic today for f/u. She is accompanied by her Daughter and nursing assistant from her independent living facility. She reports that she is doing well, even better that she had anticipated. She denies any palpiations, dyspnea, CP, dizziness, sycnope/ near syncope. She denies any recurrent falls. She has recovered well from her recent procedure. She still has mild occasional LBP, but nothing severe. Physical exam reveals RRR with 3/6 AS murmur.  She and her family remain in agreement to start Eliquis. They wish to hold off with plans for [facility] for now. They plan to revisit the idea in February during her f/u visit with Dr. Royann Shivers.    Current Outpatient Prescriptions  Medication Sig Dispense Refill  . acetaminophen (TYLENOL) 650 MG CR tablet Take 650 mg by mouth every 6 (six) hours as needed for pain.    Marland Kitchen alendronate (FOSAMAX) 70 MG tablet Take 70 mg by mouth every 7 (seven) days. Take with a full glass of water on an empty stomach. Take on Saturdays    . amitriptyline (ELAVIL) 50 MG tablet Take 50 mg by mouth at bedtime.    Marland Kitchen aspirin EC 81 MG EC tablet Take 1 tablet (81 mg total) by mouth daily.    Marland Kitchen  atorvastatin (LIPITOR) 40 MG tablet Take 40 mg by mouth at bedtime.     . carvedilol (COREG) 3.125 MG tablet Take 1 tablet (3.125 mg total) by mouth 2 (two) times daily with a meal. 180 tablet 2  . diltiazem (CARDIZEM) 30 MG tablet Take 1 tablet (30 mg total) by mouth every 6 (six) hours. 120 tablet 0  . docusate sodium (COLACE) 100 MG capsule Take 100 mg by mouth at bedtime.     Marland Kitchen. HYDROcodone-acetaminophen (HYCET) 7.5-325 mg/15 ml solution Take 5 mLs by mouth every 4 (four) hours as needed.  0  . lansoprazole (PREVACID) 30 MG capsule Take 30 mg by mouth every  other day.     . latanoprost (XALATAN) 0.005 % ophthalmic solution Place 1 drop into both eyes at bedtime.    . lidocaine (LIDODERM) 5 % Place 1 patch onto the skin daily. Apply on back, Remove & Discard patch within 12 hours or as directed by MD 30 patch 0  . lisinopril (PRINIVIL,ZESTRIL) 20 MG tablet Take 20 mg by mouth daily.    . methocarbamol (ROBAXIN) 500 MG tablet Take 1 tablet (500 mg total) by mouth every 6 (six) hours as needed for muscle spasms. 30 tablet 0  . naproxen (NAPROSYN) 500 MG tablet Take 1 tablet (500 mg total) by mouth 3 (three) times daily with meals. 21 tablet 0  . polyethylene glycol (MIRALAX / GLYCOLAX) packet Take 17 g by mouth daily as needed for mild constipation. 14 each 0  . tolterodine (DETROL LA) 2 MG 24 hr capsule Take 2 mg by mouth every morning.    . traMADol (ULTRAM) 50 MG tablet Take 0.5 tablets (25 mg total) by mouth every 6 (six) hours as needed for severe pain. 30 tablet 0   No current facility-administered medications for this visit.    Allergies  Allergen Reactions  . Codeine Nausea And Vomiting    Social History   Social History  . Marital Status: Widowed    Spouse Name: [name]  . Number of Children: [name]  . Years of Education: [name]   Occupational History  . Not on file.   Social History Main Topics  . Smoking status: Never Smoker   . Smokeless tobacco: Not on file  . Alcohol Use: No  . Drug Use: No  . Sexual Activity: Not on file   Other Topics Concern  . Not on file   Social History Narrative     Review of Systems: General: negative for chills, fever, night sweats or weight changes.  Cardiovascular: negative for chest pain, dyspnea on exertion, edema, orthopnea, palpitations, paroxysmal nocturnal dyspnea or shortness of breath Dermatological: negative for rash Respiratory: negative for cough or wheezing Urologic: negative for hematuria Abdominal: negative for nausea, vomiting, diarrhea, bright red blood per rectum, melena, or  hematemesis Neurologic: negative for visual changes, syncope, or dizziness All other systems reviewed and are otherwise negative except as noted above.    Blood pressure 166/78, pulse 89, height 5' (1.524 m), weight 110 lb (49.896 kg).  General appearance: alert, cooperative, no distress and frail appearing, ambulating with walker Neck: no carotid bruit and no JVD Lungs: clear to auscultation bilaterally Heart: regular rate and rhythm and 3/6 SM Extremities: no LEE Pulses: 2+ and symmetric Skin: warm and dry Neurologic: Grossly normal  ASSESSMENT AND PLAN:   1. PAF: Patient had spontaneous conversion back to NSR while in the ED 06/23/15. She denies any symptoms of breakthrough atrial fibrillation since that time. Physical exam  reveals RRR. Pulse rate is in the 80s. She has tolerated addition of Cardizem well. Per Dr. Antoine Poche, we will plan for a 48 Hr holter monitor to assess HR response prior to converting to Cardizem CD. If stable, will change to 120 mg daily. Also as recommended by Dr. Antoine Poche, we will transition to Eliquis today, given an elevated CHA2DS2 VASc score of 40 (age >72, female and HTN). He discussed the risk and benefits with the patient and family and all agree to start anticoagulation. Given age >1 and weight <60 kg, we will treat with the low dose of 2.5 mg BID. Samples + a free 30 day supply card given. We will d/c ASA to reduce bleed risk. Recent CBC 06/23/15 showed stable Hgb at 12.7. Recommend f/u CBC at time of f/u with Dr. Royann Shivers on 07/21/15. Patient and nursing assistant instructed to notify our office of any abnormal bleeding and to seek emergency medical evaluation if any falls resulting in head trauma. They both verbalized understanding.   2. Aortic Stenosis: Felt to be moderate-severe. Patient appears to be fairly stable, denying CP, dyspnea, syncope/ near syncope. No s/s of acute CHF. Given her recent set backs (recent fall, surgery + new onset atrial fibrillation),  she wishes to hold off on plans for [facility] at this time. She has f/u with Dr. Royann Shivers on 07/21/15 and plans to further discuss/ revisit the idea at that time.   3. HTN: her BP is a bit on the higher side currently in the 160s systolic, however she is due for another dose of Cardizem (scheduled Q6H).    PLAN  F/u in 2 weeks with Dr. Royann Shivers.   Robbie Lis PA-C 07/02/2015 1:56 PM

## 2015-07-02 NOTE — Patient Instructions (Addendum)
Medication Instructions:   STOP TAKING ASPIRIN  START ELIQUIS 2.5 MG TWICE A DAY   RESUME TAKING CARDIZEM 30 MG  UNITL.....YOU HAVE HEARD FROM CLINC ABOUT MONITOR RESULTS ........THEN  YOU  MAY START TAKING CARDIZEM 120 MG ONCE A DAY     If you need a refill on your cardiac medications before your next appointment, please call your pharmacy.  Labwork: NONE ORDER TODAY    Testing/Procedures: Your physician has recommended that you wear a holter monitor. Holter monitors are medical devices that record the heart's electrical activity. Doctors most often use these monitors to diagnose arrhythmias. Arrhythmias are problems with the speed or rhythm of the heartbeat. The monitor is a small, portable device. You can wear one while you do your normal daily activities. This is usually used to diagnose what is causing palpitations/syncope (passing out).     Follow-Up: AS ALREADY SCHEDULED   Any Other Special Instructions Will Be Listed Below (If Applicable).

## 2015-07-12 ENCOUNTER — Telehealth: Payer: Self-pay | Admitting: Cardiology

## 2015-07-12 NOTE — Telephone Encounter (Signed)
New message ° ° ° ° ° °Calling to get monitor results °

## 2015-07-12 NOTE — Telephone Encounter (Signed)
Spoke with Saundra Shelling, daughter (DPR on file). Informed her of results and to have pt keep appt with Dr. Royann Shivers next week. Daughter verbalized understanding and was in agreement with this plan.

## 2015-07-21 ENCOUNTER — Encounter: Payer: Self-pay | Admitting: Cardiovascular Disease

## 2015-07-21 ENCOUNTER — Ambulatory Visit (INDEPENDENT_AMBULATORY_CARE_PROVIDER_SITE_OTHER): Payer: Medicare Other | Admitting: Cardiovascular Disease

## 2015-07-21 VITALS — BP 184/70 | HR 92 | Ht 60.0 in | Wt 105.0 lb

## 2015-07-21 DIAGNOSIS — I10 Essential (primary) hypertension: Secondary | ICD-10-CM | POA: Diagnosis not present

## 2015-07-21 DIAGNOSIS — Z7901 Long term (current) use of anticoagulants: Secondary | ICD-10-CM | POA: Diagnosis not present

## 2015-07-21 DIAGNOSIS — I480 Paroxysmal atrial fibrillation: Secondary | ICD-10-CM

## 2015-07-21 DIAGNOSIS — I350 Nonrheumatic aortic (valve) stenosis: Secondary | ICD-10-CM | POA: Diagnosis not present

## 2015-07-21 NOTE — Patient Instructions (Signed)
Dr. Croitoru recommends that you schedule a follow-up appointment in: 6 MONTHS   

## 2015-07-22 MED ORDER — CARVEDILOL 3.125 MG PO TABS: 3.1250 mg | ORAL_TABLET | Freq: Two times a day (BID) | ORAL | Status: AC

## 2015-07-22 NOTE — Progress Notes (Signed)
Patient ID: Alyssa Cummings, female   DOB: 25-Dec-1917, 82 y.o.   MRN: [id number]     Cardiology Office Note    Date:  07/22/2015   ID:  Alyssa Cummings, DOB Sep 22, 1917, MRN [id number]  PCP:  Neldon Labella, MD  Cardiologist:  Thurmon Fair, MD   Chief Complaint  Patient presents with  . Follow-up    no chest pain, no shortness of breath, no swelling, no cramping, no dizziness or lightheadedness    History of Present Illness:  Alyssa Cummings is a 82 y.o. female with at least moderate aortic stenosis who returns for follow-up. She was complaining of worsening dyspnea on exertion and we were planning workup for her aortic stenosis, when plans were put on hold due to a fall and lumbar compression fracture. Now that she is receiving physical therapy and is exercising frequently she actually feels that her breathing is improving. She has preserved left ventricular systolic function. Roughly one month ago she had atrial fibrillation rapid ventricular response. She was started on anticoagulation and has not had any problems with bleeding.  Overall she feels that she is actually doing much better. She denies syncope, palpitations, edema, dyspnea with usual activity or angina pectoris. Her blood pressure today is elevated but when checked during physical therapy it never exceeds the 140s.  Past Medical History  Diagnosis Date  . Hypertension   . High cholesterol   . Back pain   . Macular degeneration   . GERD (gastroesophageal reflux disease)   . OA (osteoarthritis)   . Osteoporosis   . DJD (degenerative joint disease) of cervical spine   . Moderate aortic stenosis     a. 07/2013 Echo: EF 60-65%, Gr 1 DD, mild AI, mod AS, tirv MR, mildly dil LA.  . Falls     a. uses walker, PT 3x/wk. Last fall 03/2014.    Past Surgical History  Procedure Laterality Date  . Cholecystectomy    . Back surgery    . Breast surgery      Outpatient Prescriptions Prior to Visit  Medication Sig Dispense Refill   . acetaminophen (TYLENOL) 650 MG CR tablet Take 650 mg by mouth every 6 (six) hours as needed for pain.    Marland Kitchen alendronate (FOSAMAX) 70 MG tablet Take 70 mg by mouth every 7 (seven) days. Take with a full glass of water on an empty stomach. Take on Saturdays    . amitriptyline (ELAVIL) 50 MG tablet Take 50 mg by mouth at bedtime.    Marland Kitchen apixaban (ELIQUIS) 2.5 MG TABS tablet Take 1 tablet (2.5 mg total) by mouth 2 (two) times daily. 60 tablet 5  . atorvastatin (LIPITOR) 40 MG tablet Take 40 mg by mouth at bedtime.     . carvedilol (COREG) 3.125 MG tablet Take 1 tablet (3.125 mg total) by mouth 2 (two) times daily with a meal. 180 tablet 2  . diltiazem (CARDIZEM) 120 MG tablet Take 1 tablet (120 mg total) by mouth daily. 30 tablet 6  . docusate sodium (COLACE) 100 MG capsule Take 100 mg by mouth at bedtime.     . lansoprazole (PREVACID) 30 MG capsule Take 30 mg by mouth every other day.     . latanoprost (XALATAN) 0.005 % ophthalmic solution Place 1 drop into both eyes at bedtime.    Marland Kitchen lisinopril (PRINIVIL,ZESTRIL) 20 MG tablet Take 20 mg by mouth daily.    . polyethylene glycol (MIRALAX / GLYCOLAX) packet Take 17 g by mouth daily as needed for mild  constipation. 14 each 0  . tolterodine (DETROL LA) 2 MG 24 hr capsule Take 2 mg by mouth every morning.    Marland Kitchen HYDROcodone-acetaminophen (HYCET) 7.5-325 mg/15 ml solution Take 5 mLs by mouth every 4 (four) hours as needed.  0  . lidocaine (LIDODERM) 5 % Place 1 patch onto the skin daily. Apply on back, Remove & Discard patch within 12 hours or as directed by MD 30 patch 0  . methocarbamol (ROBAXIN) 500 MG tablet Take 1 tablet (500 mg total) by mouth every 6 (six) hours as needed for muscle spasms. 30 tablet 0  . naproxen (NAPROSYN) 500 MG tablet Take 1 tablet (500 mg total) by mouth 3 (three) times daily with meals. 21 tablet 0  . traMADol (ULTRAM) 50 MG tablet Take 0.5 tablets (25 mg total) by mouth every 6 (six) hours as needed for severe pain. 30 tablet 0     No facility-administered medications prior to visit.     Allergies:   Codeine   Social History   Social History  . Marital Status: Widowed    Spouse Name: [name]  . Number of Children: [name]  . Years of Education: [name]   Social History Main Topics  . Smoking status: Never Smoker   . Smokeless tobacco: None  . Alcohol Use: No  . Drug Use: No  . Sexual Activity: Not Asked   Other Topics Concern  . None   Social History Narrative     Family History:  The patient's family history includes Heart attack in her father and mother.   ROS:   Please see the history of present illness.    ROS All other systems reviewed and are negative.   PHYSICAL EXAM:   VS:  BP 184/70 mmHg  Pulse 92  Ht 5' (1.524 m)  Wt 47.628 kg (105 lb)  BMI 20.51 kg/m2   GEN: Well nourished, well developed, in no acute distress, very slender and somewhat frail-appearing HEENT: normal Neck: no JVD, carotid bruits, or masses Cardiac: RRR; early to mid peaking 3/6 aortic ejection murmur, 1/6 decrescendo diastolic murmur at the right lower sternal border no rubs, or gallops,no edema  Respiratory:  clear to auscultation bilaterally, normal work of breathing GI: soft, nontender, nondistended, + BS MS: no deformity or atrophy Skin: warm and dry, no rash Neuro:  Alert and Oriented x 3, Strength and sensation are intact Psych: euthymic mood, full affect  Wt Readings from Last 3 Encounters:  07/21/15 47.628 kg (105 lb)  07/02/15 49.896 kg (110 lb)  06/23/15 49.896 kg (110 lb)      Studies/Labs Reviewed:   EKG:  EKG is not ordered today.  Recent Labs: 06/02/2015: TSH 0.939 06/03/2015: ALT 37 06/23/2015: BUN 12; Creatinine, Ser 0.60; Hemoglobin 12.7; Platelets 246; Potassium 3.9; Sodium 140   Lipid Panel    Component Value Date/Time   CHOL 177 06/23/2015 0824   TRIG 137 06/23/2015 0824   HDL 54 06/23/2015 0824   CHOLHDL 3.3 06/23/2015 0824   VLDL 27 06/23/2015 0824   LDLCALC 96 06/23/2015 0824      ASSESSMENT:    1. Moderate aortic stenosis   2. PAF (paroxysmal atrial fibrillation) (HCC)   3. Essential hypertension   4. Chronic anticoagulation - Eliquis started 07/02/15 for PAF + chadsvasc score of 4 (age, sex, HTN).      PLAN:  In order of problems listed above:  1. Aortic stenosis: The fact that her exertional dyspnea seems to be improving as she does  physical therapy suggests that some of her complaints were related to deconditioning rather than aortic stenosis. Workup for TAVR no longer appears has urgent. Of course, she may not be a candidate for have her due to her small body size and likely very small femoral arteries and small aortic annulus. She is not entirely convinced that she would once to have any invasive procedures, anyway. For the time being will put additional valvular workup on hold, but I did caution them that the workup for possible TAVR is lengthy. Asked her to promptly report deterioration in exertional tolerance, chest discomfort, dizziness or syncope 2. Atrial fibrillation: No new clinically evident events. Tolerating anticoagulation without complications. CHA2DS2 VASc score of 4 (age 47, gender and HTN).  3. HTN: Blood pressure higher today than usual for her. She has her blood pressure checked frequently when she has physical therapy. 4. Obviously, bleeding risk is also elevated due to her age and she has had a recent fall. The risk/benefit balance of anticoagulation needs to be periodically reevaluated    Medication Adjustments/Labs and Tests Ordered: Current medicines are reviewed at length with the patient today.  Concerns regarding medicines are outlined above.  Medication changes, Labs and Tests ordered today are listed in the Patient Instructions below. Patient Instructions  Dr. Royann Shivers recommends that you schedule a follow-up appointment in: 6 MONTHS        Signed, Yoshiye Kraft, MD  07/22/2015 9:28 AM    [facility]  [facility] [address] [place], [place], [place]  [address] Phone: [phone]; Fax: [phone]

## 2015-07-26 ENCOUNTER — Telehealth: Payer: Self-pay | Admitting: Cardiovascular Disease

## 2015-07-26 NOTE — Telephone Encounter (Signed)
If she is taking lisinopril and diltiazem in mornings, have her switch one (doesn't matter which) to evenings.  Give it about 4-5 days and see if BP readings pre- morning meds improves

## 2015-07-26 NOTE — Telephone Encounter (Signed)
Med recommendations relayed to caller. She will start the lisinopril at night and note if improvement.

## 2015-07-26 NOTE — Telephone Encounter (Signed)
Alyssa Cummings is calling to report high blood pressures in the morning when she wakes up in the range of  200/100. Please call   Thanks

## 2015-07-26 NOTE — Telephone Encounter (Signed)
Returned call to [employer]. She states pt's morning BPs prior to meds have been around 200/100 for past 3 days. She states pt has c/o not feeling well when first getting up.  Notes taking meds around 9am, pt usually is rechecked after an hour & BP then 130/80s.  Affirms meds as listed -- though she thought pt was taking the extended release capsule for cardizem, not tablet. Other meds verified as accurate. She is aare I will defer for advice on dosing.

## 2015-09-23 ENCOUNTER — Telehealth: Payer: Self-pay | Admitting: Cardiovascular Disease

## 2015-09-23 NOTE — Telephone Encounter (Signed)
New Message  Pt dtr calling to discuss pt's meds. Please call back and discuss.

## 2015-09-23 NOTE — Telephone Encounter (Signed)
LMTCB 4/6

## 2015-09-24 NOTE — Telephone Encounter (Signed)
Returned daughter's call. [facility] - Needed current med list faxed for continuation of care. This was sent to fax number requested.

## 2015-09-24 NOTE — Telephone Encounter (Signed)
Follow up   Pt daughter is calling for rn  to discuss pt's meds. Please call back and discuss.

## 2015-11-27 ENCOUNTER — Emergency Department: Admit: 2015-11-28 | Payer: MEDICARE | Primary: Family Medicine

## 2015-11-27 ENCOUNTER — Inpatient Hospital Stay
Admit: 2015-11-27 | Discharge: 2015-12-01 | Disposition: A | Discharge status: Skilled Nursing Facility | Payer: MEDICARE | Attending: Internal Medicine | Admitting: Internal Medicine

## 2015-11-27 DIAGNOSIS — K5732 Diverticulitis of large intestine without perforation or abscess without bleeding: Principal | ICD-10-CM

## 2015-11-27 LAB — CBC WITH AUTOMATED DIFF
ABS. BASOPHILS: 0 10*3/uL (ref 0.0–0.1)
ABS. EOSINOPHILS: 0.1 10*3/uL (ref 0.0–0.4)
ABS. LYMPHOCYTES: 1.3 10*3/uL (ref 0.8–3.5)
ABS. MONOCYTES: 0.7 10*3/uL (ref 0.0–1.0)
ABS. NEUTROPHILS: 5.6 10*3/uL (ref 1.8–8.0)
BASOPHILS: 0 % (ref 0–1)
EOSINOPHILS: 2 % (ref 0–7)
HCT: 38.1 % (ref 35.0–47.0)
HGB: 12.2 g/dL (ref 11.5–16.0)
LYMPHOCYTES: 17 % (ref 12–49)
MCH: 29.2 PG (ref 26.0–34.0)
MCHC: 32 g/dL (ref 30.0–36.5)
MCV: 91.1 FL (ref 80.0–99.0)
MONOCYTES: 9 % (ref 5–13)
NEUTROPHILS: 72 % (ref 32–75)
PLATELET: 190 10*3/uL (ref 150–400)
RBC: 4.18 M/uL (ref 3.80–5.20)
RDW: 13.6 % (ref 11.5–14.5)
WBC: 7.8 10*3/uL (ref 3.6–11.0)

## 2015-11-27 MED ORDER — MORPHINE 2 MG/ML INJECTION
2 mg/mL | INTRAMUSCULAR | Status: AC
Start: 2015-11-27 — End: 2015-11-27
  Administered 2015-11-27: via INTRAVENOUS

## 2015-11-27 MED FILL — MORPHINE 2 MG/ML INJECTION: 2 mg/mL | INTRAMUSCULAR | Qty: 1

## 2015-11-27 NOTE — ED Triage Notes (Signed)
Pt presents with complaints of thoracic back pain that began on Thursday. Pt states that pain worsens with movement. Pt reports pain in upper back when sitting up in bed and on palpation

## 2015-11-27 NOTE — ED Provider Notes (Signed)
HPI Comments: [name] is a 82 y.o. female with Hx of compression fracture, dementia, AF who presents via EMS to [facility] ED with cc of back pain. Pt was referred from [place] [facility] to [facility] ED for "admission for intractable back pain."  Per pt son, pt has experienced thoracic back pain without hx of fall/ injury since Thursday of this past week. Pt [facility] at [facility] and  just moved to [place] from [place] in the last 2 mos to be closer to her son who resides in [place]. Son notes hx of compression fractures in the past with Admit 05/2015 in [place] for intractable back pain. Pt states the pain worse with any movement and progressive to pt cannot walk . Pt normally uses assist device and her son has been working on obtaining PT 2/2 weakness. Hx L1 compression fx and son notes other compression fx as well. Pt has also c/o lower abdominal pain which radiates to her back as well. She has no urinary/fecal incontinence. Taken Tylenol only for pain. No problems eating/drinkin. She has no hx of aortic aneursym/ AAA. NO recent fevers/ chills/ cough/ SOB/ CP.           PCP: Oliver Hum, MD    There are no other complaints, changes or physical findings at this time.          The history is provided by the patient, the EMS personnel and a relative.        Past Medical History:   Diagnosis Date   ??? CAD (coronary artery disease)    ??? Dementia    ??? Hypertension    ??? Ill-defined condition     a fib   ??? Ill-defined condition     macular degeneration   ??? Ill-defined condition     osteoporosis   ??? Ill-defined condition     GERD   ??? Ill-defined condition     hypercholesterolemia       Past Surgical History:   Procedure Laterality Date   ??? HX ORTHOPAEDIC           History reviewed. No pertinent family history.    Social History     Social History   ??? Marital status: WIDOWED     Spouse name: [name]   ??? Number of children: [name]   ??? Years of education: [name]     Occupational History   ??? Not on file.      Social History Main Topics   ??? Smoking status: Not on file   ??? Smokeless tobacco: Not on file   ??? Alcohol use Not on file   ??? Drug use: Not on file   ??? Sexual activity: Not on file     Other Topics Concern   ??? Not on file     Social History Narrative   ??? No narrative on file         ALLERGIES: Codeine    Review of Systems   Constitutional: Positive for activity change. Negative for appetite change and chills.   HENT: Negative for congestion, rhinorrhea, sinus pressure, sneezing and sore throat.    Eyes: Negative for pain, discharge and visual disturbance.   Respiratory: Negative for cough and shortness of breath.    Gastrointestinal: Negative for diarrhea, nausea and vomiting.   Genitourinary: Negative for flank pain, frequency and urgency.   Musculoskeletal: Negative for arthralgias, gait problem, joint swelling, myalgias and neck pain.   Skin: Negative for color  change and rash.   Neurological: Negative for dizziness, speech difficulty and light-headedness.   Psychiatric/Behavioral: Negative for agitation, behavioral problems and confusion.   All other systems reviewed and are negative.      Vitals:    11/27/15 1910   BP: 161/90   Pulse: 69   Resp: 18   Temp: 98.3 ??F (36.8 ??C)   SpO2: 96%   Weight: 49.9 kg (110 lb)   Height: 5' (1.524 m)            Physical Exam   Constitutional: She appears well-developed and well-nourished. She appears distressed (pain distress ).   Confused   HOH      HENT:   Head: Normocephalic and atraumatic.   Right Ear: External ear normal.   Left Ear: External ear normal.   Nose: Nose normal.   Mouth/Throat: Oropharynx is clear and moist. No oropharyngeal exudate.   Eyes: Conjunctivae and EOM are normal. Pupils are equal, round, and reactive to light.   Neck: Normal range of motion. Neck supple.   Cardiovascular: Normal rate, regular rhythm, normal heart sounds and intact distal pulses.    Pulmonary/Chest: Effort normal and breath sounds normal.    Abdominal: Soft. Bowel sounds are normal. There is no tenderness. There is no rebound and no guarding.   Musculoskeletal: Normal range of motion.   There are no step offs, deformities on palpation of cervical through lumbar spine.   There is no para-spinal musculoskeletal TTP or tension noted.   (+) thoracic midline TTP      Neurological: She is alert.   Skin: Skin is warm and dry.   Psychiatric: She has a normal mood and affect. Her behavior is normal. Judgment and thought content normal.   Nursing note and vitals reviewed.       MDM  Number of Diagnoses or Management Options  Cannot walk:   Dementia without behavioral disturbance, unspecified dementia type:   Diverticulitis of small intestine without perforation or abscess without bleeding:   Intractable back pain:      Amount and/or Complexity of Data Reviewed  Clinical lab tests: ordered and reviewed  Tests in the radiology section of CPT??: ordered and reviewed  Review and summarize past medical records: yes  Discuss the patient with other providers: yes      ED Course       Procedures    LABORATORY TESTS:  Recent Results (from the past 12 hour(s))   CBC WITH AUTOMATED DIFF    Collection Time: 11/27/15  7:40 PM   Result Value Ref Range    WBC 7.8 3.6 - 11.0 K/uL    RBC 4.18 3.80 - 5.20 M/uL    HGB 12.2 11.5 - 16.0 g/dL    HCT 38.1 35.0 - 47.0 %    MCV 91.1 80.0 - 99.0 FL    MCH 29.2 26.0 - 34.0 PG    MCHC 32.0 30.0 - 36.5 g/dL    RDW 13.6 11.5 - 14.5 %    PLATELET 190 150 - 400 K/uL    NEUTROPHILS 72 32 - 75 %    LYMPHOCYTES 17 12 - 49 %    MONOCYTES 9 5 - 13 %    EOSINOPHILS 2 0 - 7 %    BASOPHILS 0 0 - 1 %    ABS. NEUTROPHILS 5.6 1.8 - 8.0 K/UL    ABS. LYMPHOCYTES 1.3 0.8 - 3.5 K/UL    ABS. MONOCYTES 0.7 0.0 - 1.0 K/UL  ABS. EOSINOPHILS 0.1 0.0 - 0.4 K/UL    ABS. BASOPHILS 0.0 0.0 - 0.1 K/UL   METABOLIC PANEL, COMPREHENSIVE    Collection Time: 11/27/15  7:40 PM   Result Value Ref Range    Sodium 141 136 - 145 mmol/L    Potassium 4.7 3.5 - 5.1 mmol/L     Chloride 109 (H) 97 - 108 mmol/L    CO2 23 21 - 32 mmol/L    Anion gap 9 5 - 15 mmol/L    Glucose 88 65 - 100 mg/dL    BUN 19 6 - 20 MG/DL    Creatinine 0.80 0.55 - 1.02 MG/DL    BUN/Creatinine ratio 24 (H) 12 - 20      GFR est AA >60 >60 ml/min/1.61m    GFR est non-AA >60 >60 ml/min/1.77m   Calcium 8.6 8.5 - 10.1 MG/DL    Bilirubin, total 0.3 0.2 - 1.0 MG/DL    ALT (SGPT) 19 12 - 78 U/L    AST (SGOT) 28 15 - 37 U/L    Alk. phosphatase 103 45 - 117 U/L    Protein, total 6.4 6.4 - 8.2 g/dL    Albumin 2.9 (L) 3.5 - 5.0 g/dL    Globulin 3.5 2.0 - 4.0 g/dL    A-G Ratio 0.8 (L) 1.1 - 2.2     URINALYSIS W/ RFLX MICROSCOPIC    Collection Time: 11/27/15 10:13 PM   Result Value Ref Range    Color YELLOW/STRAW      Appearance CLEAR CLEAR      Specific gravity 1.013 1.003 - 1.030      pH (UA) 6.0 5.0 - 8.0      Protein NEGATIVE  NEG mg/dL    Glucose NEGATIVE  NEG mg/dL    Ketone NEGATIVE  NEG mg/dL    Bilirubin NEGATIVE  NEG      Blood SMALL (A) NEG      Urobilinogen 0.2 0.2 - 1.0 EU/dL    Nitrites NEGATIVE  NEG      Leukocyte Esterase SMALL (A) NEG      WBC 5-10 0 - 4 /hpf    RBC 0-5 0 - 5 /hpf    Epithelial cells FEW FEW /lpf    Bacteria NEGATIVE  NEG /hpf    Hyaline cast 0-2 0 - 5 /lpf       IMAGING RESULTS:  CT ABD PELV W CONT   Final Result   INDICATION: Abdominal pain and back pain, dementia. Normal white blood cell  count. Normal liver enzymes.  ??  COMPARISON: None  ??  TECHNIQUE:   Following the uneventful intravenous administration of 100 cc Isovue-370, thin  axial images were obtained through the abdomen and pelvis. Coronal and sagittal  reconstructions were generated. Oral contrast was not administered. CT dose  reduction was achieved through use of a standardized protocol tailored for this  examination and automatic exposure control for dose modulation.  ??  FINDINGS:   LUNG BASES: Patchy atelectasis and interstitial lung disease.  INCIDENTALLY IMAGED HEART AND MEDIASTINUM: Normal cardiac size. Coronary artery   calcific atherosclerosis.  LIVER: No mass. Mild intrahepatic biliary dilatation.  GALLBLADDER: Surgically resected. CBD is dilated. No abrupt cut off.  SPLEEN: No mass.  PANCREAS: No mass. Ductal dilatation. No surrounding inflammation.  ADRENALS: Unremarkable.  KIDNEYS: Multiple bilateral renal cysts.  STOMACH: Unremarkable.  SMALL BOWEL: No dilatation or wall thickening.  COLON: Colonic diverticulosis. Mild mural thickening of the descending colon.  APPENDIX:  Unremarkable.  PERITONEUM: No ascites or pneumoperitoneum.  RETROPERITONEUM: No lymphadenopathy or aortic aneurysm.  REPRODUCTIVE ORGANS: Uterus is small. No adnexal mass.  URINARY BLADDER: No mass or calculus.  BONES: Chronic partial compression fractures of L1 and L2.  ADDITIONAL COMMENTS: [name]  ??  IMPRESSION  IMPRESSION:  ??  1. Mild descending colonic diverticulitis in the proper clinical setting.  2. Biliary dilatation is most likely chronic given the normal serum bilirubin.  3. No bowel obstruction.  ??   ??        CT SPINE THORAC WO CONT   Final Result   INDICATION: Increasing thoracic back pain for 2 days. No injury.   ??  COMPARISON: None.  ??  TECHNIQUE: Helical CT imaging of the thoracic spine was performed. Coronal and  sagittal reconstructions were obtained. CT dose reduction was achieved through  the use of a standardized protocol tailored for this examination and automatic  exposure control for dose modulation. Adaptive statistical iterative  reconstruction (ASIR) was utilized.  ??  CONTRAST: None  ??  FINDINGS:  ??  Rightward curvature of the thoracic spine is mild. No subluxation. No acute  fracture or compression deformity in the thoracic spine. Chronic partial  compression fractures of T12 and L1 are treated with methylmethacrylate.   ??  Right thyroid lobe heterogeneous cyst versus nodule is partially imaged but  measures at least 3 cm in diameter. Aorta is atherosclerotic.   ??   Mild central spinal canal stenosis secondary to T12 bony retropulsion. No other  stenosis in the thoracic spine.  ??  IMPRESSION  IMPRESSION:  ??  ??  1. No acute fracture.  2. T12 mild central spinal canal stenosis from bony retropulsion.  ??   ??   Imaging               IMPRESSION:  1. Diverticulitis of small intestine without perforation or abscess without bleeding    2. Intractable back pain    3. Cannot walk    4. Dementia without behavioral disturbance, unspecified dementia type        PLAN:    Admit Note:    Patient is being admitted to the hospital by [facility].  The results of their tests and reasons for their admission have been discussed with them and/or available family.  They convey agreement and understanding for the need to be admitted and for their admission diagnosis.  Consultation has been made with the inpatient physician specialist for hospitalization.  Donzetta Kohut, NP

## 2015-11-27 NOTE — ED Notes (Signed)
This pt has back pain which has been more severe over the past 3 days. I t is worse with movement, but unfortunately due to her dementia she cannot give adequate his regarding exact location and descriptive regarding the type of pain. There is no ant chest pain or sob but there has been some cough. There has been a 5 lb weight loss since xmas. I agree with the ct scans to eval compression fracture. She does not meet criteria for rehab services. Will likeu d/c her with lidoderm patch, tylenol and possible once daily voltaren (50mg  once daioy)

## 2015-11-27 NOTE — ED Notes (Signed)
Bedside report given to Erin, RN.  Pt currently in CT.

## 2015-11-27 NOTE — ED Notes (Signed)
Pt to CT via stretcher.

## 2015-11-28 LAB — URINALYSIS W/ RFLX MICROSCOPIC
Bacteria: NEGATIVE /hpf
Bilirubin: NEGATIVE
Glucose: NEGATIVE mg/dL
Ketone: NEGATIVE mg/dL
Nitrites: NEGATIVE
Protein: NEGATIVE mg/dL
Specific gravity: 1.013 (ref 1.003–1.030)
Urobilinogen: 0.2 EU/dL (ref 0.2–1.0)
pH (UA): 6 (ref 5.0–8.0)

## 2015-11-28 LAB — METABOLIC PANEL, COMPREHENSIVE
A-G Ratio: 0.8 — ABNORMAL LOW (ref 1.1–2.2)
ALT (SGPT): 19 U/L (ref 12–78)
AST (SGOT): 28 U/L (ref 15–37)
Albumin: 2.9 g/dL — ABNORMAL LOW (ref 3.5–5.0)
Alk. phosphatase: 103 U/L (ref 45–117)
Anion gap: 9 mmol/L (ref 5–15)
BUN/Creatinine ratio: 24 — ABNORMAL HIGH (ref 12–20)
BUN: 19 MG/DL (ref 6–20)
Bilirubin, total: 0.3 MG/DL (ref 0.2–1.0)
CO2: 23 mmol/L (ref 21–32)
Calcium: 8.6 MG/DL (ref 8.5–10.1)
Chloride: 109 mmol/L — ABNORMAL HIGH (ref 97–108)
Creatinine: 0.8 MG/DL (ref 0.55–1.02)
GFR est AA: >60 mL/min/{1.73_m2} (ref >60)
GFR est non-AA: >60 mL/min/{1.73_m2} (ref >60)
Globulin: 3.5 g/dL (ref 2.0–4.0)
Glucose: 88 mg/dL (ref 65–100)
Potassium: 4.7 mmol/L (ref 3.5–5.1)
Protein, total: 6.4 g/dL (ref 6.4–8.2)
Sodium: 141 mmol/L (ref 136–145)

## 2015-11-28 MED ORDER — MORPHINE 2 MG/ML INJECTION
2 mg/mL | INTRAMUSCULAR | Status: DC | PRN
Start: 2015-11-28 — End: 2015-12-01

## 2015-11-28 MED ORDER — ACETAMINOPHEN 500 MG TAB
500 mg | Freq: Four times a day (QID) | ORAL | Status: DC | PRN
Start: 2015-11-28 — End: 2015-12-01
  Administered 2015-11-30 – 2015-12-01 (×2): via ORAL

## 2015-11-28 MED ORDER — MORPHINE 2 MG/ML INJECTION
2 mg/mL | INTRAMUSCULAR | Status: AC
Start: 2015-11-28 — End: 2015-11-27
  Administered 2015-11-28: 01:00:00 via INTRAVENOUS

## 2015-11-28 MED ORDER — CYCLOBENZAPRINE 10 MG TAB
10 mg | ORAL | Status: AC
Start: 2015-11-28 — End: 2015-11-27
  Administered 2015-11-28: 02:00:00 via ORAL

## 2015-11-28 MED ORDER — CARVEDILOL 3.125 MG TAB
3.125 mg | Freq: Two times a day (BID) | ORAL | Status: DC
Start: 2015-11-28 — End: 2015-12-01
  Administered 2015-11-28 – 2015-12-01 (×5): via ORAL

## 2015-11-28 MED ORDER — METRONIDAZOLE IN SODIUM CHLORIDE (ISO-OSM) 500 MG/100 ML IV PIGGY BACK
500 mg/100 mL | Freq: Three times a day (TID) | INTRAVENOUS | Status: DC
Start: 2015-11-28 — End: 2015-11-30
  Administered 2015-11-28 – 2015-11-30 (×7): via INTRAVENOUS

## 2015-11-28 MED ORDER — MORPHINE 2 MG/ML INJECTION
2 mg/mL | INTRAMUSCULAR | Status: DC | PRN
Start: 2015-11-28 — End: 2015-11-28

## 2015-11-28 MED ORDER — IOPAMIDOL 76 % IV SOLN
370 mg iodine /mL (76 %) | Freq: Once | INTRAVENOUS | Status: AC
Start: 2015-11-28 — End: 2015-11-27
  Administered 2015-11-28: 03:00:00 via INTRAVENOUS

## 2015-11-28 MED ORDER — SODIUM CHLORIDE 0.9% BOLUS IV
0.9 % | Freq: Once | INTRAVENOUS | Status: AC
Start: 2015-11-28 — End: 2015-11-27
  Administered 2015-11-28: 03:00:00 via INTRAVENOUS

## 2015-11-28 MED ORDER — DILTIAZEM 60 MG IR TAB
60 mg | Freq: Every day | ORAL | Status: DC
Start: 2015-11-28 — End: 2015-12-01
  Administered 2015-11-28 – 2015-12-01 (×4): via ORAL

## 2015-11-28 MED ORDER — LISINOPRIL 20 MG TAB
20 mg | Freq: Every day | ORAL | Status: DC
Start: 2015-11-28 — End: 2015-12-01
  Administered 2015-11-28 – 2015-12-01 (×4): via ORAL

## 2015-11-28 MED ORDER — LEVOFLOXACIN IN D5W 750 MG/150 ML IV PIGGY BACK
750 mg/150 mL | INTRAVENOUS | Status: DC
Start: 2015-11-28 — End: 2015-11-30
  Administered 2015-11-28 – 2015-11-30 (×2): via INTRAVENOUS

## 2015-11-28 MED ORDER — DEXAMETHASONE SODIUM PHOSPHATE 4 MG/ML IJ SOLN
4 mg/mL | INTRAMUSCULAR | Status: AC
Start: 2015-11-28 — End: 2015-11-27
  Administered 2015-11-28: 02:00:00 via INTRAVENOUS

## 2015-11-28 MED ORDER — APIXABAN 2.5 MG TABLET
2.5 mg | Freq: Two times a day (BID) | ORAL | Status: DC
Start: 2015-11-28 — End: 2015-12-01
  Administered 2015-11-28 – 2015-12-01 (×5): via ORAL

## 2015-11-28 MED ORDER — KETOROLAC TROMETHAMINE 30 MG/ML INJECTION
30 mg/mL (1 mL) | INTRAMUSCULAR | Status: AC
Start: 2015-11-28 — End: 2015-11-27
  Administered 2015-11-28: 02:00:00 via INTRAVENOUS

## 2015-11-28 MED ORDER — SODIUM CHLORIDE 0.9 % IJ SYRG
Freq: Once | INTRAMUSCULAR | Status: AC
Start: 2015-11-28 — End: 2015-11-27
  Administered 2015-11-28: 03:00:00 via INTRAVENOUS

## 2015-11-28 MED ORDER — AMITRIPTYLINE 25 MG TAB
25 mg | Freq: Every evening | ORAL | Status: DC
Start: 2015-11-28 — End: 2015-12-01
  Administered 2015-11-28 – 2015-12-01 (×3): via ORAL

## 2015-11-28 MED ORDER — SODIUM CHLORIDE 0.9 % IV
INTRAVENOUS | Status: DC
Start: 2015-11-28 — End: 2015-12-01
  Administered 2015-11-28 – 2015-12-01 (×3): via INTRAVENOUS

## 2015-11-28 MED ORDER — ATORVASTATIN 40 MG TAB
40 mg | Freq: Every day | ORAL | Status: DC
Start: 2015-11-28 — End: 2015-12-01
  Administered 2015-11-28 – 2015-12-01 (×4): via ORAL

## 2015-11-28 MED ORDER — OXYBUTYNIN CHLORIDE SR 5 MG 24 HR TAB
5 mg | Freq: Every day | ORAL | Status: DC
Start: 2015-11-28 — End: 2015-12-01
  Administered 2015-11-28 – 2015-12-01 (×4): via ORAL

## 2015-11-28 MED ORDER — MORPHINE 2 MG/ML INJECTION
2 mg/mL | INTRAMUSCULAR | Status: AC
Start: 2015-11-28 — End: 2015-11-28

## 2015-11-28 MED FILL — SODIUM CHLORIDE 0.9 % IV: INTRAVENOUS | Qty: 1000

## 2015-11-28 MED FILL — KETOROLAC TROMETHAMINE 30 MG/ML INJECTION: 30 mg/mL (1 mL) | INTRAMUSCULAR | Qty: 1

## 2015-11-28 MED FILL — ATORVASTATIN 40 MG TAB: 40 mg | ORAL | Qty: 1

## 2015-11-28 MED FILL — LEVOFLOXACIN IN D5W 750 MG/150 ML IV PIGGY BACK: 750 mg/150 mL | INTRAVENOUS | Qty: 150

## 2015-11-28 MED FILL — CARVEDILOL 3.125 MG TAB: 3.125 mg | ORAL | Qty: 1

## 2015-11-28 MED FILL — LISINOPRIL 20 MG TAB: 20 mg | ORAL | Qty: 1

## 2015-11-28 MED FILL — ELIQUIS 2.5 MG TABLET: 2.5 mg | ORAL | Qty: 1

## 2015-11-28 MED FILL — MORPHINE 2 MG/ML INJECTION: 2 mg/mL | INTRAMUSCULAR | Qty: 1

## 2015-11-28 MED FILL — CYCLOBENZAPRINE 10 MG TAB: 10 mg | ORAL | Qty: 1

## 2015-11-28 MED FILL — METRONIDAZOLE IN SODIUM CHLORIDE (ISO-OSM) 500 MG/100 ML IV PIGGY BACK: 500 mg/100 mL | INTRAVENOUS | Qty: 100

## 2015-11-28 MED FILL — DEXAMETHASONE SODIUM PHOSPHATE 4 MG/ML IJ SOLN: 4 mg/mL | INTRAMUSCULAR | Qty: 3

## 2015-11-28 MED FILL — DILTIAZEM 60 MG IR TAB: 60 mg | ORAL | Qty: 2

## 2015-11-28 MED FILL — OXYBUTYNIN CHLORIDE SR 5 MG 24 HR TAB: 5 mg | ORAL | Qty: 1

## 2015-11-28 MED FILL — AMITRIPTYLINE 25 MG TAB: 25 mg | ORAL | Qty: 2

## 2015-11-28 NOTE — ED Notes (Signed)
Patient left department with RN  for transportation to inpatient bed. Patient's VS at the time of transfer were BP 151/52, 94% on RA, denies pain at this time, 98.5 orally, HR 75 rhythm on the monitor. Patient was alert but not oriented with limited verbal response d/t dementia at time of transfer, receiving RN aware of mentation.    TRANSFER - OUT REPORT:    Verbal report given to Don, RN(name) on [name]  being transferred to [facility](unit) for routine progression of care       Report consisted of patient???s Situation, Background, Assessment and   Recommendations(SBAR).     Information from the following report(s) SBAR, Kardex, ED Summary, [facility] and Recent Results was reviewed with the receiving nurse.      Opportunity for questions and clarification was provided.

## 2015-11-28 NOTE — Progress Notes (Signed)
TRANSFER - IN REPORT:    Verbal report received from erin  rn(name) on [name]  being received from ed(unit) for routine progression of care      Report consisted of patient???s Situation, Background, Assessment and   Recommendations(SBAR).     Information from the following report(s) SBAR, Kardex, [facility] and Recent Results was reviewed with the receiving nurse.    Opportunity for questions and clarification was provided.      Assessment completed upon patient???s arrival to unit and care assumed.

## 2015-11-28 NOTE — H&P (Signed)
[facility]   [address]   [place], [place] [id number]   HISTORY AND PHYSICAL       Name:  Opal SidlesSAUVAIN, Kimberly A   MR#:  [id number]   DOB:  [birth year]   Account #:  [id number]        Date of Adm:  11/27/2015       PRIMARY CARE PHYSICIAN: Coralee NorthSirisha N. Fransico MichaelBrennan, MD    CHIEF COMPLAINT: Intractable back pain.    HISTORY OF PRESENT ILLNESS: The patient is a 82 year old   female with past medical history of previous lumbar laminectomy   according to son's report, also history of coronary artery disease, who   [facility] who was admitted today to   be in severe pain from her back and was unable to ambulate with her   walker as usual. According to patient's account of the son, the pain   started about a week ago; however, she did not report it, but when her   son visited her today she was in excruciating pain and unable to   ambulate. The decision was made to bring her to ED to evaluate   further. In the ED she was assessed and found to have a disk   pathology as well as diverticulitis. The patient has not any fever or   chills, no nausea, vomiting, diarrhea, constipation, no bloody or black   stools. The patient is significantly hard of hearing; therefore, obtaining   review of systems was challenging.    PAST MEDICAL HISTORY   Includes    1. AFib.   2. Macular degeneration.   3. Osteoporosis.    4. GERD.   5. Hypercholesterolemia.    ALLERGIES: SHE HAS ALLERGIES TO CODEINE.    CURRENT HOME MEDICATIONS   Include   1. Fosamax 70 mg every 7 days.   2. Elavil 50 mg nightly.   3. Eliquis 2.5 mg twice a day.   4. Lipitor 40 mg daily.   5. Carvedilol 3.125 mg twice a day.   6. Diltiazem 120 mg daily.   7. Lisinopril 20 mg a day.   8. Detrol --mg a day.    SOCIAL HISTORY: Does not smoke or drink alcohol. She lives in   nursing facility.     CODE STATUS: DNI/DNR.    FAMILY HISTORY: Not pertinent positives, no problems.     REVIEW OF SYSTEMS: As described above in HPI. In addition, all    other systems are reviewed, no additional findings noted.      PHYSICAL EXAMINATION   VITAL SIGNS: Blood pressure 149/52, heart rate 69, temperature 98.3,   respiratory rate 18, and O2 saturation 95% on room air.   GENERAL: The patient is lying in bed. She is vision impaired.   HEENT: Normocephalic, atraumatic, not pale, anicteric.   NECK: No JVD. No lymphadenopathy.   CHEST: Clear bilaterally to auscultation.   CARDIOVASCULAR: S1, S2, regular rate and rhythm.   ABDOMEN: Nontender, nondistended, bowel sounds present.   CENTRAL NERVOUS SYSTEM: Alert, oriented to person, place and   time.   EXTREMITIES: No pedal edema. Pedal pulses present.    LABORATORY DATA: Showed normal CBC, normal serum chemistry.   CT scan of the lumbar spine showed T5 mild central spinal canal   stenosis from bony retropulsion. CT scan of the abdomen showed   biliary dilatation, most likely chronic, as well as mild ascending colon   diverticulitis.  ASSESSMENT: The patient is a 82 year old female with past history   as above.    IMPRESSION   1. Admit to general medical floor.   2. Acute diverticulitis: The patient did have pain on arrival. However,   this has improved. Therefore, put patient on IV fluids as well as empiric   antibiotics using Zosyn. If the need arises, we will consult   Gastroenterology.   3. Intractable back pain: .  The patient states she   does improve with Tylenol. Will continue to monitor her closely and add   additional pain regimen to control pain better.   4.  Home medications as above.    On behalf of [facility], I would thank   you for the opportunity of taking care of this patient of yours.        Karsten Ro, MD      II / SB   D:  11/28/2015   01:07   T:  11/28/2015   02:11   Job #:  [id number]

## 2015-11-28 NOTE — Progress Notes (Signed)
Problem: Mobility Impaired (Adult and Pediatric)  Goal: *Acute Goals and Plan of Care (Insert Text)  Physical Therapy Goals  Initiated 11/28/2015  1. Patient will move from supine to sit and sit to supine in bed with minimal assistance/contact guard assist within 7 day(s).   2. Patient will transfer from bed to chair and chair to bed with minimal assistance/contact guard assist using the least restrictive device within 7 day(s).  3. Patient will perform sit to stand with minimal assistance/contact guard assist within 7 day(s).  4. Patient will ambulate with minimal assistance/contact guard assist for 25 feet with the least restrictive device within 7 day(s).   5. Patient will tolerate sitting 4 hours per day without pain in 7 days.  PHYSICAL THERAPY EVALUATION  Patient: Kimberly [name] (82 y.o. female)  Date: 11/28/2015  Primary Diagnosis: Acute diverticulitis  Back pain        Precautions: falls         ASSESSMENT :  Chart reviewed, pt cleared for PT eval by nursing.  Based on the objective data described below, the patient presents with mobility impairments, limited by back pain, recent onset. She moved to [place] 2 months ago to be near son, lives at [facility], and had recent decline in mobility due to pain.  Pt was attempting to get OOB to use the commode and yelling for help; unable to manage call bell.  She was assisted to [facility], cooperated well with transfers, standing about 30 seconds for hygeine activity.  Pt is fearful when transfering, but does relatively well and has the habit of reaching for chair arms.  Needs cues to release chair arms when coming to stand.  Will need RW in room.  Once standing, pt is able to take small unsteady shuffling steps to chair. Total of 3 transfers performed; bed to [facility], BSC to bed, bed to chair.  Alarm placed in chair, and nursing notified.  Per nursing, pt was having difficulty swallowing, with some choking noted in bed with HOB elevated.  Once pt transferred to chair,  attempted 2 small sips of water with instructions for chin tuck, and she swallowed those sips without choking or coughing.  Nursing notified.  Speech consult has been ordered.     Patient will benefit from skilled intervention to address the above impairments.  Patient???s rehabilitation potential is considered to be Good  Factors which may influence rehabilitation potential include:   [X]          None noted  [ ]          Mental ability/status  [ ]          Medical condition  [ ]          Home/family situation and support systems  [ ]          Safety awareness  [ ]          Pain tolerance/management  [ ]          Other:        PLAN :  Recommendations and Planned Interventions:  [X]            Bed Mobility Training             [ ]     Neuromuscular Re-Education  [X]            Transfer Training                   [ ]     Orthotic/Prosthetic Training  [X]   Gait Training                         [ ]     Modalities  [X]            Therapeutic Exercises           [ ]     Edema Management/Control  [X]            Therapeutic Activities            [X]     Patient and Family Training/Education  [ ]            Other (comment):     Frequency/Duration: Patient will be followed by physical therapy  6 times a week to address goals.  Discharge Recommendations: Skilled Nursing Facility  Further Equipment Recommendations for Discharge: none       SUBJECTIVE:   Patient stated ???I have to use the commode.???      OBJECTIVE DATA SUMMARY:   HISTORY:    Past Medical History:   Diagnosis Date   ??? CAD (coronary artery disease)     ??? Dementia     ??? Hypertension     ??? Ill-defined condition       a fib   ??? Ill-defined condition       macular degeneration   ??? Ill-defined condition       osteoporosis   ??? Ill-defined condition       GERD   ??? Ill-defined condition       hypercholesterolemia     Past Surgical History:   Procedure Laterality Date   ??? HX ORTHOPAEDIC         Prior Level of Function/Home Situation: nursing facility;    Personal factors and/or comorbidities impacting plan of care:   HOH  Home Situation  Home Environment: Skilled nursing facility  Care Facility Name: [facility]  # Steps to Enter: 0  Support Systems: Skilled nursing facility  Patient Expects to be Discharged to:: Skilled nursing facility  Current DME Used/Available at Home: Wheelchair, Environmental consultant, rolling     EXAMINATION/PRESENTATION/DECISION MAKING:   Critical Behavior:  Neurologic State: Alert  Orientation Level: Disoriented to person, Disoriented to place  Cognition: Memory loss (follow one step commands easily)     Hearing:  Auditory  Auditory Impairment: Hard of hearing, bilateral  Skin:  intact  Edema: none noted  Range Of Motion:  AROM: Generally decreased, functional      Strength:    Strength: Generally decreased, functional      Tone & Sensation:    no deficits noted       Coordination:   no deficit noted  Vision:      Functional Mobility:  Bed Mobility:     Supine to Sit: Moderate assistance  Sit to Supine: Moderate assistance  Scooting: Minimum assistance  Transfers:  Sit to Stand: Assist x1;Moderate assistance  Stand to Sit: Assist x1;Moderate assistance  Stand Pivot Transfers: Moderate assistance     Bed to Chair: Assist x1;Maximum assistance           Other: pt participates as able  Balance:   Sitting - Static: Good (unsupported)  Sitting - Dynamic: Fair (occasional)  Standing - Static: Poor;Constant support  Standing - Dynamic : Poor  Ambulation/Gait Training:  Distance (ft): 2 Feet (ft)  Assistive Device: Gait belt (pt's arms on PT shoulders)  Ambulation - Level of Assistance: Assist x1;Moderate assistance   Gait Abnormalities: Antalgic;Decreased step  clearance;Shuffling gait   Base of Support: Widened  Stance: Left decreased;Right decreased  Speed/Cadence: Slow;Shuffled  Step Length: Left shortened;Right shortened                  Functional Measure:  Tinetti test:      Sitting Balance: 1  Arises: 0  Attempts to Rise: 0   Immediate Standing Balance: 0  Standing Balance: 0  Nudged: 0  Eyes Closed: 0  Turn 360 Degrees - Continuous/Discontinuous: 0  Turn 360 Degrees - Steady/Unsteady: 0  Sitting Down: 0  Balance Score: 1  Indication of Gait: 0  R Step Length/Height: 0  L Step Length/Height: 0  R Foot Clearance: 0  L Foot Clearance: 0  Step Symmetry: 1  Step Continuity: 0  Path: 0  Trunk: 0  Walking Time: 0  Gait Score: 1  Total Score: 2         Tinetti Test and G-code impairment scale:  Percentage of Impairment CH     0%    CI     1-19% CJ     20-39% CK     40-59% CL     60-79% CM     80-99% CN      100%   Tinetti  Score 0-28 28 23-27 17-22 12-16 6-11 1-5 0          Tinetti Tool Score Risk of Falls  <19 = High Fall Risk  19-24 = Moderate Fall Risk  25-28 = Low Fall Risk  Tinetti ME. Performance-Oriented Assessment of Mobility Problems in Elderly Patients. JAGS 1986; [id number]. (Scoring Description: PT Bulletin Feb. 10, 1993)     Older adults: Lonn Georgia et al, 2009; n = 1000 [place] elderly evaluated with ABC, POMA, ADL, and IADL)  ?? Mean POMA score for males aged 65-79 years = 26.21(3.40)  ?? Mean POMA score for females age 82-79 years = 25.16(4.30)  ?? Mean POMA score for males over 80 years = 23.29(6.02)  ?? Mean POMA score for females over 80 years = 17.20(8.32)            G codes:  In compliance with CMS???s Claims Based Outcome Reporting, the following G-code set was chosen for this patient based on their primary functional limitation being treated:     The outcome measure chosen to determine the severity of the functional limitation was the Tinetti with a score of 2/28 which was correlated with the impairment scale.      ?? Mobility - Walking and Moving Around:              [phone] - CURRENT STATUS:    CM - 80%-99% impaired, limited or restricted              [id number] - GOAL STATUS:           CL - 60%-79% impaired, limited or restricted              U0454 - D/C STATUS:                       ---------------To be determined---------------       Physical Therapy Evaluation Charge Determination   History Examination Presentation Decision-Making   LOW Complexity : Zero comorbidities / personal factors that will impact the outcome / POC LOW Complexity : 1-2 Standardized tests and measures addressing body structure, function, activity limitation and / or participation in recreation  LOW Complexity : Stable, uncomplicated  LOW Complexity :  FOTO score of 75-100      Based on the above components, the patient evaluation is determined to be of the following complexity level: LOW      Pain:  Pain Scale 1: Numeric (0 - 10)  Pain Intensity 1: 0              Activity Tolerance:   fair     After treatment:   [X]          Patient left in no apparent distress sitting up in chair  [ ]          Patient left in no apparent distress in bed  [X]          Call bell left within reach  [X]          Nursing notified  [X]          Caregiver present  [X]          Bed alarm activated      COMMUNICATION/EDUCATION:   The patient???s plan of care was discussed with: Registered Nurse.  [X]          Fall prevention education was provided and the patient/caregiver indicated understanding.  [X]          Patient/family have participated as able in goal setting and plan of care.  [X]          Patient/family agree to work toward stated goals and plan of care.  [ ]          Patient understands intent and goals of therapy, but is neutral about his/her participation.  [ ]          Patient is unable to participate in goal setting and plan of care.     Thank you for this referral.  Weston AnnaLaurie Z Daigle, PT   Time Calculation: 23 mins

## 2015-11-28 NOTE — H&P (Signed)
Dictated #[id number]  - Intractable back pain  - Diveticulitis

## 2015-11-28 NOTE — Progress Notes (Signed)
Pt choked on her morning pills. Rapid response was called. Apple sauce was encouraged to help push the pills down. The apple sauce seemed to work. Cassie,NP gave orders.

## 2015-11-28 NOTE — Progress Notes (Signed)
Primary Nurse Donald J Lange, RN and Lawrence  rn, RN performed a dual skin assessment on this patient No impairment noted  Braden score is 19

## 2015-11-28 NOTE — Progress Notes (Addendum)
Hospitalist Progress Note  Paulino Rily, NP  Office: [phone]      Date of Service:  12/17/15  NAME:  Kimberly [name]  DOB:  [birth year]  MRN:  [id number]      Admission Summary:   Kimberly [name] is a 82 yo female with PMH significant for Alzheimer's Dementia, CAD, multiple chronic compression fractures who resides at [facility] who presented on 6/10 with "intractable back/abdominal pain." CT scans done with chronic compression fractures but no obvious acute fracture but evidence of mild diverticulitis.     Interval history / Subjective:   RRT called this am as Kimberly [name] began choking after taking her morning medications. She never dropped her sats and with some applesauce was able to swallow them down after they got caught in her throat. She reports her back is not the cause of her pain and is more so her abdomen although has denied pain at all throughout the day. After discussion with son as well she was c/o more abdominal pain radiating around to lower back than true back pain and no evidence of fall.      Assessment & Plan:     Mild diverticulitis evidenced on CT A/P  - Started Levaquin & Flagyl  - Was tolerating mechanical soft diet from pain perspective but made NPO this morning for repeated choking events  - Hold on GI consult for now and will monitor pain    Chronic compression deformities of thoracic/lumbar spine  - No point tenderness to anywhere along spine and denies pain at present  - Hold on obtaining consult and son would like Dr. Lodema Hong called if we feel need for spine MD involved on Monday  - Discussed risk of MRI after son asked about this and would hold on obtaining this as laying her flat would be highly unlikely without sedation given advanced dementia  - PT/OT and will evaluate if she has pain with this  - Bracing her electively may be a better option than invasive intervention if pain occurs    Alzheimers Dementia   - Supportive care  - Decreased narcotic dose and will only use this as absolutely needed for severe pain  - Attempt Tylenol first for pain control    Dysphagia  - Make NPO and will get SLP evaluation given multiple choking events    Atrial fibrillation  - Rate controlled on Coreg and Cardizem  - OAC with Eliquis    Hypertension  - Continue above meds + Lisinopril    Code status: DNR  DVT prophylaxis: Eliquis    Care Plan discussed with: Patient, RN, son updated at length at the bedside regarding plan of care on Dec 17, 2022.   Disposition: Lives in [facility]F of [facility] and suspect she needs SNF level     Hospital Problems  Date Reviewed: 12-17-15          Codes Class Noted POA    * (Principal)Back pain ICD-10-CM: M54.9  ICD-9-CM: 724.5  12/17/15 Unknown        Acute diverticulitis ICD-10-CM: K57.92  ICD-9-CM: 562.11  17-Dec-2015 Unknown        Dementia ICD-10-CM: F03.90  ICD-9-CM: 294.20  2015-12-17 Unknown                Review of Systems:   Denied CP or SOB, no back pain, no abdominal pain (poor historian)       Vital Signs:    Last 24hrs VS reviewed since prior  progress note. Most recent are:  Visit Vitals   ??? BP 168/69   ??? Pulse 96   ??? Temp 97.9 ??F (36.6 ??C)   ??? Resp 22   ??? Ht 5' (1.524 m)   ??? Wt 49.9 kg (110 lb)   ??? SpO2 100%   ??? Breastfeeding No   ??? BMI 21.48 kg/m2       No intake or output data in the 24 hours ending 11/28/15 1351     Physical Examination:             Constitutional:  No acute distress, cooperative, pleasant??   ENT:  Oral mucous moist, oropharynx benign. Neck supple   Resp:  CTA bilaterally. No wheezing/rhonchi/rales. No accessory muscle use   CV:  Irregular rhythm, normal rate, no murmurs, gallops, rubs    GI:  Soft, non distended, non tender. normoactive bowel sounds, no hepatosplenomegaly     Musculoskeletal:  No edema, warm, 2+ pulses throughout    Neurologic:  Moves all extremities.  Alert and Oriented x person only. Follows simple commands. No focal deficits      Psych:  Poor insight, calm and cooperative  Skin:  Thin skin with poor turgor       Data Review:    Review and/or order of clinical lab test  Review and/or order of tests in the radiology section of CPT  Review and/or order of tests in the medicine section of CPT      Labs:     Recent Labs      11/27/15   1940   WBC  7.8   HGB  12.2   HCT  38.1   PLT  190     Recent Labs      11/27/15   1940   NA  141   K  4.7   CL  109*   CO2  23   BUN  19   CREA  0.80   GLU  88   CA  8.6     Recent Labs      11/27/15   1940   SGOT  28   ALT  19   AP  103   TBILI  0.3   TP  6.4   ALB  2.9*   GLOB  3.5     No results for input(s): INR, PTP, APTT in the last 72 hours.    No lab exists for component: INREXT, INREXT   No results for input(s): FE, TIBC, PSAT, FERR in the last 72 hours.   No results found for: FOL, RBCF   No results for input(s): PH, PCO2, PO2 in the last 72 hours.  No results for input(s): CPK, CKNDX, TROIQ in the last 72 hours.    No lab exists for component: CPKMB  No results found for: CHOL, CHOLX, CHLST, CHOLV, HDL, LDL, DLDL, LDLC, DLDLP, TGLX, TRIGL, TRIGP, CHHD, CHHDX  No results found for: [facility]  Lab Results   Component Value Date/Time    Color YELLOW/STRAW 11/27/2015 10:13 PM    Appearance CLEAR 11/27/2015 10:13 PM    Specific gravity 1.013 11/27/2015 10:13 PM    pH (UA) 6.0 11/27/2015 10:13 PM    Protein NEGATIVE  11/27/2015 10:13 PM    Glucose NEGATIVE  11/27/2015 10:13 PM    Ketone NEGATIVE  11/27/2015 10:13 PM    Bilirubin NEGATIVE  11/27/2015 10:13 PM    Urobilinogen 0.2 11/27/2015 10:13 PM    Nitrites NEGATIVE  11/27/2015 10:13  PM    Leukocyte Esterase SMALL 11/27/2015 10:13 PM    Epithelial cells FEW 11/27/2015 10:13 PM    Bacteria NEGATIVE  11/27/2015 10:13 PM    WBC 5-10 11/27/2015 10:13 PM    RBC 0-5 11/27/2015 10:13 PM         Medications Reviewed:     Current Facility-Administered Medications   Medication Dose Route Frequency   ??? amitriptyline (ELAVIL) tablet 50 mg  50 mg Oral QHS    ??? apixaban (ELIQUIS) tablet 2.5 mg  2.5 mg Oral BID   ??? atorvastatin (LIPITOR) tablet 40 mg  40 mg Oral DAILY   ??? carvedilol (COREG) tablet 3.125 mg  3.125 mg Oral BID WITH MEALS   ??? dilTIAZem (CARDIZEM) IR tablet 120 mg  120 mg Oral DAILY   ??? lisinopril (PRINIVIL, ZESTRIL) tablet 20 mg  20 mg Oral DAILY   ??? oxybutynin chloride XL (DITROPAN XL) tablet 5 mg  5 mg Oral DAILY   ??? 0.9% sodium chloride infusion  50 mL/hr IntraVENous CONTINUOUS   ??? levoFLOXacin (LEVAQUIN) 750 mg in D5W IVPB  750 mg IntraVENous Q48H   ??? metroNIDAZOLE (FLAGYL) IVPB premix 500 mg  500 mg IntraVENous Q8H   ??? morphine injection 0.5 mg  0.5 mg IntraVENous Q4H PRN   ??? acetaminophen (TYLENOL) tablet 1,000 mg  1,000 mg Oral Q6H PRN     ______________________________________________________________________  EXPECTED LENGTH OF STAY: - - -  ACTUAL LENGTH OF STAY:          0  > 35 minutes spent in care and coordination and updating family               Paulino Rilyassandra A Carsynn Bethune, NP

## 2015-11-29 LAB — METABOLIC PANEL, BASIC
Anion gap: 6 mmol/L (ref 5–15)
BUN/Creatinine ratio: 20 (ref 12–20)
BUN: 17 MG/DL (ref 6–20)
CO2: 25 mmol/L (ref 21–32)
Calcium: 7.7 MG/DL — ABNORMAL LOW (ref 8.5–10.1)
Chloride: 109 mmol/L — ABNORMAL HIGH (ref 97–108)
Creatinine: 0.86 MG/DL (ref 0.55–1.02)
GFR est AA: >60 mL/min/{1.73_m2} (ref >60)
GFR est non-AA: >60 mL/min/{1.73_m2} (ref >60)
Glucose: 105 mg/dL — ABNORMAL HIGH (ref 65–100)
Potassium: 3.9 mmol/L (ref 3.5–5.1)
Sodium: 140 mmol/L (ref 136–145)

## 2015-11-29 LAB — CBC WITH AUTOMATED DIFF
ABS. BASOPHILS: 0 10*3/uL (ref 0.0–0.1)
ABS. EOSINOPHILS: 0 10*3/uL (ref 0.0–0.4)
ABS. LYMPHOCYTES: 1.1 10*3/uL (ref 0.8–3.5)
ABS. MONOCYTES: 0.3 10*3/uL (ref 0.0–1.0)
ABS. NEUTROPHILS: 7.8 10*3/uL (ref 1.8–8.0)
BASOPHILS: 0 % (ref 0–1)
EOSINOPHILS: 0 % (ref 0–7)
HCT: 37 % (ref 35.0–47.0)
HGB: 12 g/dL (ref 11.5–16.0)
LYMPHOCYTES: 12 % (ref 12–49)
MCH: 29.1 PG (ref 26.0–34.0)
MCHC: 32.4 g/dL (ref 30.0–36.5)
MCV: 89.6 FL (ref 80.0–99.0)
MONOCYTES: 3 % — ABNORMAL LOW (ref 5–13)
NEUTROPHILS: 85 % — ABNORMAL HIGH (ref 32–75)
PLATELET: 169 10*3/uL (ref 150–400)
RBC: 4.13 M/uL (ref 3.80–5.20)
RDW: 13.4 % (ref 11.5–14.5)
WBC: 9.2 10*3/uL (ref 3.6–11.0)

## 2015-11-29 LAB — CULTURE, MRSA

## 2015-11-29 MED FILL — ATORVASTATIN 40 MG TAB: 40 mg | ORAL | Qty: 1

## 2015-11-29 MED FILL — DILTIAZEM 60 MG IR TAB: 60 mg | ORAL | Qty: 2

## 2015-11-29 MED FILL — OXYBUTYNIN CHLORIDE SR 5 MG 24 HR TAB: 5 mg | ORAL | Qty: 1

## 2015-11-29 MED FILL — LISINOPRIL 20 MG TAB: 20 mg | ORAL | Qty: 1

## 2015-11-29 MED FILL — CARVEDILOL 3.125 MG TAB: 3.125 mg | ORAL | Qty: 1

## 2015-11-29 MED FILL — ELIQUIS 2.5 MG TABLET: 2.5 mg | ORAL | Qty: 1

## 2015-11-29 MED FILL — METRONIDAZOLE IN SODIUM CHLORIDE (ISO-OSM) 500 MG/100 ML IV PIGGY BACK: 500 mg/100 mL | INTRAVENOUS | Qty: 100

## 2015-11-29 NOTE — Other (Deleted)
Thank you for documenting the diagnosis of A-fib for this patient.  Please clarify if this diagnosis could be further specified as:     =>Paroxysmal  =>Persistent  =>Permanent  =>Other explanations of clinical findings  =>Unable to Determine    Please document your response indicating your clinical opinion in your progress notes and discharge summary.    -------------------------------------------------------------------------------------------------  Paroxysmal:  begins suddenly and stops on its own. Symptoms can be mild or severe. They stop within about a week, but usually in less than 24 hours.    Persistent: continues for more than a week. It may stop on its own, or it can be stopped with treatment.    Permanent: cannot be restored with treatment    Thank you  Angela G  CDMP  [phone]

## 2015-11-29 NOTE — Progress Notes (Signed)
Problem: Mobility Impaired (Adult and Pediatric)  Goal: *Acute Goals and Plan of Care (Insert Text)  Physical Therapy Goals  Initiated 11/28/2015  1. Patient will move from supine to sit and sit to supine in bed with minimal assistance/contact guard assist within 7 day(s).   2. Patient will transfer from bed to chair and chair to bed with minimal assistance/contact guard assist using the least restrictive device within 7 day(s).  3. Patient will perform sit to stand with minimal assistance/contact guard assist within 7 day(s).  4. Patient will ambulate with minimal assistance/contact guard assist for 25 feet with the least restrictive device within 7 day(s).   5. Patient will tolerate sitting 4 hours per day without pain in 7 days.   PHYSICAL THERAPY TREATMENT  Patient: Kimberly [name] (82 y.o. female)  Date: 11/29/2015  Diagnosis: Acute diverticulitis  Back pain Back pain       Precautions: Fall      ASSESSMENT:  Pt is making steady progress, up in chair and agreeable to participate with therapy, given cues for hand placement during transfers and completed with minimal assist, ambulated with rolling walker x 130 feet with minimal assistance and occasional assistance to maneuver walker, pt reports she uses a rollator at baseline and reports less difficulty controlling walker, pt denied pain with activity, recommend SNF  Progression toward goals:      Improving appropriately and progressing toward goals      Improving slowly and progressing toward goals      Not making progress toward goals and plan of care will be adjusted       PLAN:  Patient continues to benefit from skilled intervention to address the above impairments.  Continue treatment per established plan of care.  Discharge Recommendations:  Skilled Nursing Facility  Further Equipment Recommendations for Discharge:  Has a rollator       SUBJECTIVE:   Patient stated ???This walker is not as good as mine.???  Pt uses a rollator  and had some difficulty managing the rolling walker.      OBJECTIVE DATA SUMMARY:   Critical Behavior:  Neurologic State: Alert  Orientation Level: Oriented to person, Oriented to place, Oriented to situation, Disoriented to time  Cognition: Memory loss, decreased safety awareness, Follows commands  Safety/Judgement: Decreased safety awareness   Functional Mobility Training:  Bed Mobility:   not assessed, OOB in chair      Transfers:  Sit to Stand: Minimum assistance;Additional time;Assist x1  Stand to Sit: Minimum assistance;Additional time;Assist x1      Pt needs cues for hand placement during transfers                     Balance:  Sitting: Intact  Standing: Impaired  Standing - Static: Good  Standing - Dynamic : Good (with rolling walker)  Ambulation/Gait Training:  Distance (ft): 130 Feet (ft)  Assistive Device: Gait belt;Walker, rolling  Ambulation - Level of Assistance: Minimal assistance;Assist x1        Gait Abnormalities: Decreased step clearance              Speed/Cadence: Slow  Step Length: Left shortened;Right shortened                 Pain:  Pain Scale 1: Numeric (0 - 10)  Pain Intensity 1: 0, denies pain               Activity Tolerance:   good  After treatment:   [X]     Patient left in no apparent distress sitting up in chair  [ ]     Patient left in no apparent distress in bed  [X]     Call bell left within reach  [X]     Nursing notified  [ ]     Caregiver present  [X]     Bed alarm activated      COMMUNICATION/COLLABORATION:   The patient???s plan of care was discussed with: Registered Nurse     Gavinn Collard Celene SkeenS Mesa Janus   Time Calculation: 11 mins

## 2015-11-29 NOTE — Progress Notes (Signed)
Hospitalist Progress Note  Office: [phone]      Date of Service:  11/29/2015  NAME:  Kimberly SidlesMarian A Holdren  DOB:  04/29/1918  MRN:  [id number]      Admission Summary:   82 yo woman with h/o lumbar laminectomy, CAD, atrial fibrillation, osteoporosis, macular degeneration, GERD, and hyperlipidemia was BIBEMS from [facility] on 11/27/15 with back pain. She was admitted with acute diverticulitis and intractable back pain.     Interval history / Subjective:   C/o facial redness and warmth that started this morning, decreased appetite and some pain across abdomen; reported BM yesterday     Assessment & Plan:     Mild diverticulitis (POA)  - evidenced on CT A/P  - empiric Levaquin & Flagyl x 10 days  - tolerating mechanical soft diet from pain perspective but made NPO yesterday morning for repeated choking events  - hold on GI consult for now and monitor pain  ??  Chronic compression deformities of thoracic/lumbar spine  - possible pathologic fractures due to osteoporosis; takes bisphosphonate at home  - CT T-spine 6/10 chronic partial compression fractures of T12 and L1  - no point tenderness to anywhere along spine and denies pain at present  - hold on obtaining consult and son would like Dr. Lodema HongSimpson called if we feel need for Ortho Spine  - previous hospitalist discussed risk of MRI after son asked about this and would hold on obtaining this as laying her flat would be highly unlikely without sedation given advanced dementia  - PT/OT recommending SNF  - bracing her electively may be a better option than invasive intervention if pain occurs  - check vitamin D  ??  Alzheimer dementia  - stable, supportive care  - decreased narcotic dose and will only use this as absolutely needed for severe pain  - attempt APAP first for pain control  ??  Dysphagia  - s/p SLP evaluation given multiple choking events: recommended mechanical soft diet  ??   Atrial fibrillation  - unknown if paroxsymal, persistent or chronic as no ECGs, cardiac testing or cardiac evals in our system  - rate controlled on carvedilol and diltiazem  - OAC with apixaban  - asymptomatic; f/u outpatient  ??  Hypertension - controlled with carvedilol, diltiazem, lisinopril    Decreased appetite  - check prealbumin, consult Nutrition  - may consider appetite stimulant such as mirtazapine qhs    Code status: DNR  DVT prophylaxis: apixaban    Care Plan discussed with: Patient/Family, Nurse and Case Manager  Disposition: Came from [facility] but SNF recommended. Initial admission order written 11/28/15 at 1am (so 3rd midnight will be 6/13) so can d/c to SNF on 6/14 if bed at [facility] available.     Hospital Problems  Date Reviewed: 11/28/2015          Codes Class Noted POA    * (Principal)Back pain ICD-10-CM: M54.9  ICD-9-CM: 724.5  11/28/2015 Unknown        Acute diverticulitis ICD-10-CM: K57.92  ICD-9-CM: 562.11  11/28/2015 Unknown        Dementia ICD-10-CM: F03.90  ICD-9-CM: 294.20  11/28/2015 Unknown            Review of Systems:   Pertinent items are noted in HPI.     Vital Signs:    Last 24hrs VS reviewed since prior progress note. Most recent are:  Visit Vitals   ??? BP 165/54 (BP 1 Location: Left  arm, BP Patient Position: Sitting;At rest)   ??? Pulse 75   ??? Temp 97.9 ??F (36.6 ??C)   ??? Resp 14   ??? Ht 5' (1.524 m)   ??? Wt 49 kg (108 lb)   ??? SpO2 98%   ??? Breastfeeding No   ??? BMI 21.09 kg/m2     No intake or output data in the 24 hours ending 11/29/15 1640     Physical Examination:     Constitutional:  awake, no acute distress, cooperative, hard of hearing, pleasantly demented??   ENT:  oral mucousa moist, oropharynx benign  Neck supple   Resp:  CTA bilaterally, no wheezing/rhonchi/rales   CV:  irregular rhythm, normal rate, no m/r/g appreciated, no LE edema, +pulses    GI:  +BS, soft, non distended, non tender     Musculoskeletal:  moves all extremities     Neurologic:  awake, hard of hearing, pleasantly demented, follows simple commands       Data Review:    Review and/or order of clinical lab test  Review and/or order of tests in the radiology section of CPT  Review and/or order of tests in the medicine section of CPT    Labs:     Recent Labs      11/29/15   1033  11/27/15   1940   WBC  9.2  7.8   HGB  12.0  12.2   HCT  37.0  38.1   PLT  169  190     Recent Labs      11/29/15   1033  11/27/15   1940   NA  140  141   K  3.9  4.7   CL  109*  109*   CO2  25  23   BUN  17  19   CREA  0.86  0.80   GLU  105*  88   CA  7.7*  8.6     Recent Labs      11/27/15   1940   SGOT  28   ALT  19   AP  103   TBILI  0.3   TP  6.4   ALB  2.9*   GLOB  3.5     No results for input(s): INR, PTP, APTT in the last 72 hours.    No lab exists for component: INREXT   No results for input(s): FE, TIBC, PSAT, FERR in the last 72 hours.   No results found for: FOL, RBCF   No results for input(s): PH, PCO2, PO2 in the last 72 hours.  No results for input(s): CPK, CKNDX, TROIQ in the last 72 hours.    No lab exists for component: CPKMB  No results found for: CHOL, CHOLX, CHLST, CHOLV, HDL, LDL, DLDL, LDLC, DLDLP, TGLX, TRIGL, TRIGP, CHHD, CHHDX  No results found for: [facility]  Lab Results   Component Value Date/Time    Color YELLOW/STRAW 11/27/2015 10:13 PM    Appearance CLEAR 11/27/2015 10:13 PM    Specific gravity 1.013 11/27/2015 10:13 PM    pH (UA) 6.0 11/27/2015 10:13 PM    Protein NEGATIVE  11/27/2015 10:13 PM    Glucose NEGATIVE  11/27/2015 10:13 PM    Ketone NEGATIVE  11/27/2015 10:13 PM    Bilirubin NEGATIVE  11/27/2015 10:13 PM    Urobilinogen 0.2 11/27/2015 10:13 PM    Nitrites NEGATIVE  11/27/2015 10:13 PM    Leukocyte Esterase SMALL 11/27/2015 10:13 PM    Epithelial  cells FEW 11/27/2015 10:13 PM    Bacteria NEGATIVE  11/27/2015 10:13 PM    WBC 5-10 11/27/2015 10:13 PM    RBC 0-5 11/27/2015 10:13 PM     Medications Reviewed:     Current Facility-Administered Medications    Medication Dose Route Frequency   ??? amitriptyline (ELAVIL) tablet 50 mg  50 mg Oral QHS   ??? apixaban (ELIQUIS) tablet 2.5 mg  2.5 mg Oral BID   ??? atorvastatin (LIPITOR) tablet 40 mg  40 mg Oral DAILY   ??? carvedilol (COREG) tablet 3.125 mg  3.125 mg Oral BID WITH MEALS   ??? dilTIAZem (CARDIZEM) IR tablet 120 mg  120 mg Oral DAILY   ??? lisinopril (PRINIVIL, ZESTRIL) tablet 20 mg  20 mg Oral DAILY   ??? oxybutynin chloride XL (DITROPAN XL) tablet 5 mg  5 mg Oral DAILY   ??? 0.9% sodium chloride infusion  50 mL/hr IntraVENous CONTINUOUS   ??? levoFLOXacin (LEVAQUIN) 750 mg in D5W IVPB  750 mg IntraVENous Q48H   ??? metroNIDAZOLE (FLAGYL) IVPB premix 500 mg  500 mg IntraVENous Q8H   ??? morphine injection 0.5 mg  0.5 mg IntraVENous Q4H PRN   ??? acetaminophen (TYLENOL) tablet 1,000 mg  1,000 mg Oral Q6H PRN     ______________________________________________________________________  EXPECTED LENGTH OF STAY: 2d 16h  ACTUAL LENGTH OF STAY:          1                 Delon Sacramento, MD

## 2015-11-29 NOTE — Other (Deleted)
There is documentation of chronic compression fractures for this patient; to accurately capture the SOI & ROM for your patient can this be further clarified to refect Pathologic Fractures in the setting of Osteoporosis requiring Fosamax.    =>  =>Other Explanation of clinical findings  =>Unable to Determine (no explanation of clinical findings)    Please clarify and document your clinical opinion in the progress notes and discharge summary including the definitive and/or presumptive diagnosis, (suspected or probable), related to the above clinical findings. Please include clinical findings supporting your diagnosis.    Thank you  Kathrin Pennerngela G  CDMP  [phone]

## 2015-11-29 NOTE — Progress Notes (Signed)
Care Management Interventions  PCP Verified by CM: Yes  MyChart Signup: No  Discharge Durable Medical Equipment: No  Physical Therapy Consult: Yes  Occupational Therapy Consult: Yes  Speech Therapy Consult: Yes  Current Support Network: Assisted Living ([place])  Confirm Follow Up Transport: Family  Plan discussed with Pt/Family/Caregiver: Yes  Freedom of Choice Offered: Yes  Discharge Location  Discharge Placement:  (ALF vs SNF)    Chart reviewed for transitions of care, discussed patient during rounds. Patient was admitted with intractable back/abdominal pain and was diagnosed with mild diverticulitis. Past medical history includes alzheimers disease, CAD, chronic compression deformities of thoracic/lumbar spine. Patient is currently NPO due to an episode of choking.    Cm met with patient and her son Yeraldine Forney, [phone]) to explain role and offer support. Patient currently resides in assisted living at [place], having just moved in 2 months ago. Cm discussed with son that as of yesterday, therapy was recommending SNF placement. He is agreeable to with either SNF or therapy in her ALF, as [facility] does have a therapy department there. If SNF, patient would return to [facility] and go to their health care unit.     Patient was admitted on 6/11, so if SNF is warranted, she would need to have a qualifying 3-night stay, which would be 6/14.     Cm will see how patient does with therapy today, and then contact [facility] to get their recommendation for the appropriate level of care.  [employer], [place]

## 2015-11-29 NOTE — Progress Notes (Signed)
Problem: Dysphagia (Adult)  Goal: *Acute Goals and Plan of Care (Insert Text)  Speech Therapy Goals  Initiated 11/29/2015  1. Patient will tolerate mechanical soft diet with thin liquids without signs/symptoms of aspiration given minimal cues within 7 day(s).  SPEECH LANGUAGE PATHOLOGY BEDSIDE SWALLOW EVALUATION  Patient: Kimberly [name] (82 y.o. female)  Date: 11/29/2015  Primary Diagnosis: Acute diverticulitis  Back pain        Precautions: Reflux         ASSESSMENT :  Based on the objective data described below, the patient presents with mild oropharyngeal dysphagia characterized by mildly slow mastication and mild pharyngeal swallow delay.  Patient presented with multiple swallows and belching with all thin liquid trials.  Per chart, patient with reported pill becoming stuck yesterday.  All these symptoms are consistent with esophageal dysphagia.  Further patient at risk for esophageal dysphagia given history of GERD.  Patient may benefit from further evaluation of esophageal function or GI consult.  Given bedside presentation and respiratory status recommend initiate mechanical soft diet, thin liquids.  Medication may be given 1 at a time in puree.     Patient will benefit from skilled intervention to address the above impairments.  Patient???s rehabilitation potential is considered to be Fair  Factors which may influence rehabilitation potential include:               None noted              Mental ability/status              Medical condition              Home/family situation and support systems              Safety awareness              Pain tolerance/management              Other:        PLAN :  Recommendations and Planned Interventions:  Mechanical soft diet, thin liquids  Sit up for all meals and remain upright for 30 minutes after  Small, single bites and sips  Medication one a time whole in puree     Frequency/Duration: Patient will be followed by speech-language pathology  3 times a week to address goals.  Discharge Recommendations: To Be Determined       SUBJECTIVE:   Patient stated ???I'm healthy???.  Patient agreed to session.  Family at bedside.  RN approves visit.    Per chart and family patient with pill becoming stuck yesterday.      OBJECTIVE:       Past Medical History:   Diagnosis Date   ??? CAD (coronary artery disease)     ??? Dementia     ??? Hypertension     ??? Ill-defined condition       a fib   ??? Ill-defined condition       macular degeneration   ??? Ill-defined condition       osteoporosis   ??? Ill-defined condition       GERD   ??? Ill-defined condition       hypercholesterolemia     Past Surgical History:   Procedure Laterality Date   ??? HX ORTHOPAEDIC         Prior Level of Function/Home Situation:   Home Situation  Home Environment: Assisted living  Care Facility Name: [facility]  #  Steps to Enter: 0  One/Two Story Residence: One story  Living Alone: No  Support Systems: Assisted living  Patient Expects to be Discharged to:: Assisted living  Current DME Used/Available at Home: [facility]  Diet prior to admission: Patient / family report soft diet  Current Diet:  NPO   Cognitive and Communication Status:  Neurologic State: Alert  Orientation Level: Oriented to person  Cognition: Follows commands, Memory loss           Oral Assessment:  Oral Assessment  Labial: No impairment  Dentition: Upper dentures;Natural;Intact  Oral Hygiene: Mildly dry oral mucosa  Lingual: No impairment  Velum: Unable to visualize  Mandible: No impairment  P.O. Trials:  Patient Position: Upright in bed  Vocal quality prior to P.O.: No impairment  Consistency Presented: Thin liquid;Puree;Solid  How Presented: Self-fed/presented;Cup/sip;Spoon;Straw     Bolus Acceptance: No impairment  Bolus Formation/Control: Impaired  Type of Impairment: Delayed  Propulsion: Delayed (# of seconds)  Oral Residue: Less than 10% of bolus  Initiation of Swallow: Delayed (# of seconds)  Laryngeal Elevation: Functional   Aspiration Signs/Symptoms: None  Pharyngeal Phase Characteristics: Multiple swallows              Oral Phase Severity: Mild  Pharyngeal Phase Severity : Mild     NOMS:   The NOMS functional outcome measure was used to quantify this patient's level of swallowing impairment.  Based on the NOMS, the patient was determined to be at level 5 for swallow function      G Codes:  In compliance with CMS???s Claims Based Outcome Reporting, the following G-code set was chosen for this patient based the use of the NOMS functional outcome to quantify this patient's level of swallowing impairment.     Using the NOMS, the patient was determined to be at level 5 for swallow function which correlates with the CJ= 20-39% level of severity.     Based on the objective assessment provided within this note, the current, goal, and discharge g-codes are as follows:     Swallow  Swallowing:  G8996 Swallow Current Status CJ= 20-39%  G8997 Swallow Goal Status CI= 1-19%         NOMS Swallowing Levels:  Level 1 (CN): NPO  Level 2 (CM): NPO but takes consistency in therapy  Level 3 (CL): Takes less than 50% of nutrition p.o. and continues with nonoral feedings; and/or safe with mod cues; and/or max diet restriction  Level 4 (CK): Safe swallow but needs mod cues; and/or mod diet restriction; and/or still requires some nonoral feeding/supplements  Level 5 (CJ): Safe swallow with min diet restriction; and/or needs min cues  Level 6 (CI): Independent with p.o.; rare cues; usually self cues; may need to avoid some foods or needs extra time  Level 7 (CH): Independent for all p.o.  ASHA. (2003). National Outcomes Measurement System (NOMS): Adult Speech-Language Pathology User's Guide.           Pain:  Pain Scale 1: Numeric (0 - 10)  Pain Intensity 1: 0     After treatment:   [ ]             Patient left in no apparent distress sitting up in chair  [X]             Patient left in no apparent distress in bed  [X]             Call bell left within reach   [X]   Nursing notified  [X]             Caregiver present  [ ]             Bed alarm activated      COMMUNICATION/EDUCATION:   The patient???s plan of care including recommendations, planned interventions, and recommended diet changes were discussed with: Registered Nurse.     Patient was educated regarding role of SLP, POC, diet and reflux precautions.  Patient nodded.  Family verbalized understanding.  [ ]             Posted safety precautions in patient's room.  [X]             Patient/family have participated as able in goal setting and plan of care.  [X]             Patient/family agree to work toward stated goals and plan of care.  [ ]             Patient understands intent and goals of therapy, but is neutral about his/her participation.  [ ]             Patient is unable to participate in goal setting and plan of care.     Thank you for this referral.  Aneta Mins, SLP  Time Calculation: 25 mins

## 2015-11-29 NOTE — Progress Notes (Addendum)
Problem: Self Care Deficits Care Plan (Adult)  Goal: *Acute Goals and Plan of Care (Insert Text)  Occupational Therapy Goals  Initiated 11/29/2015  1. Patient will perform grooming while standing for 5 minutes with supervision/set-up within 7 day(s).  2. Patient will perform lower body dressing with minimal assistance/contact guard assist within 7 day(s).  3. Patient will perform bathing with supervision/set-up within 7 day(s).  4. Patient will perform toilet transfers with minimal assistance/contact guard assist within 7 day(s).  5. Patient will perform all aspects of toileting with minimal assistance/contact guard assist within 7 day(s).  6. Patient will utilize energy conservation techniques during functional activities with verbal cues within 7 day(s).  OCCUPATIONAL THERAPY EVALUATION  Patient: Kimberly [name] (82 y.o. female)  Date: 11/29/2015  Primary Diagnosis: Acute diverticulitis  Back pain        Precautions:  Fall      ASSESSMENT :  Based on the objective data described below, the patient presents with decreased dynamic standing balance, decreased distal functional reach, impulsivity, poor safety awareness, cognition, orientation, impaired vision, HOH, and back pain following admission for acute diverticulitis. Pt received in bed with son present and agreeable to OT. Pt oriented to name, place, and situation; disoriented to time. Pt was independent prior to admission as per son's record. Pt AROM WFL for shoulder flexion, shoulder internal/ external rotation, elbow extension/ flexion, supination/ pronation, wrist flexion/ extension, and finger flexion. Pt strength is generally decreased with 4-/5 for shoulder flexion and elbow flexion, and 4/5 for grip strength. Pt transferred supine to EOB CGA and verbal cues to scoot to the edge of the bed. Pt transferred minA bed to chair with RW due to decreased safety awareness, impulsivity and lack of  standing balance; pt required multiple cues to redirect towards chair, push up off bed with hands, and reach back for chair when sitting. While sitting in chair, pt participated in grooming activities of washing face, brushing teeth/ dentures, and brushing hair supervision/setup with cues to stay on task. Pt left sitting in chair with chair alarm on, call bell in reach, and all needs met. Discussed discharge plans with pt and son; currently recommend dc to rehab due to pt being below functional baseline due to above stated impairments. Next session, recommend toilet transfer to [facility] or dressing activity while in chair.       Patient will benefit from skilled intervention to address the above impairments.  Patient???s rehabilitation potential is considered to be Good  Factors which may influence rehabilitation potential include:   [ ]              None noted  [X]              Mental ability/status  [ ]              Medical condition  [ ]              Home/family situation and support systems  [X]              Safety awareness  [ ]              Pain tolerance/management  [ ]              Other:        PLAN :  Recommendations and Planned Interventions:  [X]                Self Care Training                  [  X]        Therapeutic Activities  [X]                Functional Mobility Training    [X]         Cognitive Retraining  [X]                Therapeutic Exercises           [X]         Endurance Activities  [X]                Balance Training                   [ ]         Neuromuscular Re-Education  [ ]                Visual/Perceptual Training     [X]    Home Safety Training  [X]                Patient Education                 [X]         Family Training/Education  [ ]                Other (comment):     Frequency/Duration: Patient will be followed by occupational therapy 5 times a week to address goals.  Discharge Recommendations: Rehab  Further Equipment Recommendations for Discharge: to be determined        SUBJECTIVE:    Patient stated ???[birth year]90.??? when asked what year it was      OBJECTIVE DATA SUMMARY:   HISTORY:   Past Medical History:   Diagnosis Date   ??? CAD (coronary artery disease)     ??? Dementia     ??? Hypertension     ??? Ill-defined condition       a fib   ??? Ill-defined condition       macular degeneration   ??? Ill-defined condition       osteoporosis   ??? Ill-defined condition       GERD   ??? Ill-defined condition       hypercholesterolemia     Past Surgical History:   Procedure Laterality Date   ??? HX ORTHOPAEDIC            Prior Level of Function/Home Situation: Independent at ALF ([facility] with RW; received help from staff with ADLs as needed  Expanded or extensive additional review of patient history:      Home Situation  Home Environment: [place] Name: [place] [facility] [place]  # Steps to Enter: 0  One/Two Story Residence: One story  Living Alone: No  Support Systems: Assisted living, Child(ren)  Patient Expects to be Discharged to:: Assisted living  Current DME Used/Available at Home: Walker  [X]   Right hand dominant             [ ]   Left hand dominant     EXAMINATION OF PERFORMANCE DEFICITS:  Cognitive/Behavioral Status:  Neurologic State: Alert  Orientation Level: Oriented to person;Oriented to place;Oriented to situation;Disoriented to time  Cognition: Memory loss;Poor safety awareness;Follows commands  Perception: Appears intact  Perseveration: No perseveration noted  Safety/Judgement: Decreased awareness of need for safety;Decreased awareness of need for assistance     Skin: appears intact           Edema: no UE edema noted     Hearing:  Auditory  Auditory Impairment: Hard of hearing, bilateral     Vision/Perceptual:                           Acuity: Impaired near vision;Impaired far vision          Range of Motion:  Right/ Left Upper Extremity:  Shoulder flexion: [facility]  Shoulder abduction: [facility]  Shoulder internal rotation: [facility]  Shoulder external rotation: [facility]  Elbow flexion: WFL  Elbow extension: WFL   Wrist flexion: WFL  Wrist extension: WFL  Finger flexion: WFL     AROM: Within functional limits  PROM: Within functional limits                       Strength:  Right/ Left Upper Extremity:  Shoulder flexion 4-/5  Elbow flexion 4-/5  Grip Strength 4/5  Strength: Generally decreased, functional                 Coordination:  Coordination: Within functional limits  Fine Motor Skills-Upper: Left Intact;Right Intact    Gross Motor Skills-Upper: Left Intact;Right Intact     Tone & Sensation:  Tone: Normal  Sensation: Intact                 Balance:  Sitting: Intact;With support  Sitting - Static: Good (unsupported)  Sitting - Dynamic: Fair (occasional)  Standing: Intact;With support  Standing - Static: Fair  Standing - Dynamic : Fair     Functional Mobility and Transfers for ADLs:  Bed Mobility:  Rolling: Supervision  Supine to Sit: Contact guard assistance  Scooting: Supervision (verbal cues)     Transfers:  Sit to Stand: Contact guard assistance (verbal cues to push up off bed with hands)  Stand to Sit: Contact guard assistance (verbal cues to reach back)  Bed to Chair: Additional time;Minimum assistance (verbal cues)  Toilet Transfer : Minimum assistance (to [facility])     ADL Assessment:  Feeding: Supervision;Setup     Oral Facial Hygiene/Grooming: Supervision;Setup     Bathing: Minimum assistance (for LE, sitting)     Upper Body Dressing: Supervision;Setup (sitting)     Lower Body Dressing: Moderate assistance;Adaptive equipment;Additional time     Toileting: Moderate assistance;Additional time;Adaptive equipment                 ADL Intervention and task modifications:        Grooming  Washing Face: Supervision/set-up  Brushing Teeth: Supervision/set-up  Brushing/Combing Hair: Supervision/set-up  Cues: Verbal cues provided                                   Cognitive Retraining  Safety/Judgement: Decreased awareness of need for safety;Decreased awareness of need for assistance     Functional Measure:  Barthel Index:       Bathing: 0  Bladder: 5  Bowels: 5  Grooming: 5  Dressing: 5  Feeding: 5  Mobility: 0  Stairs: 0  Toilet Use: 5  Transfer (Bed to Chair and Back): 10  Total: 40         Barthel and G-code impairment scale:  Percentage of impairment CH  0% CI  1-19% CJ  20-39% CK  40-59% CL  60-79% CM  80-99% CN  100%   Barthel Score 0-100 100 99-80 79-60 59-40 20-39 1-19    0   Barthel Score 0-20 20 17-19 13-16 9-12  5-8 1-4 0      The Barthel ADL Index: Guidelines  1. The index should be used as a record of what a patient does, not as a record of what a patient could do.  2. The main aim is to establish degree of independence from any help, physical or verbal, however minor and for whatever reason.  3. The need for supervision renders the patient not independent.  4. A patient's performance should be established using the best available evidence. Asking the patient, friends/relatives and nurses are the usual sources, but direct observation and common sense are also important. However direct testing is not needed.  5. Usually the patient's performance over the preceding 24-48 hours is important, but occasionally longer periods will be relevant.  6. Middle categories imply that the patient supplies over 50 per cent of the effort.  7. Use of aids to be independent is allowed.     Daneen Schick., Barthel, D.W. [phone]). Functional evaluation: the Barthel Index. [place] (14)2.  Lucianne Lei der [place], J.J.M.F, [place], Diona Browner., Oris Drone., [place], [place] (1999). Measuring the change indisability after inpatient rehabilitation; comparison of the responsiveness of the Barthel Index and Functional Independence Measure. Journal of Neurology, Neurosurgery, and Psychiatry, 66(4), [phone].  Wilford Sports, N.J.A, [place],  W.J.M, & [place], M.A. (2004.) Assessment of post-stroke quality of life in cost-effectiveness studies: The usefulness of the Barthel Index and the EuroQoL-5D. Quality of Life Research, 13, 427-43         G codes:   In compliance with CMS???s Claims Based Outcome Reporting, the following G-code set was chosen for this patient based on their primary functional limitation being treated:     The outcome measure chosen to determine the severity of the functional limitation was the [facility] with a score of 40/100 which was correlated with the impairment scale.      ?? Self Care:              [phone] - CURRENT STATUS:    CK - 40%-59% impaired, limited or restricted              Q2595 - GOAL STATUS:           CI - 1%-19% impaired, limited or restricted              G3875 - D/C STATUS:                       ---------------To be determined---------------      Occupational Therapy Evaluation Charge Determination   History Examination Decision-Making   LOW Complexity : Brief history review  MEDIUM Complexity : 3-5 performance deficits relating to physical, cognitive , or psychosocial skils that result in activity limitations and / or participation restrictions MEDIUM Complexity : Patient may present with comorbidities that affect occupational performnce. Miniml to moderate modification of tasks or assistance (eg, physical or verbal ) with assesment(s) is necessary to enable patient to complete evaluation       Based on the above components, the patient evaluation is determined to be of the following complexity level: LOW      Activity Tolerance:   VSS. Tolerated activity well  Please refer to the flowsheet for vital signs taken during this treatment.  After treatment:   [X]  Patient left in no apparent distress sitting up in chair  [ ]  Patient left in no apparent distress in bed  [X]  Call bell left within reach  [X]  Nursing  notified  [X]  Caregiver present  [X]  Bed alarm activated      COMMUNICATION/EDUCATION:   The patient???s plan of care was discussed with: Registered Nurse.  [X]  Home safety education was provided and the patient/caregiver indicated understanding.  [X]  Patient/family have participated as able in goal setting and plan of care.   [X]  Patient/family agree to work toward stated goals and plan of care.  [ ]  Patient understands intent and goals of therapy, but is neutral about his/her participation.  [ ]  Patient is unable to participate in goal setting and plan of care.  This patient???s plan of care is appropriate for delegation to OTA.     Thank you for this referral.  Cameron Sprang      Regarding student involvement in patient care:  A student participated in this treatment session. Per CMS Medicare statements and AOTA guidelines I certify that the following was true:  1. I was present and directly observed the entire session.  2. I made all skilled judgments and clinical decisions regarding care.  3. I am the practitioner responsible for assessment, treatment, and documentation.

## 2015-11-30 LAB — CBC W/O DIFF
HCT: 35.8 % (ref 35.0–47.0)
HGB: 11.6 g/dL (ref 11.5–16.0)
MCH: 29.1 PG (ref 26.0–34.0)
MCHC: 32.4 g/dL (ref 30.0–36.5)
MCV: 89.7 FL (ref 80.0–99.0)
PLATELET: 170 10*3/uL (ref 150–400)
RBC: 3.99 M/uL (ref 3.80–5.20)
RDW: 13.7 % (ref 11.5–14.5)
WBC: 6.7 10*3/uL (ref 3.6–11.0)

## 2015-11-30 LAB — METABOLIC PANEL, BASIC
Anion gap: 7 mmol/L (ref 5–15)
BUN/Creatinine ratio: 21 — ABNORMAL HIGH (ref 12–20)
BUN: 15 MG/DL (ref 6–20)
CO2: 23 mmol/L (ref 21–32)
Calcium: 8.1 MG/DL — ABNORMAL LOW (ref 8.5–10.1)
Chloride: 112 mmol/L — ABNORMAL HIGH (ref 97–108)
Creatinine: 0.71 MG/DL (ref 0.55–1.02)
GFR est AA: >60 mL/min/{1.73_m2} (ref >60)
GFR est non-AA: >60 mL/min/{1.73_m2} (ref >60)
Glucose: 95 mg/dL (ref 65–100)
Potassium: 3.6 mmol/L (ref 3.5–5.1)
Sodium: 142 mmol/L (ref 136–145)

## 2015-11-30 LAB — VITAMIN D, 25 HYDROXY: Vitamin D 25-Hydroxy: 33.9 ng/mL (ref 30–100)

## 2015-11-30 LAB — PREALBUMIN: Prealbumin: 15.7 mg/dL — ABNORMAL LOW (ref 20.0–40.0)

## 2015-11-30 MED ORDER — LEVOFLOXACIN 750 MG TAB
750 mg | ORAL | Status: DC
Start: 2015-11-30 — End: 2015-12-01

## 2015-11-30 MED ORDER — METRONIDAZOLE 250 MG TAB
250 mg | Freq: Three times a day (TID) | ORAL | Status: DC
Start: 2015-11-30 — End: 2015-12-01
  Administered 2015-11-30 – 2015-12-01 (×3): via ORAL

## 2015-11-30 MED FILL — LEVOFLOXACIN IN D5W 750 MG/150 ML IV PIGGY BACK: 750 mg/150 mL | INTRAVENOUS | Qty: 150

## 2015-11-30 MED FILL — METRONIDAZOLE 250 MG TAB: 250 mg | ORAL | Qty: 2

## 2015-11-30 MED FILL — ELIQUIS 2.5 MG TABLET: 2.5 mg | ORAL | Qty: 1

## 2015-11-30 MED FILL — ATORVASTATIN 40 MG TAB: 40 mg | ORAL | Qty: 1

## 2015-11-30 MED FILL — ACETAMINOPHEN 500 MG TAB: 500 mg | ORAL | Qty: 2

## 2015-11-30 MED FILL — LISINOPRIL 20 MG TAB: 20 mg | ORAL | Qty: 1

## 2015-11-30 MED FILL — AMITRIPTYLINE 25 MG TAB: 25 mg | ORAL | Qty: 2

## 2015-11-30 MED FILL — CARVEDILOL 3.125 MG TAB: 3.125 mg | ORAL | Qty: 1

## 2015-11-30 MED FILL — OXYBUTYNIN CHLORIDE SR 5 MG 24 HR TAB: 5 mg | ORAL | Qty: 1

## 2015-11-30 MED FILL — METRONIDAZOLE IN SODIUM CHLORIDE (ISO-OSM) 500 MG/100 ML IV PIGGY BACK: 500 mg/100 mL | INTRAVENOUS | Qty: 100

## 2015-11-30 MED FILL — DILTIAZEM 60 MG IR TAB: 60 mg | ORAL | Qty: 2

## 2015-11-30 NOTE — Progress Notes (Signed)
NUTRITION COMPLETE ASSESSMENT    RECOMMENDATIONS:   1. Mechanical soft  2. RD update food preferences  3. RD add Ensure Compact (chocolate) TID  4. Needs assist with menu selections  5. Follow SLP feeding strategies with mild oropharyngeal dysphagia     Interventions/Plan:   Food/Nutrient Delivery:           meals and snacks, commercial supplements  Nutrition Education:     Coordination of Care:    Nutrition Counseling:        Assessment:   Reason for Assessment:    Provider Consult      Diet: Mechanical soft  Supplements: RD add Ensure Compact (chocolate TID)  Nutritionally Significant Medications:  Reviewed & Includes: Lipitor, Flagyl  Meal Intake: No data found.      Subjective:  Usual appetite is normal but recently less than normal with all the pills, the fall and being sick.  UBW 125-130# and know I lost weight.  I have Ensure (chocolate) in the refrigerator at home ([facility] [place]).  Like milk with my meals.     Objective:  Hx HL, Rx Lipitor, macular degenerations, HOH wears hearing aides, CAD, Alzheimer's dementia. Admit intractable back pain with chronic compression fx, mild diverticulitis, Rx Flagyl.  ? Last BM not documented.     No prior encounters to verify wt trend.  Pt claims she's lost weight and used to weigh 125-130#, but not sure when.  Admit wt is 83% of 130# upper limit pt stated UBW.  Claims appetite, "less than normal and drinks Ensure PTA".  RD to add Ensure Compact TID = 660 kcal & 27 gm pro meets 47% kcal & 49% pro needs respectively.  RD assisted with menu selects and PCT to assist with meals.     SLP recommends mechanically altered diet with feeding strategies, small bites, upright for meals and thin liquids permitted.  Pt at nutrition risk. Could consider check B12 and D3 in elderly pt with recent poor appetite and suspected wt loss and compression fxs.  PAB 15.7 mild depletion.         Estimated Nutrition Needs:   Kcals/day: 1400 Kcals/day   Protein: 55 g (~55-60 gm (~1.1-1.2 gm/kg))  Fluid: 1400 ml (~30 mL/kg IBW)     Based On: [place] Jeor (MSJ x 1.2 + kcal wt gain/repletion)  Weight Used: Actual wt (Bed scale 49 kg)    Pt expected to meet estimated nutrient needs:     Yes with meal assist + ONS  Comparative Standards:  ;  ;      Nutrition Diagnosis:   1. Swallowing difficulty related to mild dysphagia as evidenced by SLP eval mild oropharyngeal dysphagia, NOMS 5, mech soft diet and eating strategies    2. Unintended weight loss related to appetite decline and advanced age with chronic illness as evidenced by pt reports appetite less than normal and UBW 125-130# with wt loss from recent illness      Goals:     Consume minimum ONS BID starting next 24 hr     Monitoring & Evaluation:    - Protein intake, Total energy intake, Oral fluids amount   - Weight/weight change, Protein profile    Previous Nutrition Goals Met:  [name]  Previous Recommendations:      [name]    Education & Discharge Needs:    None Identified    Participated in care plan, discharge planning, and/or interdisciplinary rounds  Cultural, religious and ethnic food preferences identified:   None    Skin Integrity: Intact    Edema: None  Last BM: ?  Food Allergies: NKFA    Anthropometrics:    Weight Loss Metrics 11/28/2015   Today's Wt 108 lb   BMI 21.09 kg/m2      Last 3 Recorded Weights in this Encounter    11/27/15 1910 11/28/15 0746   Weight: 49.9 kg (110 lb) 49 kg (108 lb)      Weight Source: Bed  Height: 5' (152.4 cm),    Body mass index is 21.09 kg/(m^2).  IBW : 45.4 kg (100 lb),    Usual Body Weight: 59 kg (130 lb) (Per pt 125-130# (? last time this range)),      Labs:    Lab Results   Component Value Date/Time    Sodium 142 11/30/2015 03:06 AM    Potassium 3.6 11/30/2015 03:06 AM    Chloride 112 11/30/2015 03:06 AM    CO2 23 11/30/2015 03:06 AM    Glucose 95 11/30/2015 03:06 AM    BUN 15 11/30/2015 03:06 AM    Creatinine 0.71 11/30/2015 03:06 AM     Calcium 8.1 11/30/2015 03:06 AM    Albumin 2.9 11/27/2015 07:40 PM     No results found for: HBA1C, HGBE8, HBA1CPOC, HBA1CEXT  Lab Results   Component Value Date/Time    Glucose 95 11/30/2015 03:06 AM      Lab Results   Component Value Date/Time    ALT (SGPT) 19 11/27/2015 07:40 PM    AST (SGOT) 28 11/27/2015 07:40 PM    Alk. phosphatase 103 11/27/2015 07:40 PM    Bilirubin, total 0.3 11/27/2015 07:40 PM        Otho Perl, RD

## 2015-11-30 NOTE — Progress Notes (Signed)
Speech Pathology  Followed up with RN and patient. RN reports excellent tolerance with mechanical soft diet and meds taken in applesauce. Followed up with patient who reports no difficulty. Patient only agreeable to thin liquids which she tolerated with no overt s/s aspiration. Continue to recommend the following:  Mechanical soft diet, thin liquids  Sit up for all meals and remain upright for 30 minutes after  Small, single bites and sips  Medication one a time whole in puree    Further ST not indicated at this time. Will sign off.   Thanks, Burgess AmorAshley M. Kliszczewicz M.S. CCC-SLP

## 2015-11-30 NOTE — Progress Notes (Signed)
Clinical Pharmacy Note: IV to PO Automatic Conversion  Please note: Kimberly [name]???s medication(s) (Levaquin and Flagyl) have been changed from IV to PO based on the following critiera:    ? Patient is taking scheduled oral medications  ? Patient is tolerating tube feeds at goal rate or a full liquid, soft or regular diet    This IV to PO conversion is based on the P&[employer] approved automatic conversion policy for eligible patients.  Please call with questions.

## 2015-11-30 NOTE — Progress Notes (Signed)
Cm continuing to follow for transitions of care, noted continued recommendations from therapy for SNF placement. Cm spoke with Eunice BlaseMila, patients[facility] nurse [facility] [phone], f: [phone]) informed her of the above. She asked that I fax over progress notes so they can determine if they can adequately care for her vs sending her to the health care unit. Cm faxed over the notes; will follow.  YUM! BrandsCaitlin Helton BSW, [place]

## 2015-11-30 NOTE — Progress Notes (Signed)
Problem: Mobility Impaired (Adult and Pediatric)  Goal: *Acute Goals and Plan of Care (Insert Text)  Physical Therapy Goals  Initiated 11/28/2015  1. Patient will move from supine to sit and sit to supine in bed with minimal assistance/contact guard assist within 7 day(s).   2. Patient will transfer from bed to chair and chair to bed with minimal assistance/contact guard assist using the least restrictive device within 7 day(s).  3. Patient will perform sit to stand with minimal assistance/contact guard assist within 7 day(s).  4. Patient will ambulate with minimal assistance/contact guard assist for 25 feet with the least restrictive device within 7 day(s).   5. Patient will tolerate sitting 4 hours per day without pain in 7 days.   PHYSICAL THERAPY TREATMENT  Patient: Kimberly [name] ([age] y.o. female)  Date: 11/30/2015  Diagnosis: Acute diverticulitis  Back pain Back pain       Precautions: Fall      ASSESSMENT:  Pt was transferring back to bed when therapist arrived, agreeable to ambulate with PT after given encouragement, pt requires minimal assistance for supine <> sit and sit <> stand, ambulated with rollator x 80 feet with CGA, pt able to maneuver rollator without difficulty, pt denies pain with activity, assisted back to bed at end of session, recommend SNF  Progression toward goals:  [ ]     Improving appropriately and progressing toward goals  [X]     Improving slowly and progressing toward goals  [ ]     Not making progress toward goals and plan of care will be adjusted       PLAN:  Patient continues to benefit from skilled intervention to address the above impairments.  Continue treatment per established plan of care.  Discharge Recommendations:  Skilled Nursing Facility  Further Equipment Recommendations for Discharge:  Has rollator        SUBJECTIVE:   Patient stated ???I want to go to bed.???  Pt pleasantly confused.       OBJECTIVE DATA SUMMARY:   Critical Behavior:  Neurologic State: Alert, Confused   Orientation Level: Oriented to place, Oriented to person, Disoriented to time, Disoriented to situation  Cognition: Memory loss, Follows commands, Poor safety awareness  Safety/Judgement: Decreased awareness of need for safety, Decreased awareness of need for assistance  Functional Mobility Training:  Bed Mobility:     Supine to Sit: Minimum assistance;Assist x1  Sit to Supine: Minimum assistance;Assist x1  Scooting: Supervision        Transfers:  Sit to Stand: Minimum assistance;Assist x1  Stand to Sit: Minimum assistance;Contact guard assistance                             Balance:  Sitting: Intact  Standing: Impaired  Standing - Static: Good  Standing - Dynamic : Good (with rollator)  Ambulation/Gait Training:  Distance (ft): 80 Feet (ft)  Assistive Device: Walker, rollator;Gait belt, manages rollator without assistance  Ambulation - Level of Assistance: Contact guard assistance;Assist x1        Gait Abnormalities: Decreased step clearance              Speed/Cadence: Slow  Step Length: Left shortened;Right shortened              Pain:  Pain Scale 1: Numeric (0 - 10)   denies pain with activity   Activity Tolerance:   good     After treatment:   [ ]   Patient left in no apparent distress sitting up in chair      Patient left in no apparent distress in bed      Call bell left within reach      Nursing notified      Caregiver present      Bed alarm activated      COMMUNICATION/COLLABORATION:   The patient???s plan of care was discussed with: Registered Nurse     Travers Goodley Celene Skeen   Time Calculation: 15 mins

## 2015-11-30 NOTE — Progress Notes (Signed)
Assessemnt/Plan:   Daily Progress Note: 11/30/2015    Admit date: 11/27/2015  6:56 PM    Hospitalist Progress Note   Kimberly [name], MDRomilda Joy [phone]; Call physician on-call through the operator 7pm-7am          PCP: Kimberly People, MD   In Hospital Procedure:   * No surgery found *  Consultants this Hospitalization:   IP CONSULT TO HOSPITALIST    In Hospital Procedure:   * No surgery found *           NAME:  Kimberly [name]      DOB:  12/05/1958  MRN:  [id number]    Admission Summary:   82 yo woman with h/o lumbar laminectomy, CAD, atrial fibrillation, osteoporosis, macular degeneration, GERD, and hyperlipidemia was BIBEMS from [facility] on 11/27/15 with back pain. She was admitted with acute diverticulitis and intractable back pain.   ??  Interval history / Subjective:   Pt comfortable, less abd pain, yet present and low back pain , over all improved per pt's report, mild nausea but eating. No sob, no cp,     ??  Assessment & Plan:   ??  Mild diverticulitis (POA)  - evidenced on CT A/P  - empiric Levaquin & Flagyl x 10 days  - tolerating mechanical soft diet from pain perspective but made NPO yesterday morning for repeated choking events  - hold on GI consult for now and monitor pain  -  Feeling better, trying to eat,   ????  Chronic compression deformities of thoracic/lumbar spine  - possible pathologic fractures due to osteoporosis; takes bisphosphonate at home  - CT T-spine 6/10 chronic partial compression fractures of T12 and L1  - no point tenderness to anywhere along spine and denies pain at present  - hold on obtaining consult and son would like Dr. Lodema Hong called if we feel need for Ortho Spine  - previous hospitalist discussed risk of MRI after son asked about this and would hold on obtaining this as laying her flat would be highly unlikely without sedation given advanced dementia  - PT/OT recommending SNF  - bracing her electively may be a better option than invasive intervention  if pain occurs  - check vitamin D  - on going pain but manageable,   ????  Alzheimer dementia  - stable, supportive care  - decreased narcotic dose and will only use this as absolutely needed for severe pain  - attempt APAP first for pain control  ????  Dysphagia  - s/p SLP evaluation given multiple choking events: recommended mechanical soft diet  ????  Atrial fibrillation  - unknown if paroxsymal, persistent or chronic as no ECGs, cardiac testing or cardiac evals in our system  - rate controlled on carvedilol and diltiazem  - OAC with apixaban  - asymptomatic; f/u outpatient  - no issues 6/13 as to tachy  ??  Hypertension - controlled with carvedilol, diltiazem, lisinopril  ??  Decreased appetite  - check prealbumin, consult Nutrition  - may consider appetite stimulant such as mirtazapine qhs  ??  Code status: DNR  DVT prophylaxis: apixaban  ??  Care Plan discussed with: Patient/Family, Nurse and Case Manager  Disposition: Came from [facility]F but SNF recommended. Initial admission order written 11/28/15 at 1am (so 3rd midnight will be 6/13) so can d/c to SNF on 6/14 if bed at [facility] SNF available.   ??  Subjective:     ROS      Review of Systems:    Constitutional       No fever   Eyes        Ears,Nose, Mouth, Throat     Cardiovascular    No cp   Respiratory     No sob   Gastrointestinal    Mild nausea   Genitourinary     No dysuria   Musculoskeletal    Low back pain   Integumentary (skin and/or breast)  No rash    Neurological       Psychiatric       Endocrine       Hematologic/Lymphatic     Allergic/Immunologic            Could NOT obtain due to:      Objective:       PHYSICAL EXAM:  Vital signs below          Constitutional:   No acute distress  ,     Alert;           Conversant;      Eyes: anicteric sclerae, moist conjunctivae;    no  lid-lag;    + PERRLA;    Ears, nose,mouth,and throat:   Normal external ears ; oropharynx clear with  moist  mucous membranes; no posterior oral lesions; and  trach is   mid line;        Respiratory:   Trachea midline;  CTA   with normal  respiratory effort; and no   intercostal retractions;    Cardiovascular: RRR, no MRGs;    Gastrointestinal: Soft, non-tender but mildly gaseous  ; no  masses or HSM; no  guarding; no  rebound;    Genitourinary:  No scrotal/labial edema,      Musculoskelatal:  extreme ties are supple;  no  peripheral edema;  neck with  FROM; atraumatic    Skin:  Temperature nl ;  turgor is nl  ; the texture nl  ;  no  rash;     Neurologic: CN 2- 12  intact;  no  focal motor weakness; reflexes ;    Psychiatric:  Appropriate  affect; alert  ; and  oriented to person, place , and time;    Heme/Lymph/Immune:  Cervical adenopathy not  present; axillary adenopathy not  present;       VITALS:   Last 24hrs VS reviewed since prior progress note. Most recent are:  Patient Vitals for the past 24 hrs:   Temp Pulse Resp BP SpO2   11/30/15 1432 97.5 ??F (36.4 ??C) 62 16 142/63 92 %   11/30/15 1047 - 76 - 124/57 -   11/30/15 0947 - 84 - (!) 207/88 -   11/30/15 0826 97.2 ??F (36.2 ??C) 71 16 189/80 99 %   11/30/15 0305 98 ??F (36.7 ??C) 67 16 165/65 94 %   11/29/15 1952 97.7 ??F (36.5 ??C) (!) 58 16 156/73 96 %   11/29/15 1749 - 61 - 163/56 -       Intake/Output Summary (Last 24 hours) at 11/30/15 1613  Last data filed at 11/30/15 0630   Gross per 24 hour   Intake                0 ml   Output              600 ml   Net             -  600 ml                  ______________________________________________________________________  Medicines:    Current Facility-Administered Medications:   ???  [START ON 12/02/2015] levoFLOXacin (LEVAQUIN) tablet 750 mg, 750 mg, Oral, Q48H, Scarlette Calico, MD  ???  metroNIDAZOLE (FLAGYL) tablet 500 mg, 500 mg, Oral, TID, Scarlette Calico, MD  ???  amitriptyline (ELAVIL) tablet 50 mg, 50 mg, Oral, QHS, Karsten Ro, MD, 50 mg at 11/29/15 2136  ???  apixaban (ELIQUIS) tablet 2.5 mg, 2.5 mg, Oral, BID, Karsten Ro, MD, 2.5 mg at 11/30/15 0959   ???  atorvastatin (LIPITOR) tablet 40 mg, 40 mg, Oral, DAILY, Karsten Ro, MD, 40 mg at 11/30/15 0949  ???  carvedilol (COREG) tablet 3.125 mg, 3.125 mg, Oral, BID WITH MEALS, Karsten Ro, MD, 3.125 mg at 11/30/15 0949  ???  dilTIAZem (CARDIZEM) IR tablet 120 mg, 120 mg, Oral, DAILY, Karsten Ro, MD, 120 mg at 11/30/15 0949  ???  lisinopril (PRINIVIL, ZESTRIL) tablet 20 mg, 20 mg, Oral, DAILY, Karsten Ro, MD, 20 mg at 11/30/15 0949  ???  oxybutynin chloride XL (DITROPAN XL) tablet 5 mg, 5 mg, Oral, DAILY, Karsten Ro, MD, 5 mg at 11/30/15 0949  ???  0.9% sodium chloride infusion, 50 mL/hr, IntraVENous, CONTINUOUS, Karsten Ro, MD, Last Rate: 50 mL/hr at 11/29/15 2142, 50 mL/hr at 11/29/15 2142  ???  morphine injection 0.5 mg, 0.5 mg, IntraVENous, Q4H PRN, Cassandra A Lewis, NP  ???  acetaminophen (TYLENOL) tablet 1,000 mg, 1,000 mg, Oral, Q6H PRN, Paulino Rily, NP, 1,000 mg at 11/30/15 1610  Procedures: see electronic medical records for all procedures/Xrays and details which were not copied into this note but were reviewed prior to creation of Plan.      LABS:  Recent Labs      11/30/15   0306  11/29/15   1033  11/27/15   1940   WBC  6.7  9.2  7.8   HGB  11.6  12.0  12.2   HCT  35.8  37.0  38.1   PLT  170  169  190     Recent Labs      11/30/15   0306  11/29/15   1033  11/27/15   1940   NA  142  140  141   K  3.6  3.9  4.7   CL  112*  109*  109*   CO2  23  25  23    BUN  15  17  19    CREA  0.71  0.86  0.80   GLU  95  105*  88   CA  8.1*  7.7*  8.6     Recent Labs      11/27/15   1940   SGOT  28   ALT  19   AP  103   TBILI  0.3   TP  6.4   ALB  2.9*   GLOB  3.5     No results for input(s): INR, PTP, APTT in the last 72 hours.    No lab exists for component: INREXT   No results for input(s): FE, TIBC, PSAT, FERR in the last 72 hours.   No results found for: FOL, RBCF   No results for input(s): PH, PCO2, PO2 in the last 72 hours.  No results for input(s): PHI, PO2I, PCO2I in the last 72 hours.   No results  for input(s): CPK, CKNDX, TROIQ in the last 72 hours.    No lab exists for component: CPKMB  No results found for: CHOL, CHOLX, CHLST, CHOLV, HDL, LDL, LDLC, DLDLP, TGLX, TRIGL, TRIGP, CHHD, CHHDX  No results found for: [facility]  Lab Results   Component Value Date/Time    Color YELLOW/STRAW 11/27/2015 10:13 PM    Appearance CLEAR 11/27/2015 10:13 PM    Specific gravity 1.013 11/27/2015 10:13 PM    pH (UA) 6.0 11/27/2015 10:13 PM    Protein NEGATIVE  11/27/2015 10:13 PM    Glucose NEGATIVE  11/27/2015 10:13 PM    Ketone NEGATIVE  11/27/2015 10:13 PM    Bilirubin NEGATIVE  11/27/2015 10:13 PM    Urobilinogen 0.2 11/27/2015 10:13 PM    Nitrites NEGATIVE  11/27/2015 10:13 PM    Leukocyte Esterase SMALL 11/27/2015 10:13 PM    Epithelial cells FEW 11/27/2015 10:13 PM    Bacteria NEGATIVE  11/27/2015 10:13 PM    WBC 5-10 11/27/2015 10:13 PM    RBC 0-5 11/27/2015 10:13 PM           CULTURES:    No results found for: SDES Lab Results   Component Value Date/Time    Culture result: MRSA NOT PRESENT 11/28/2015 07:50 AM    Culture result:  11/28/2015 07:50 AM         Screening of patient nares for MRSA is for surveillance purposes and, if positive, to facilitate isolation considerations in high risk settings. It is not intended for automatic decolonization interventions per se as regimens are not sufficiently effective to warrant routine use.        CT Results (most recent):    Results from Hospital Encounter encounter on 11/27/15   CT ABD PELV W CONT   Narrative INDICATION: Abdominal pain and back pain, dementia.  Normal white blood cell  count. Normal liver enzymes.    COMPARISON: None    TECHNIQUE:   Following the uneventful intravenous administration of 100 cc Isovue-370, thin  axial images were obtained through the abdomen and pelvis. Coronal and sagittal  reconstructions were generated. Oral contrast was not administered. CT dose  reduction was achieved through use of a standardized protocol tailored for this   examination and automatic exposure control for dose modulation.    FINDINGS:   LUNG BASES: Patchy atelectasis and interstitial lung disease.  INCIDENTALLY IMAGED HEART AND MEDIASTINUM: Normal cardiac size. Coronary artery  calcific atherosclerosis.  LIVER: No mass. Mild intrahepatic biliary dilatation.  GALLBLADDER: Surgically resected. CBD is dilated. No abrupt cut off.  SPLEEN: No mass.  PANCREAS: No mass. Ductal dilatation. No surrounding inflammation.  ADRENALS: Unremarkable.  KIDNEYS: Multiple bilateral renal cysts.  STOMACH: Unremarkable.  SMALL BOWEL: No dilatation or wall thickening.  COLON: Colonic diverticulosis. Mild mural thickening of the descending colon.  APPENDIX: Unremarkable.  PERITONEUM: No ascites or pneumoperitoneum.  RETROPERITONEUM: No lymphadenopathy or aortic aneurysm.  REPRODUCTIVE ORGANS: Uterus is small. No adnexal mass.  URINARY BLADDER: No mass or calculus.  BONES: Chronic partial compression fractures of L1 and L2.  ADDITIONAL COMMENTS: [name]         Impression IMPRESSION:    1. Mild descending colonic diverticulitis in the proper clinical setting.  2. Biliary dilatation is most likely chronic given the normal serum bilirubin.  3. No bowel obstruction.          Scarlette CalicoMark N Aleigha Gilani, MD]

## 2015-11-30 NOTE — Progress Notes (Signed)
Problem: Self Care Deficits Care Plan (Adult)  Goal: *Acute Goals and Plan of Care (Insert Text)  Occupational Therapy Goals  Initiated 11/29/2015  1. Patient will perform grooming while standing for 5 minutes with supervision/set-up within 7 day(s).  2. Patient will perform lower body dressing with minimal assistance/contact guard assist within 7 day(s).  3. Patient will perform bathing with supervision/set-up within 7 day(s).  4. Patient will perform toilet transfers with minimal assistance/contact guard assist within 7 day(s).  5. Patient will perform all aspects of toileting with minimal assistance/contact guard assist within 7 day(s).  6. Patient will utilize energy conservation techniques during functional activities with verbal cues within 7 day(s).     OCCUPATIONAL THERAPY TREATMENT  Patient: Kimberly [name] (16(82 y.o. female)  Date: 11/30/2015  Diagnosis: Acute diverticulitis  Back pain Back pain       Precautions: Fall      ASSESSMENT:  Patient making progress and verbalizing that the more she gets up and moves the more confident she is. Adjusted height of commode and placed in bathroom, patient to call for assist to and from the bathroom and use her rollator for transfer (which she calls "Duffy RhodyStanley"). No complaint of pain or nausea at this time.   Progression toward goals:  [X]        Improving appropriately and progressing toward goals  [ ]        Improving slowly and progressing toward goals  [ ]        Not making progress toward goals and plan of care will be adjusted       PLAN:  Patient continues to benefit from skilled intervention to address the above impairments.  Continue treatment per established plan of care.  Discharge Recommendations:  Skilled Nursing Facility  Further Equipment Recommendations for Discharge:         SUBJECTIVE:   Patient stated ???I feel more confident the more I get up.???      OBJECTIVE DATA SUMMARY:   Cognitive/Behavioral Status:  Neurologic State: Alert;Confused   Orientation Level: Oriented to place;Oriented to person;Disoriented to time;Disoriented to situation  Cognition: Memory loss;Follows commands;Poor Education officer, communitysafety awareness              Functional Mobility and Transfers for ADLs:  Bed Mobility:  Supine to Sit: Supervision  Sit to Supine: Minimum assistance;Assist x1  Scooting: Supervision     Transfers:  Sit to Stand: Supervision  Functional Transfers  Bathroom Mobility: Contact guard assistance  Toilet Transfer : Contact guard assistance;Adaptive equipment (max cues)     Balance:  Sitting: Intact;Without support  Standing: Impaired  Standing - Static: Fair  Standing - Dynamic : Fair     ADL Intervention:     Educated on benefit of increased time out of bed, placed bedside commode over toilet in bathroom and decreased height to increase ability of feet to touch floor. Instructed to call for assist and ambulate with "Duffy RhodyStanley" (what she calls her rollator) to and from the bathroom. Verbalized understanding. Family and RN present.      Patient instructed and indicated understanding the benefits of maintaining activity tolerance, functional mobility, and independence with self care tasks during acute stay  to ensure safe return home and to baseline. Encouraged patient to increase frequency and duration OOB, be out of bed for all meals, perform daily ADLs (as approved by RN/MD regarding bathing etc), and performing functional mobility to/from bathroom.        Toileting  Toileting Assistance: Minimum assistance  Bladder  Hygiene: Supervision/set-up  Clothing Management: Minimum assistance        Pain:  No complaint of pain      Activity Tolerance:   Good   Please refer to the flowsheet for vital signs taken during this treatment.  After treatment:    Patient left in no apparent distress sitting up in chair   Patient left in no apparent distress in bed   Call bell left within reach   Nursing notified   Caregiver present   Bed alarm activated       COMMUNICATION/COLLABORATION:   The patient???s plan of care was discussed with: Physical Therapist and Registered Nurse     Chyrl Civatte, OTR/L  Time Calculation: 17 mins

## 2015-12-01 MED ORDER — METRONIDAZOLE 500 MG TAB
500 mg | ORAL_TABLET | Freq: Three times a day (TID) | ORAL | 0 refills | Status: AC
Start: 2015-12-01 — End: ?

## 2015-12-01 MED ORDER — LEVOFLOXACIN 750 MG TAB
750 mg | ORAL_TABLET | ORAL | 9 refills | Status: AC
Start: 2015-12-01 — End: ?

## 2015-12-01 MED FILL — DILTIAZEM 60 MG IR TAB: 60 mg | ORAL | Qty: 2

## 2015-12-01 MED FILL — LISINOPRIL 20 MG TAB: 20 mg | ORAL | Qty: 1

## 2015-12-01 MED FILL — CARVEDILOL 3.125 MG TAB: 3.125 mg | ORAL | Qty: 1

## 2015-12-01 MED FILL — METRONIDAZOLE 250 MG TAB: 250 mg | ORAL | Qty: 2

## 2015-12-01 MED FILL — ACETAMINOPHEN 500 MG TAB: 500 mg | ORAL | Qty: 2

## 2015-12-01 MED FILL — ATORVASTATIN 40 MG TAB: 40 mg | ORAL | Qty: 1

## 2015-12-01 MED FILL — AMITRIPTYLINE 25 MG TAB: 25 mg | ORAL | Qty: 2

## 2015-12-01 MED FILL — OXYBUTYNIN CHLORIDE SR 5 MG 24 HR TAB: 5 mg | ORAL | Qty: 1

## 2015-12-01 MED FILL — ELIQUIS 2.5 MG TABLET: 2.5 mg | ORAL | Qty: 1

## 2015-12-01 NOTE — Progress Notes (Signed)
I have reviewed discharge instructions with the patient.  The patient verbalized understanding.  Called report to [facility] and gave to report to receiving RN Malika. Provided phone number for RN to reach me if she has any further questions.

## 2015-12-01 NOTE — Progress Notes (Signed)
Patient has been accepted to [facility] and will be going there today. Cm faxed discharge instructions to them and met with patients' son. He will provide transportation around 11 am. Report can be called to [phone].  [employer], [place]

## 2015-12-01 NOTE — Discharge Summary (Signed)
Discharge Summary       PATIENT ID: Kimberly [name]  MRN: [id number]   DATE OF BIRTH: [birth year]    DATE OF ADMISSION: 11/27/2015  6:56 PM    DATE OF DISCHARGE: 12/01/2015    PRIMARY CARE PROVIDER: Abe PeopleSirisha N Brennan, MD     ATTENDING PHYSICIAN: Scarlette CalicoMark N Jachob Mcclean, MD   DISCHARGING PROVIDER: Scarlette CalicoMark N Sheri Gatchel, MD    To contact this individual call [phone] and ask the operator to page.  If unavailable ask to be transferred the Adult Hospitalist Department.    CONSULTATIONS: IP CONSULT TO HOSPITALIST    PROCEDURES/SURGERIES: * No surgery found *    ADMITTING DIAGNOSES & HOSPITAL COURSE:   PCP: Abe PeopleSirisha N Brennan, MD   In Hospital Procedure:   * No surgery found *  Consultants this Hospitalization:   IP CONSULT TO HOSPITALIST  ??  In Hospital Procedure:   * No surgery found *   ??  ??  ??  NAME: Kimberly SidlesMarian A Calvert  ??  ??  DOB: 12/05/1958  MRN: [id number]  ??  Admission Summary:   82 yo woman with h/o lumbar laminectomy, CAD, atrial fibrillation, osteoporosis, macular degeneration, GERD, and hyperlipidemia was BIBEMS from [facility] on 11/27/15 with back pain. She was admitted with acute diverticulitis and intractable back pain.   ????  Interval history / Subjective:   Pt comfortable, less abd pain, yet present and low back pain , over all improved per pt's report, mild nausea but eating. No sob, no cp,    ??  Assessment & Plan:   ????  Mild diverticulitis (POA)  - evidenced on CT A/P  - empiric Levaquin & Flagyl x 10 days  - tolerating mechanical soft diet from pain perspective but made NPO yesterday morning for repeated choking events  - hold on GI consult for now and monitor pain  - Feeling better, trying to eat,   ????  Chronic compression deformities of thoracic/lumbar spine  - possible pathologic fractures due to osteoporosis; takes bisphosphonate at home  - CT T-spine 6/10 chronic partial compression fractures of T12 and L1  - no point tenderness to anywhere along spine and denies pain at present   - hold on obtaining consult and son would like Dr. Lodema HongSimpson called if we feel need for Ortho Spine  - previous hospitalist discussed risk of MRI after son asked about this and would hold on obtaining this as laying her flat would be highly unlikely without sedation given advanced dementia  - PT/OT recommending SNF  - bracing her electively may be a better option than invasive intervention if pain occurs  - check vitamin D  - on going pain but manageable,   ????  Alzheimer dementia  - stable, supportive care  - decreased narcotic dose and will only use this as absolutely needed for severe pain  - attempt APAP first for pain control  ????  Dysphagia  - s/p SLP evaluation given multiple choking events: recommended mechanical soft diet  ????  Atrial fibrillation  - unknown if paroxsymal, persistent or chronic as no ECGs, cardiac testing or cardiac evals in our system  - rate controlled on carvedilol and diltiazem  - OAC with apixaban  - asymptomatic; f/u outpatient  - no issues 6/13 as to tachy  ????  Hypertension - controlled with carvedilol, diltiazem, lisinopril  ????  Decreased appetite  - check prealbumin, consult Nutrition  - may consider appetite stimulant such as mirtazapine qhs  ????  Code status: DNR  DVT prophylaxis: apixaban  ????  Care Plan discussed with: Patient/Family, Nurse and Case Manager  Disposition: Came from [facility] but SNF recommended. Initial admission order written 11/28/15 at 1am (so 3rd midnight will be 6/13) so can d/c to SNF on 6/14 if bed at [facility] SNF available.   ????  ??  ??          DISCHARGE DIAGNOSES / PLAN:      1.  as above        PENDING TEST RESULTS:   At the time of discharge the following test results are still pending: na    FOLLOW UP APPOINTMENTS:    Follow-up Information     Follow up With Details Comments Contact Info    Abe People, MD In 1 week or as needed.  [address]  [address]  [place] [address]  [phone]             ADDITIONAL CARE RECOMMENDATIONS: na     DIET: Dysphagia diet-pureed  Oral Nutritional Supplements: Ensure Complete or Ensure PlusTwice daily    ACTIVITY: Activity as tolerated    WOUND CARE: na    EQUIPMENT needed: na      DISCHARGE MEDICATIONS:  Current Discharge Medication List      START taking these medications    Details   levoFLOXacin (LEVAQUIN) 750 mg tablet Take 1 Tab by mouth every fourty-eight (48) hours.  Qty: 2 Tab, Refills: 9      metroNIDAZOLE (FLAGYL) 500 mg tablet Take 1 Tab by mouth three (3) times daily.  Qty: 14 Tab, Refills: 0         CONTINUE these medications which have NOT CHANGED    Details   alendronate (FOSAMAX) 70 mg tablet Take 70 mg by mouth every seven (7) days.      amitriptyline (ELAVIL) 50 mg tablet Take 50 mg by mouth nightly.      atorvastatin (LIPITOR) 40 mg tablet Take 40 mg by mouth daily.      carvedilol (COREG) 3.125 mg tablet Take 3.125 mg by mouth two (2) times daily (with meals).      dilTIAZem (CARDIZEM) 120 mg tablet Take 120 mg by mouth daily.      apixaban (ELIQUIS) 2.5 mg tablet Take 2.5 mg by mouth two (2) times a day.      lisinopril (PRINIVIL, ZESTRIL) 20 mg tablet Take 20 mg by mouth daily.      tolterodine (DETROL) 2 mg tablet Take 2 mg by mouth daily.               NOTIFY YOUR PHYSICIAN FOR ANY OF THE FOLLOWING:   Fever over 101 degrees for 24 hours.   Chest pain, shortness of breath, fever, chills, nausea, vomiting, diarrhea, change in mentation, falling, weakness, bleeding. Severe pain or pain not relieved by medications.  Or, any other signs or symptoms that you may have questions about.    DISPOSITION:    Home With:   OT  PT  HH  RN      x Long term SNF/Inpatient Rehab    Independent/assisted living    Hospice    Other:       PATIENT CONDITION AT DISCHARGE:     Functional status   x Poor    x Deconditioned     Independent      Cognition     Lucid    x Forgetful  x Dementia      Catheters/lines (plus indication)    Foley     PICC     PEG    x None      Code status     Full code    x DNR       PHYSICAL EXAMINATION AT DISCHARGE:        Visit Vitals   ??? BP 165/86 (BP 1 Location: Right arm, BP Patient Position: At rest)   ??? Pulse 86   ??? Temp 98.1 ??F (36.7 ??C)   ??? Resp 18   ??? Ht 5' (1.524 m)   ??? Wt 49 kg (108 lb)   ??? SpO2 97%   ??? Breastfeeding No   ??? BMI 21.09 kg/m2               General:  Alert, cooperative, no distress, appears stated age.   Head:  Normocephalic, without obvious abnormality, atraumatic.   Eyes:  Conjunctivae/corneas clear. PERRL, EOMs intact.     Ears:  Normal   external ears.   Nose: Nares normal. Septum midline. Mucosa normal. No drainage or sinus tenderness.   Throat: Lips, mucosa, and tongue normal.  .   Neck: Supple, symmetrical, trachea midline, no adenopathy, thyroid: no enlargement/tenderness/nodules, no carotid bruit and no JVD.   Back:   Symmetric, no curvature. ROM normal. No CVA tenderness.   Lungs:   Clear to auscultation bilaterally.   Chest wall:  No tenderness or deformity.   Heart:  Regular rate and rhythm, S1, S2 normal,     Abdomen:   Soft, non-tender. Bowel sounds normal. No masses,  No organomegaly.   Extremities: Extremities normal, atraumatic, no cyanosis;  no edema.   Pulses: 1+ and symmetric all extremities.   Skin: Skin color, texture, turgor normal. No rashes or lesions   Lymph nodes: Cervical, supraclavicular, and axillary nodes normal.   Neurologic: Non focal Normal strength  , sensation*          CHRONIC MEDICAL DIAGNOSES:  Problem List as of Dec 18, 2015  Date Reviewed: 12/18/2015          Codes Class Noted - Resolved    * (Principal)Back pain ICD-10-CM: M54.9  ICD-9-CM: 724.5  11/28/2015 - Present        Dementia ICD-10-CM: F03.90  ICD-9-CM: 294.20  11/28/2015 - Present        RESOLVED: Acute diverticulitis ICD-10-CM: B14.78  ICD-9-CM: 562.11  11/28/2015 - 12/18/2015              Greater than 30  minutes were spent with the patient on counseling and coordination of care    Signed:   Scarlette Calico, MD  Dec 18, 2015  8:24 AM

## 2015-12-01 NOTE — Discharge Summary (Signed)
Discharge Summary by Scarlette Calico, MD at 12/01/15 [phone]                Author: Scarlette Calico, MD  Service: Internal Medicine  Author Type: Physician       Filed: 12/01/15 0827  Date of Service: 12/01/15 0824  Status: Signed          Editor: Scarlette Calico, MD (Physician)                       Discharge Summary           PATIENT ID: Kimberly [name]   MRN: [id number]     DATE OF BIRTH: 11-22-1917      DATE OF ADMISSION: 11/27/2015  6:56 PM      DATE OF DISCHARGE:  12/01/2015     PRIMARY CARE PROVIDER:  Abe People, MD       ATTENDING PHYSICIAN: Scarlette Calico, MD    DISCHARGING PROVIDER:  Scarlette Calico, MD     To contact this individual call [phone] and ask the operator to page.  If unavailable ask to be transferred the Adult Hospitalist Department.      CONSULTATIONS: IP CONSULT TO HOSPITALIST      PROCEDURES/SURGERIES: * No surgery found *      ADMITTING DIAGNOSES & HOSPITAL  COURSE:      PCP: Abe People, MD    In Hospital Procedure:    * No surgery found *   Consultants this Hospitalization:    IP CONSULT TO HOSPITALIST   ??   In Hospital Procedure:    * No surgery found *     ??   ??   ??   NAME: Kimberly [name]   ??   ??   DOB: 12/05/1958   MRN: [id number]   ??   Admission Summary:        82 yo woman with h/o lumbar laminectomy, CAD, atrial fibrillation, osteoporosis, macular degeneration, GERD, and hyperlipidemia was BIBEMS from [facility] on 11/27/15 with back pain. She was admitted with acute diverticulitis and intractable back pain.    ????   Interval history / Subjective:        Pt comfortable, less abd pain, yet present and low back pain , over all improved per pt's report, mild nausea but eating. No sob, no cp,      ??     Assessment & Plan:     ????     Mild diverticulitis (POA)   - evidenced on CT A/P   - empiric Levaquin & Flagyl x 10 days   - tolerating mechanical soft diet from pain perspective but made NPO yesterday morning for repeated choking events   - hold on GI consult for now  and monitor pain   - Feeling better, trying to eat,    ????   Chronic compression deformities of thoracic/lumbar spine   - possible pathologic fractures due to osteoporosis; takes bisphosphonate at home   - CT T-spine 6/10 chronic partial compression fractures of T12 and L1   - no point tenderness to anywhere along spine and denies pain at present   - hold on obtaining consult and son would like Dr. Lodema Hong called if we feel need for Ortho Spine   - previous hospitalist discussed risk of MRI after son asked about this and would hold on obtaining this as laying her flat would be  highly unlikely without sedation given advanced dementia   - PT/OT recommending SNF   - bracing her electively may be a better option than invasive intervention if pain occurs   - check vitamin D   - on going pain but manageable,    ????   Alzheimer dementia   - stable, supportive care   - decreased narcotic dose and will only use this as absolutely needed for severe pain   - attempt APAP first for pain control   ????   Dysphagia   - s/p SLP evaluation given multiple choking events: recommended mechanical soft diet   ????   Atrial fibrillation   - unknown if paroxsymal, persistent or chronic as no ECGs, cardiac testing or cardiac evals in our system   - rate controlled on carvedilol and diltiazem   - OAC with apixaban   - asymptomatic; f/u outpatient   - no issues 6/13 as to tachy   ????   Hypertension - controlled with carvedilol, diltiazem, lisinopril   ????   Decreased appetite   - check prealbumin, consult Nutrition   - may consider appetite stimulant such as mirtazapine qhs   ????   Code status: DNR   DVT prophylaxis: apixaban   ????   Care Plan discussed with: Patient/Family, Nurse and Case Manager   Disposition: [facility] but SNF recommended. Initial admission order written 11/28/15 at 1am (so 3rd midnight will be 6/13)  so can d/c to SNF on 6/14 [facility].     ????   ??   ??                 DISCHARGE DIAGNOSES / P  LAN:         1.     as above            PENDING TEST RESULTS:    At the time of discharge the following test results are still pending: na      FOLLOW UP APPOINTMENTS:       Follow-up Information        Follow up With  Details  Comments  Contact Info             Abe PeopleSirisha N Brennan, MD  In 1 week  or as needed.   [address]   [address]   [place] [id number]   [phone]                    ADDITIONAL CARE RECOMMENDATIONS:  na      DIET: Dysphagia diet-pureed   Oral Nutritional Supplements: Ensure Complete or Ensure PlusTwice daily      ACTIVITY: Activity as tolerated      WOUND CARE: na      EQUIPMENT needed: na         DISCHARGE MEDICATIONS:     Current Discharge Medication List              START taking these medications          Details        levoFLOXacin (LEVAQUIN) 750 mg tablet  Take 1 Tab by mouth every fourty-eight (48) hours.   Qty: 2 Tab, Refills:  9               metroNIDAZOLE (FLAGYL) 500 mg tablet  Take 1 Tab by mouth three (3) times daily.   Qty: 14 Tab, Refills:  0  CONTINUE these medications which have NOT CHANGED          Details        alendronate (FOSAMAX) 70 mg tablet  Take 70 mg by mouth every seven (7) days.               amitriptyline (ELAVIL) 50 mg tablet  Take 50 mg by mouth nightly.               atorvastatin (LIPITOR) 40 mg tablet  Take 40 mg by mouth daily.               carvedilol (COREG) 3.125 mg tablet  Take 3.125 mg by mouth two (2) times daily (with meals).               dilTIAZem (CARDIZEM) 120 mg tablet  Take 120 mg by mouth daily.               apixaban (ELIQUIS) 2.5 mg tablet  Take 2.5 mg by mouth two (2) times a day.               lisinopril (PRINIVIL, ZESTRIL) 20 mg tablet  Take 20 mg by mouth daily.               tolterodine (DETROL) 2 mg tablet  Take 2 mg by mouth daily.                            NOTIFY YOUR PHYSICIAN FOR ANY OF THE FOLLOWING:    Fever over 101 degrees for 24 hours.    Chest pain, shortness of breath, fever, chills, nausea,  vomiting, diarrhea, change in mentation, falling, weakness, bleeding. Severe pain or pain not relieved by medications.   Or, any other signs or symptoms that you may have questions about.      DISPOSITION:         Home With:     OT    PT    HH    RN                 x  Long term SNF/Inpatient Rehab       Independent/assisted living          Hospice          Other:           PATIENT CONDITION AT DISCHARGE:       Functional status       x  Poor      x  Deconditioned           Independent         Cognition          Lucid      x  Forgetful         x  Dementia         Catheters/lines (plus indication)         Foley        PICC        PEG         x  None         Code status          Full code         x  DNR         PHYSICAL EXAMINATION AT DISCHARGE:             Visit Vitals         ?  BP  165/86 (BP 1 Location: Right arm, BP Patient Position: At rest)     ?  Pulse  86     ?  Temp  98.1 ??F (36.7 ??C)     ?  Resp  18     ?  Ht  5' (1.524 m)     ?  Wt  49 kg (108 lb)     ?  SpO2  97%     ?  Breastfeeding  No         ?  BMI  21.09 kg/m2                        General:   Alert, cooperative, no distress, appears stated age.        Head:   Normocephalic, without obvious abnormality, atraumatic.     Eyes:   Conjunctivae/corneas clear. PERRL, EOMs intact.          Ears:   Normal   external ears.        Nose:  Nares normal. Septum midline. Mucosa normal. No drainage or sinus tenderness.        Throat:  Lips, mucosa, and tongue normal.  .        Neck:  Supple, symmetrical, trachea midline, no adenopathy, thyroid: no enlargement/tenderness/nodules, no carotid bruit and no JVD.     Back:    Symmetric, no curvature. ROM normal. No CVA tenderness.     Lungs:    Clear to auscultation bilaterally.        Chest wall:   No tenderness or deformity.        Heart:   Regular rate and rhythm, S1, S2 normal,       Abdomen:    Soft, non-tender. Bowel sounds normal. No masses,  No organomegaly.     Extremities:  Extremities normal, atraumatic, no  cyanosis;  no edema.     Pulses:  1+ and symmetric all extremities.     Skin:  Skin color, texture, turgor normal. No rashes or lesions     Lymph nodes:  Cervical, supraclavicular, and axillary nodes normal.        Neurologic:  Non focal Normal strength  , sensation*               CHRONIC MEDICAL DIAGNOSES:      Problem List as of Dec 22, 2015   Date Reviewed:  Dec 22, 2015                Codes  Class  Noted - Resolved             * (Principal)Back pain  ICD-10-CM: M54.9   ICD-9-CM: 724.5    11/28/2015 - Present                       Dementia  ICD-10-CM: F03.90   ICD-9-CM: 294.20    11/28/2015 - Present                       RESOLVED: Acute diverticulitis  ICD-10-CM: Z61.09   ICD-9-CM: 562.11    11/28/2015 - 12-22-2015                          Greater than 30  minutes were spent with the patient on counseling and coordination of care      Signed:    Scarlette Calico, MD   12/22/15  8:24 AM

## 2015-12-29 ENCOUNTER — Emergency Department: Admit: 2015-12-30 | Payer: MEDICARE | Primary: Family Medicine

## 2015-12-29 DIAGNOSIS — S52572A Other intraarticular fracture of lower end of left radius, initial encounter for closed fracture: Secondary | ICD-10-CM

## 2015-12-29 NOTE — ED Triage Notes (Signed)
TRIAGE: Patient arrives by EMS from home. She was using her walker and fell. LEFT wrist deformity. No other complaints. Received intranasal Fentanyl in route. She doesn't remember the fall.

## 2015-12-29 NOTE — ED Notes (Signed)
Patient discharged with vital signs stable, AVS and prescriptions, in no acute distress.    Called report to Chales AbrahamsMary Ann, [facility].  States will meet patient with wheelchair.

## 2015-12-29 NOTE — Other (Signed)
[facility] Follow Up Call Record    Discharged to : Home/Family Home/Home Health/Skilled Facility/Rehab/Assisted Living/Other_[facility]______  1) Did you receive your discharge instructions? Yes        2) Do you understand them? Yes     This Clinical research associatewriter spoke with Kimberly [name], nurse at [facility] , who reports                                                                 Kimberly [name] having pain in her wrist. She is currently at Ortho office.  3) Are you able to follow them?Yes      No further questions from nursing about discharge instructions.      If NO, what can I clarify for you?  4) Do you understand your diagnosis?Yes         5) Do you know which symptoms should prompt you to call the doctor?Yes     6) Were you able to fill and pick up any medications that were prescribed? Yes     7) You were prescribed _oxycodone__________for ______wrist pain______________.  Common side effects of this medication are___,rash, drowsiness, constipation_________________. This is not a complete list so please review the forms given from the pharmacy for a complete list.      8) Are there any questions about your medications? No            Have you scheduled any recommended doctor???s appointments (specialty, PCP) YES. Follow up appointment with Ortho today 12/30/15  If NO, what barriers are you encountering (transportation/lost contact info/cost/  didn???t think necessary/no PCP  9) If discharged with Home Health, has the agency contacted you to schedule visit? Not applicable  10) Is there anyone available to help you at home (meals, errands, transportation    monitoring) (adult children, neighbors, private duty companions) Yes    11) Are you on a special diet? No         If YES, do you understand the requirements for this diet?       Education provided?  12) If presented with cough, bronchitis, COPD, asthma, is it ok to ask that the    respiratory disease management educator call you? Not applicable      13)  A) If presented with fall, were you issued an assistive device in the ED    Are you using?Yes  This patient has own walker .  B) If given RX for device, have you obtained?Yes       If NO, barriers?  C) Therapist recommended:   Are you able to implement the suggestions? Not applicable        If NO, barriers to implementation?     D) Are you having any difficulties with mobility inside your home?     (steps, bed, tub)Yes   Patient had injury with falling even though using walker.   If YES, ask if the [facility] PT can contact patient and good time and number?  14)  At the end of your discharge instructions, there is information about accessing [facility], have you had a chance to review those? Not applicable         Do you have any questions about signing up for this service?  No   We encourage our patients to be active participants in their healthcare and this site is one of the ways to do that.  It will allow you to access parts of your medical record, email your doctor???s office, schedule appointments, and request medications refills .    15) Are there any other questions that I can answer for you regarding    your Emergency department visit? NO             Estimated Call Time:_____2:44 PM  ______________ Date/Time:_______________

## 2015-12-29 NOTE — ED Provider Notes (Signed)
HPI Comments: 82 y.o. female with past medical history significant for dementia, CAD, afib, HTN, macular degeneration, and osteoporosis who presents via EMS from home accompanied by her son and daughter-in-law with chief complaint of left wrist pain. Pt states she lost her balance while leaning over to remove her pants tonight. Pt landed on her left wrist, where she is complaining of the most pain. Since arrival, pt has noticed pain to her right ankle as well. Pt received 100 mcg intranasal fentanyl per EMS prior to arrival. Pt is right-hand dominant. Pt specifically denies syncope and leg pain. There are no other acute medical concerns at this time.    Social hx: denies tobacco use; denies EtOH use; denies illicit drug use  PCP: Abe People, MD    Note written by Lanell Matar, Scribe, as dictated by Casimiro Needle, MD 8:32 PM        The history is provided by the patient.        Past Medical History:   Diagnosis Date   ??? CAD (coronary artery disease)    ??? Dementia    ??? Hypertension    ??? Ill-defined condition     a fib   ??? Ill-defined condition     macular degeneration   ??? Ill-defined condition     osteoporosis   ??? Ill-defined condition     GERD   ??? Ill-defined condition     hypercholesterolemia       Past Surgical History:   Procedure Laterality Date   ??? HX ORTHOPAEDIC           History reviewed. No pertinent family history.    Social History     Social History   ??? Marital status: WIDOWED     Spouse name: [name]   ??? Number of children: [name]   ??? Years of education: [name]     Occupational History   ??? Not on file.     Social History Main Topics   ??? Smoking status: Not on file   ??? Smokeless tobacco: Not on file   ??? Alcohol use Not on file   ??? Drug use: Not on file   ??? Sexual activity: Not on file     Other Topics Concern   ??? Not on file     Social History Narrative         ALLERGIES: Codeine    Review of Systems   Constitutional: Negative for activity change, diaphoresis, fatigue and fever.    HENT: Negative for congestion and sore throat.    Eyes: Negative for photophobia and visual disturbance.   Respiratory: Negative for chest tightness and shortness of breath.    Cardiovascular: Negative for chest pain, palpitations and leg swelling.   Gastrointestinal: Negative for abdominal pain, blood in stool, constipation, diarrhea, nausea and vomiting.   Genitourinary: Negative for difficulty urinating, dysuria, flank pain, frequency and hematuria.   Musculoskeletal: Negative for back pain.        Left wrist pain, right ankle pain   Neurological: Negative for dizziness, syncope, numbness and headaches.   All other systems reviewed and are negative.      Vitals:    12/29/15 2016   BP: 172/62   Pulse: 70   Resp: 18   Temp: 98.7 ??F (37.1 ??C)   SpO2: 98%   Weight: 48.1 kg (106 lb)   Height: 5' (1.524 m)            Physical Exam  Constitutional: She is oriented to person, place, and time. She appears well-developed and well-nourished. No distress.   HENT:   Head: Normocephalic and atraumatic.   Nose: Nose normal.   Mouth/Throat: Oropharynx is clear and moist. No oropharyngeal exudate.   Eyes: Conjunctivae and EOM are normal. Right eye exhibits no discharge. Left eye exhibits no discharge. No scleral icterus.   Neck: Normal range of motion. Neck supple. No JVD present. No tracheal deviation present. No thyromegaly present.   Cardiovascular: Normal rate, regular rhythm, normal heart sounds and intact distal pulses.  Exam reveals no gallop and no friction rub.    No murmur heard.  Pulmonary/Chest: Effort normal and breath sounds normal. No stridor. No respiratory distress. She has no wheezes. She has no rales. She exhibits no tenderness.   Abdominal: Bowel sounds are normal. She exhibits no distension and no mass. There is no tenderness. There is no rebound.   Musculoskeletal: Normal range of motion. She exhibits no edema.        Left wrist: She exhibits tenderness, swelling and deformity.         Right ankle: Tenderness. Lateral malleolus (anterior lateral) tenderness found.   Sensation intact.   Lymphadenopathy:     She has no cervical adenopathy.   Neurological: She is alert and oriented to person, place, and time. No cranial nerve deficit. Coordination normal.   Skin: Skin is warm and dry. No rash noted. She is not diaphoretic. No erythema. No pallor.   Psychiatric: She has a normal mood and affect. Her behavior is normal. Judgment and thought content normal.   Nursing note and vitals reviewed.  Note written by Lanell MatarSarah J. Dash, Scribe, as dictated by Casimiro Needleustin W Olsen Mccutchan, MD 8:32 PM     MDM  Number of Diagnoses or Management Options  Other closed intra-articular fracture of distal end of left radius, initial encounter:      Amount and/or Complexity of Data Reviewed  Clinical lab tests: ordered and reviewed  Tests in the radiology section of CPT??: ordered and reviewed  Review and summarize past medical records: yes    Risk of Complications, Morbidity, and/or Mortality  Presenting problems: moderate  Diagnostic procedures: moderate  Management options: moderate      ED Course       Hematoma Block  Date/Time: 12/29/2015 9:28 PM  Performed by: Casimiro NeedleANDERSON, Callista Hoh W  Authorized by: Casimiro NeedleANDERSON, Maridee Slape W     Consent:     Consent obtained:  Verbal    Consent given by:  Patient    Risks discussed:  Bleeding, incomplete drainage, infection, nerve damage, poor cosmetic result and pain    Alternatives discussed:  No treatment  Indications:     Indications:  Acute fractures of the distal radius and ulna  Pre-procedure details:     Preparation: Patient was prepped and draped in the usual sterile fashion    Anesthesia (see MAR for exact dosages):     Anesthesia method:  None  Post-procedure details:     Patient tolerance of procedure:  Tolerated well, no immediate complications  Comments:      2128 - 10 ccs 1% lidocaine injected into left wrist.   2137 - left fingers placed in finger traps. 10 lb weight draped around  elbow. Fracture reduced.  2140 - sugar-tong splint applied.      Reduction of Joint  Date/Time: 12/29/2015 9:36 PM  Performed by: Casimiro NeedleANDERSON, Staisha Winiarski W  Authorized by: Casimiro NeedleANDERSON, Liela Rylee W     Consent:  Consent obtained:  Verbal    Consent given by:  Patient    Risks discussed:  Nerve damage, pain and vascular damage    Alternatives discussed:  Delayed treatment  Injury:     Injury location:  Forearm    Forearm injury location:  L forearm    Forearm fracture type: distal radius and ulnar styloid    Pre-procedure assessment:     Neurological function: normal      Distal perfusion: normal      Range of motion: reduced    Sedation:     Sedation type:  Anxiolysis  Anesthesia (see MAR for exact dosages):     Anesthesia method:  Local infiltration    Local anesthetic:  Lidocaine 1% w/o epi  Procedure details:     Manipulation performed: yes      Skin traction used: yes      Skeletal traction used: yes      Pin inserted: no      Reduction successful: yes      X-ray confirmed reduction: no      Immobilization:  Splint    Splint type:  Sugar tong    Supplies used:  Ortho-Glass, cotton padding and elastic bandage  Post-procedure assessment:     Neurological function: normal      Distal perfusion: normal      Range of motion: improved      Patient tolerance of procedure:  Tolerated well, no immediate complications        CONSULT NOTE:  9:07 PM Casimiro Needle, MD spoke with Idelle Leech, PA-C, Consult for Orthopedics.  Discussed available diagnostic tests and clinical findings.  He is in agreement with care plans as outlined.  Kenyon Ana recommends splinting in the ED with Dr Williams Che f/u out-patient.      6:36 PM  The patient has been reevaluated. The patient is ready for discharge. The patient's signs, symptoms, diagnosis, and discharge instructions have been discussed and the patient/ family has conveyed their understanding. The patient is to follow up as recommended or return to the ED should their  symptoms worsen. Plan has been discussed and the patient is in agreement.    LABORATORY TESTS:  No results found for this or any previous visit (from the past 12 hour(s)).    IMAGING RESULTS:  XR ANKLE RT AP/LAT   Final Result      XR FOREARM LT AP/LAT   Final Result        No results found.      MEDICATIONS GIVEN:  Medications   lidocaine (XYLOCAINE) 10 mg/mL (1 %) injection 10 mL (10 mL IntraDERMal Given by Provider 12/29/15 2124)       IMPRESSION:  1. Other closed intra-articular fracture of distal end of left radius, initial encounter        PLAN:  1.   Discharge Medication List as of 12/29/2015 10:05 PM      CONTINUE these medications which have NOT CHANGED    Details   levoFLOXacin (LEVAQUIN) 750 mg tablet Take 1 Tab by mouth every fourty-eight (48) hours., Print, Disp-2 Tab, R-9      metroNIDAZOLE (FLAGYL) 500 mg tablet Take 1 Tab by mouth three (3) times daily., Print, Disp-14 Tab, R-0      alendronate (FOSAMAX) 70 mg tablet Take 70 mg by mouth every seven (7) days., Historical Med      amitriptyline (ELAVIL) 50 mg tablet Take 50 mg by mouth nightly., Historical Med  atorvastatin (LIPITOR) 40 mg tablet Take 40 mg by mouth daily., Historical Med      carvedilol (COREG) 3.125 mg tablet Take 3.125 mg by mouth two (2) times daily (with meals)., Historical Med      dilTIAZem (CARDIZEM) 120 mg tablet Take 120 mg by mouth daily., Historical Med      apixaban (ELIQUIS) 2.5 mg tablet Take 2.5 mg by mouth two (2) times a day., Historical Med      lisinopril (PRINIVIL, ZESTRIL) 20 mg tablet Take 20 mg by mouth daily., Historical Med      tolterodine (DETROL) 2 mg tablet Take 2 mg by mouth daily., Historical Med           2.   Follow-up Information     Follow up With Details Comments Contact Info    Abe People, MD  If symptoms worsen [address]  [address] [place] [address]  [phone]      [facility] EMERGENCY DEP  If symptoms worsen [address]  [place] [address]  [phone]     Williams Che, MD Call  [address]  [address]  [place] [address]  [phone]            Return to ED for new or worsening symptoms       Casimiro Needle, MD

## 2015-12-30 ENCOUNTER — Inpatient Hospital Stay
Admit: 2015-12-30 | Discharge: 2015-12-30 | Disposition: A | Discharge status: Home / Self Care | Payer: MEDICARE | Attending: Student in an Organized Health Care Education/Training Program

## 2015-12-30 LAB — EKG 12-LEAD
Atrial Rate: 65 {beats}/min
P-R Interval: 216 ms
Q-T Interval: 396 ms
QRS Duration: 72 ms
QTc Calculation (Bazett): 411 ms
R Axis: -31 degrees
T Axis: 33 degrees
Ventricular Rate: 65 {beats}/min

## 2015-12-30 LAB — CBC WITH AUTOMATED DIFF
ABS. BASOPHILS: 0 10*3/uL (ref 0.0–0.1)
ABS. EOSINOPHILS: 0.2 10*3/uL (ref 0.0–0.4)
ABS. LYMPHOCYTES: 0.8 10*3/uL (ref 0.8–3.5)
ABS. MONOCYTES: 0.8 10*3/uL (ref 0.0–1.0)
ABS. NEUTROPHILS: 8 10*3/uL (ref 1.8–8.0)
BASOPHILS: 0 % (ref 0–1)
EOSINOPHILS: 2 % (ref 0–7)
HCT: 38.2 % (ref 35.0–47.0)
HGB: 12.6 g/dL (ref 11.5–16.0)
LYMPHOCYTES: 8 % — ABNORMAL LOW (ref 12–49)
MCH: 29.6 PG (ref 26.0–34.0)
MCHC: 33 g/dL (ref 30.0–36.5)
MCV: 89.9 FL (ref 80.0–99.0)
MONOCYTES: 8 % (ref 5–13)
NEUTROPHILS: 82 % — ABNORMAL HIGH (ref 32–75)
PLATELET: 248 10*3/uL (ref 150–400)
RBC: 4.25 M/uL (ref 3.80–5.20)
RDW: 14 % (ref 11.5–14.5)
WBC: 9.8 10*3/uL (ref 3.6–11.0)

## 2015-12-30 LAB — EKG, 12 LEAD, INITIAL
Atrial Rate: 65 {beats}/min
Calculated R Axis: -31 degrees
Calculated T Axis: 33 degrees
P-R Interval: 216 ms
Q-T Interval: 396 ms
QRS Duration: 72 ms
QTC Calculation (Bezet): 411 ms
Ventricular Rate: 65 {beats}/min

## 2015-12-30 LAB — METABOLIC PANEL, COMPREHENSIVE
A-G Ratio: 0.7 — ABNORMAL LOW (ref 1.1–2.2)
ALT (SGPT): 15 U/L (ref 12–78)
AST (SGOT): 10 U/L — ABNORMAL LOW (ref 15–37)
Albumin: 3 g/dL — ABNORMAL LOW (ref 3.5–5.0)
Alk. phosphatase: 156 U/L — ABNORMAL HIGH (ref 45–117)
Anion gap: 8 mmol/L (ref 5–15)
BUN/Creatinine ratio: 19 (ref 12–20)
BUN: 16 MG/DL (ref 6–20)
Bilirubin, total: 0.3 MG/DL (ref 0.2–1.0)
CO2: 24 mmol/L (ref 21–32)
Calcium: 8.8 MG/DL (ref 8.5–10.1)
Chloride: 106 mmol/L (ref 97–108)
Creatinine: 0.85 MG/DL (ref 0.55–1.02)
GFR est AA: >60 mL/min/{1.73_m2} (ref >60)
GFR est non-AA: >60 mL/min/{1.73_m2} (ref >60)
Globulin: 4.1 g/dL — ABNORMAL HIGH (ref 2.0–4.0)
Glucose: 149 mg/dL — ABNORMAL HIGH (ref 65–100)
Potassium: 4.2 mmol/L (ref 3.5–5.1)
Protein, total: 7.1 g/dL (ref 6.4–8.2)
Sodium: 138 mmol/L (ref 136–145)

## 2015-12-30 MED ORDER — LIDOCAINE HCL 1 % (10 MG/ML) IJ SOLN
10 mg/mL (1 %) | Freq: Once | INTRAMUSCULAR | Status: AC
Start: 2015-12-30 — End: 2015-12-29
  Administered 2015-12-30: 01:00:00 via INTRADERMAL

## 2015-12-30 MED ORDER — OXYCODONE-ACETAMINOPHEN 5 MG-325 MG TAB
5-325 mg | ORAL_TABLET | Freq: Four times a day (QID) | ORAL | 0 refills | Status: AC | PRN
Start: 2015-12-30 — End: 2016-01-01

## 2015-12-30 MED FILL — LIDOCAINE HCL 1 % (10 MG/ML) IJ SOLN: 10 mg/mL (1 %) | INTRAMUSCULAR | Qty: 20

## 2016-10-06 ENCOUNTER — Encounter

## 2016-10-09 ENCOUNTER — Encounter

## 2016-10-10 ENCOUNTER — Ambulatory Visit

## 2016-10-10 ENCOUNTER — Inpatient Hospital Stay: Admit: 2016-10-10 | Discharge: 2016-10-10 | Payer: MEDICARE | Primary: Family Medicine

## 2016-10-10 ENCOUNTER — Encounter

## 2016-10-10 ENCOUNTER — Inpatient Hospital Stay: Payer: MEDICARE | Primary: Family Medicine

## 2016-10-10 DIAGNOSIS — R519 Headache, unspecified: Secondary | ICD-10-CM

## 2016-10-10 MED ORDER — SODIUM CHLORIDE 0.9 % IJ SYRG
Freq: Once | INTRAMUSCULAR | Status: AC
Start: 2016-10-10 — End: 2016-10-10
  Administered 2016-10-10: 16:00:00 via INTRAVENOUS

## 2016-10-10 MED ORDER — IOPAMIDOL 61 % IV SOLN
300 mg iodine /mL (61 %) | Freq: Once | INTRAVENOUS | Status: AC
Start: 2016-10-10 — End: 2016-10-10
  Administered 2016-10-10: 16:00:00 via INTRAVENOUS

## 2016-10-10 MED FILL — NORMAL SALINE FLUSH 0.9 % INJECTION SYRINGE: INTRAMUSCULAR | Qty: 10

## 2016-10-10 MED FILL — ISOVUE-300  61 % INTRAVENOUS SOLUTION: 300 mg iodine /mL (61 %) | INTRAVENOUS | Qty: 100

## 2016-12-04 IMAGING — CT CT HEAD W/O CM
4 of 5 series · 17 of 47 positions shown, 18 images · non-contrast
Comparison: Brain MR of 12/09/2014. Head and C-spine CT of
05/31/2014.

CLINICAL DATA: Reported falls.  Increased confusion.

EXAM:
CT HEAD WITHOUT CONTRAST
CT CERVICAL SPINE WITHOUT CONTRAST
TECHNIQUE: Multidetector CT imaging of the head and cervical spine was
performed following the standard protocol without intravenous
contrast. Multiplanar CT image reconstructions of the cervical spine
were also generated.

[Series 3: headseq 4.8 h45s · axial · 0.43mm/px · z∈[+1082,+1169]mm · 4 of 30 slices shown, 5 images]
[im 6/30  brain]
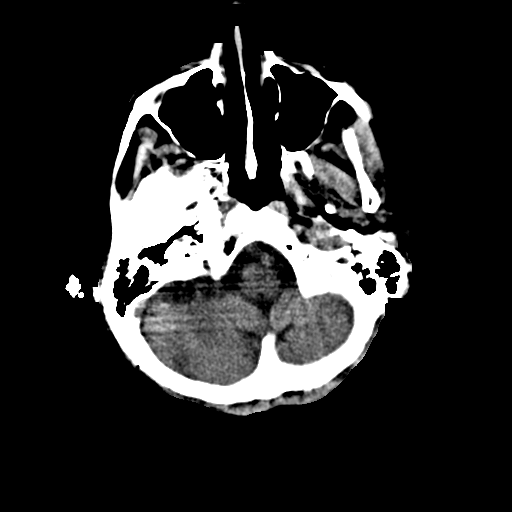
[im 6/30  bone]
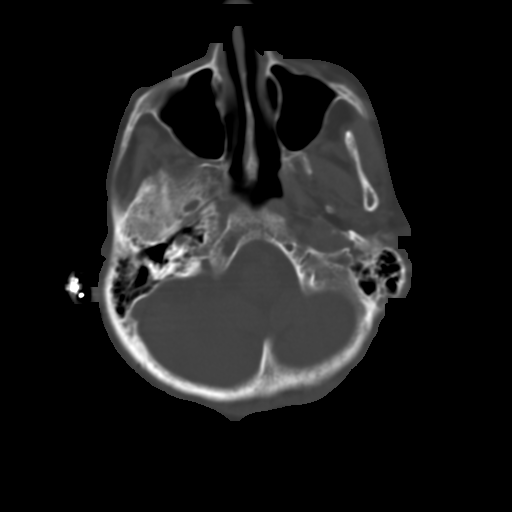
[im 12/30  brain]
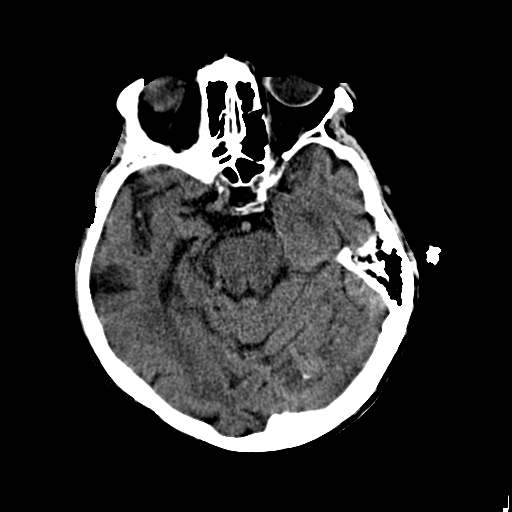
[im 18/30  brain]
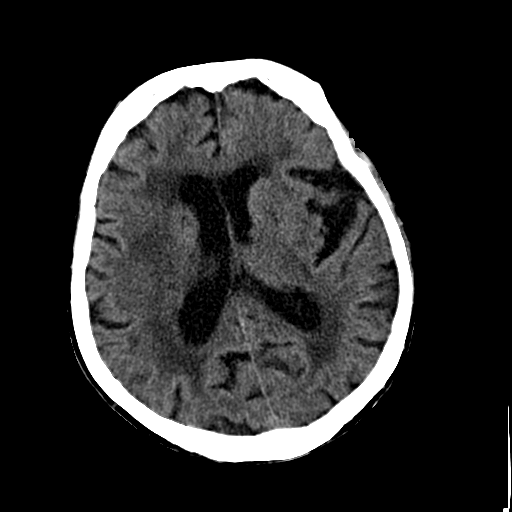
[im 24/30  brain]
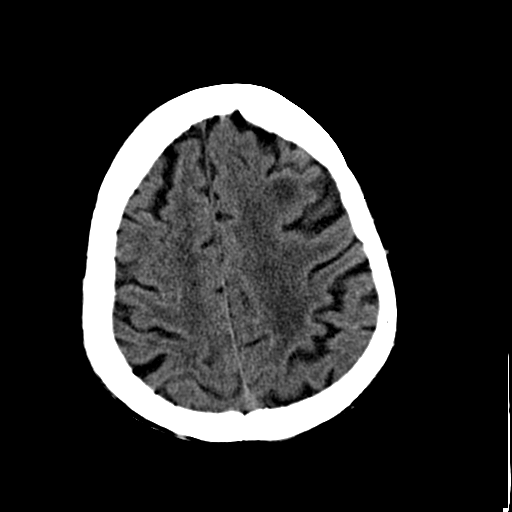

[Series 602: cerv axial upper · axial · 0.32mm/px · z∈[+989,+1063]mm · 7 of 49 slices shown]
[im 7/49  brain]
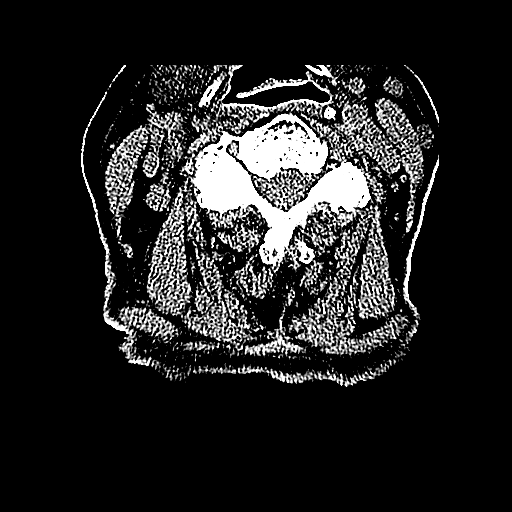
[im 13/49  brain]
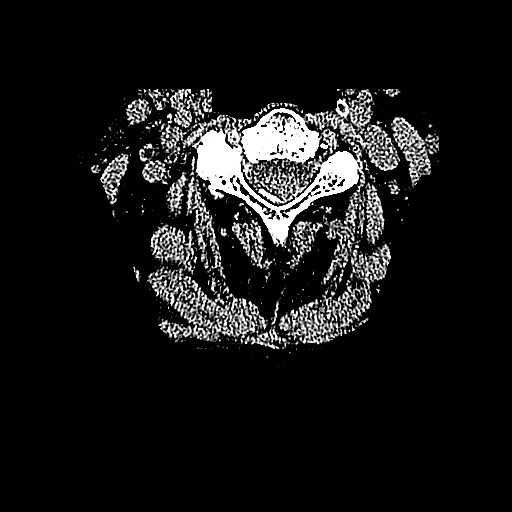
[im 19/49  brain]
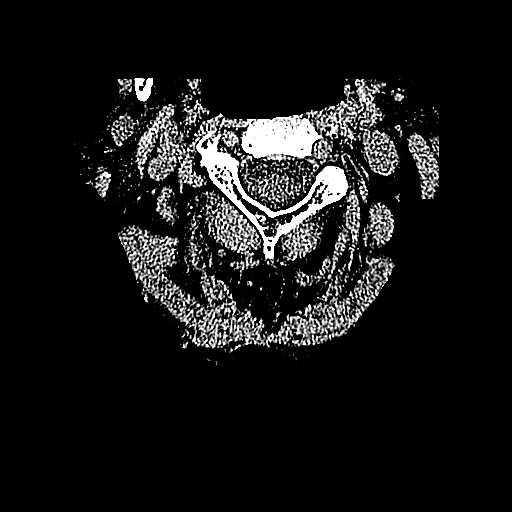
[im 25/49  brain]
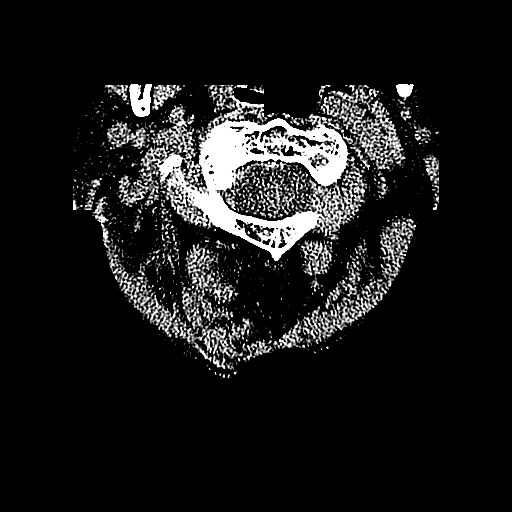
[im 31/49  brain]
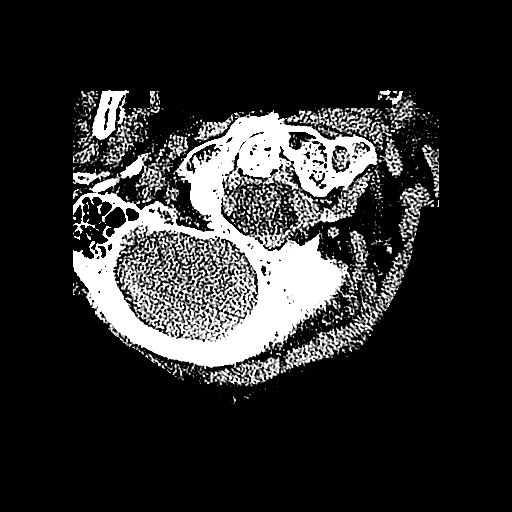
[im 37/49  brain]
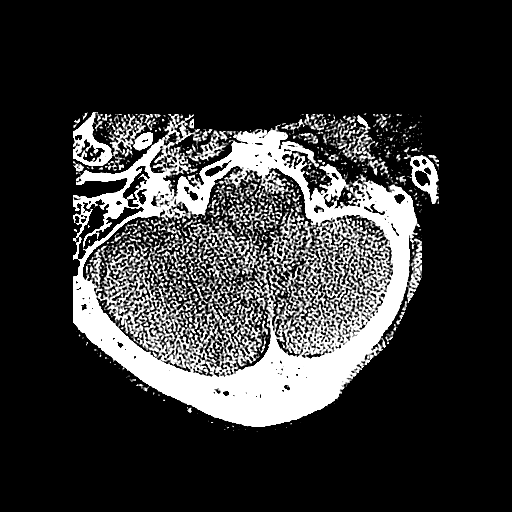
[im 43/49  brain]
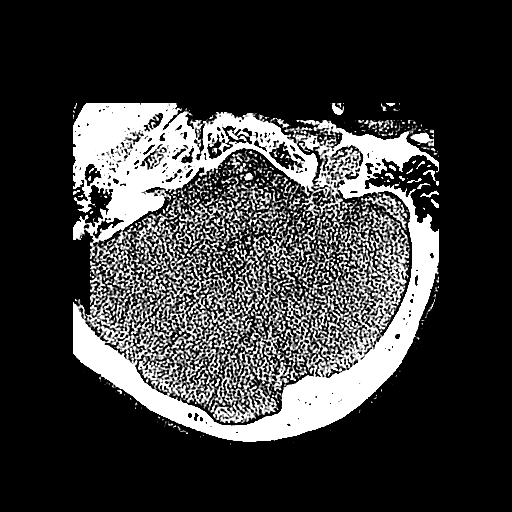

[Series 604: cerv coronal · coronal · 0.32mm/px · 3 of 43 slices shown]
[im 15/43  brain]
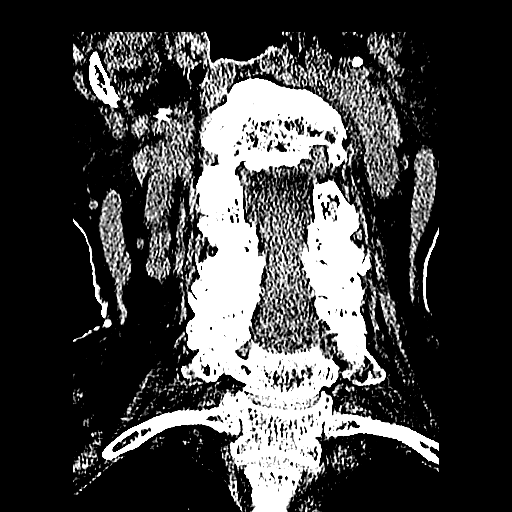
[im 19/43  brain]
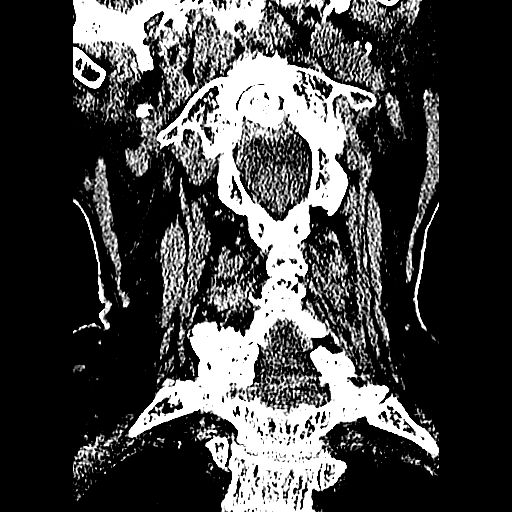
[im 24/43  brain]
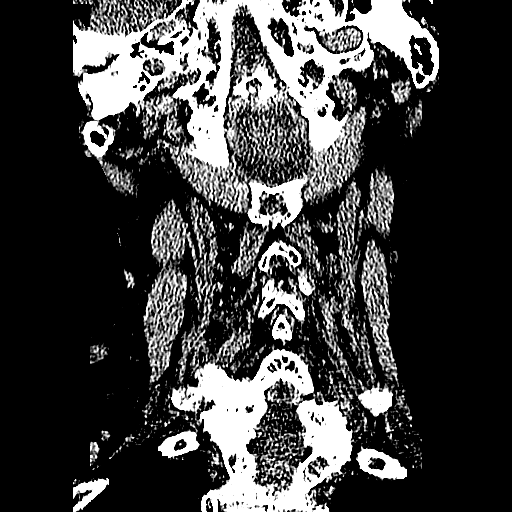

[Series 605: cerv sagittal · sagittal · 0.32mm/px · 3 of 51 slices shown]
[im 17/51  brain]
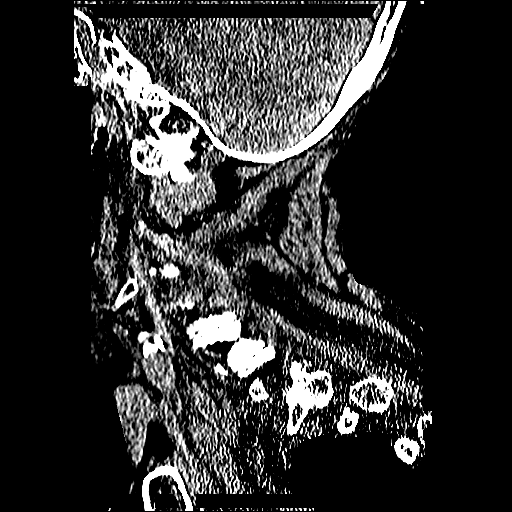
[im 26/51  brain]
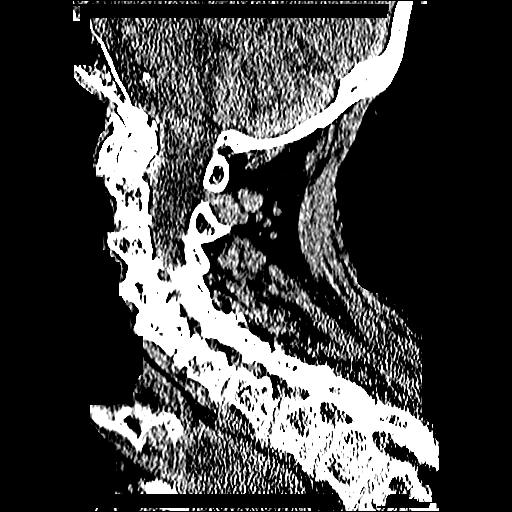
[im 34/51  brain]
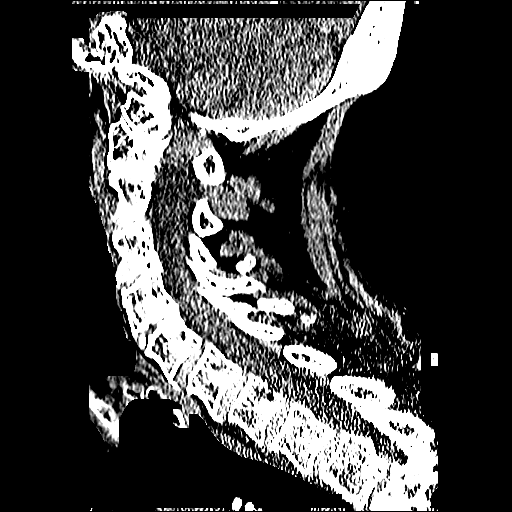

[17 of 47 positions shown; findings below may reference images not displayed]

FINDINGS: CT HEAD FINDINGS

Sinuses/Soft tissues: Minimal mucosal thickening of left ethmoid air
cells. Minimal motion degradation. No significant soft tissue
swelling. No skull fracture. Clear mastoid air cells.

Intracranial: Marked low density in the periventricular white matter
likely related to small vessel disease. Expected cerebral volume
loss for age. Ventriculomegaly which is felt to be secondary. No
mass lesion, hemorrhage, hydrocephalus, acute infarct, intra-axial,
or extra-axial fluid collection.

CT CERVICAL SPINE FINDINGS

Spinal visualization through the bottom of T1. Prevertebral soft
tissues are within normal limits. Re- demonstration of a right sided
thyroid mass on the order of 3.8 cm. No apical pneumothorax. Mild
motion degradation at the level of C1-2. Spondylosis, including
degenerate disc disease at C4-5 and disc osteophyte complex. Facet
arthropathy at multiple levels..
IMPRESSION: 1.  No acute intracranial abnormality.
2. Marked small vessel ischemic change.
3. Cervical spondylosis, without acute fracture or subluxation.
4. Mild motion degradation involving the head and cervical spine
(especially at C1-2).

## 2016-12-25 IMAGING — XA IR KYPHO VERTEBRAL LUMBAR AUGMENTATION
1 series · 12 of 12 positions shown · non-contrast
Comparison: none

CLINICAL DATA: L2 compression fracture. Afebrile. No evidence of
leukocytosis. Negative blood culture.

[Series 300: ir kypho lumbar inc fx reduce bone bx un · 12 of 12 slices shown]
[im 1/12]
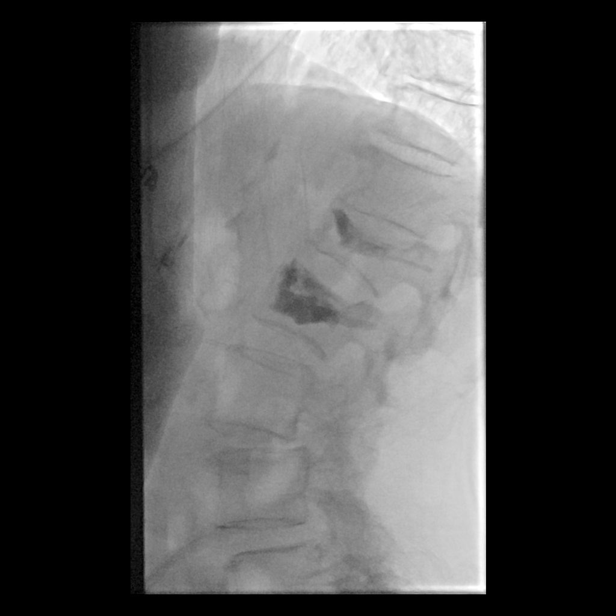
[im 2/12]
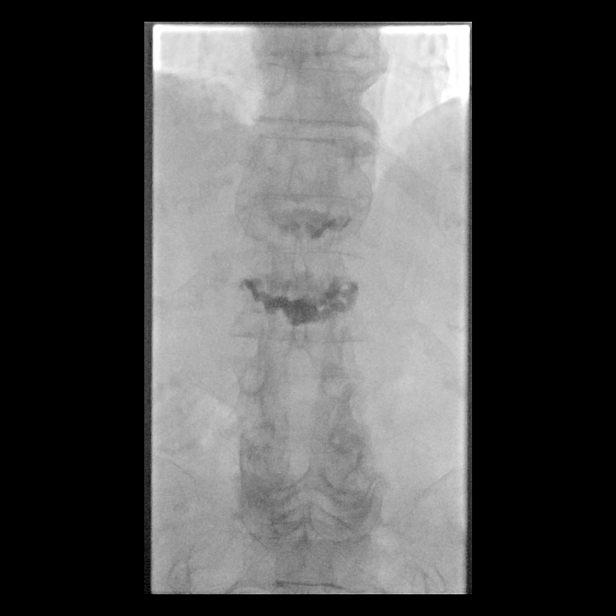
[im 3/12]
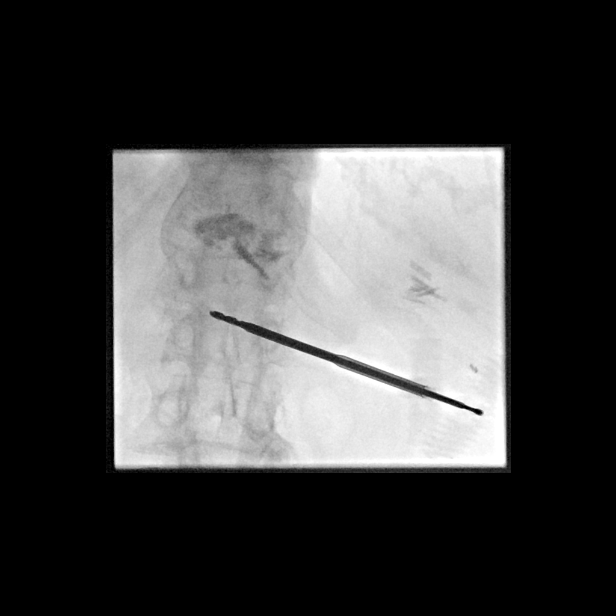
[im 4/12]
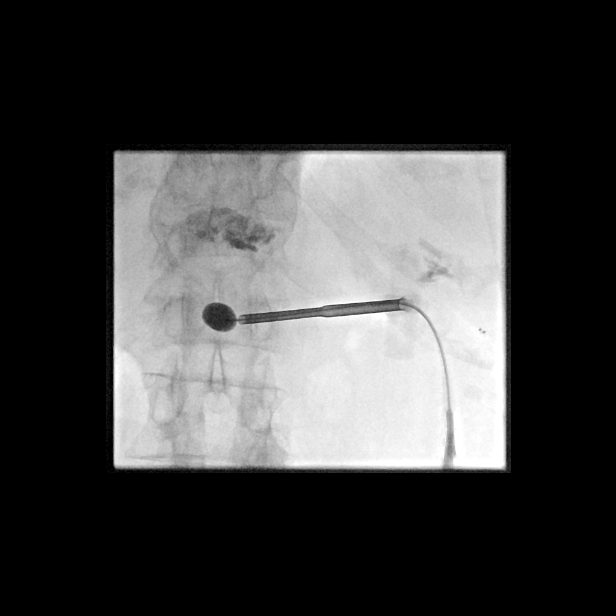
[im 5/12]
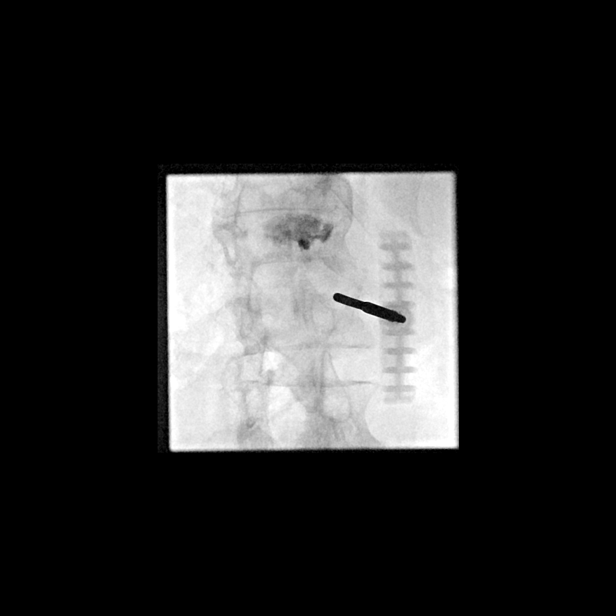
[im 6/12]
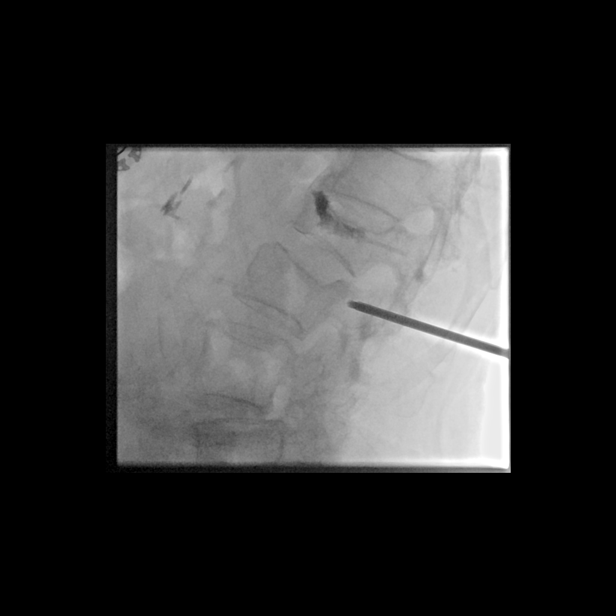
[im 7/12]
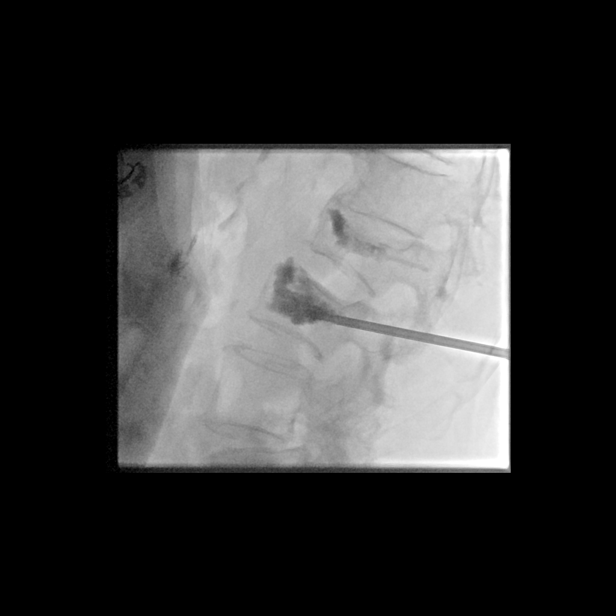
[im 8/12]
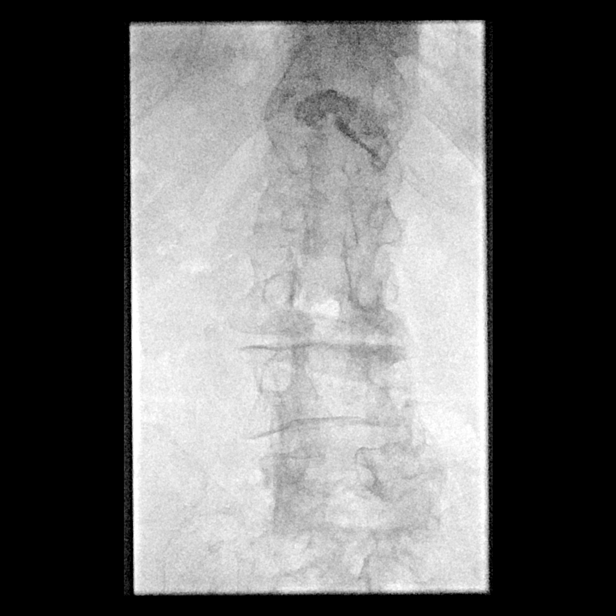
[im 9/12]
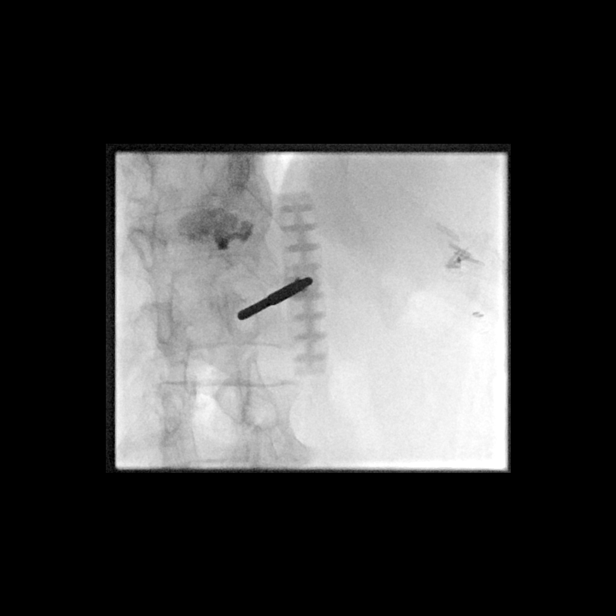
[im 10/12]
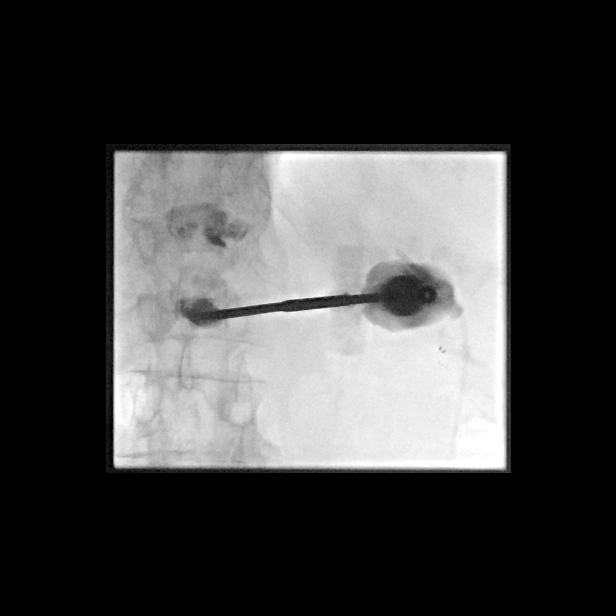
[im 11/12]
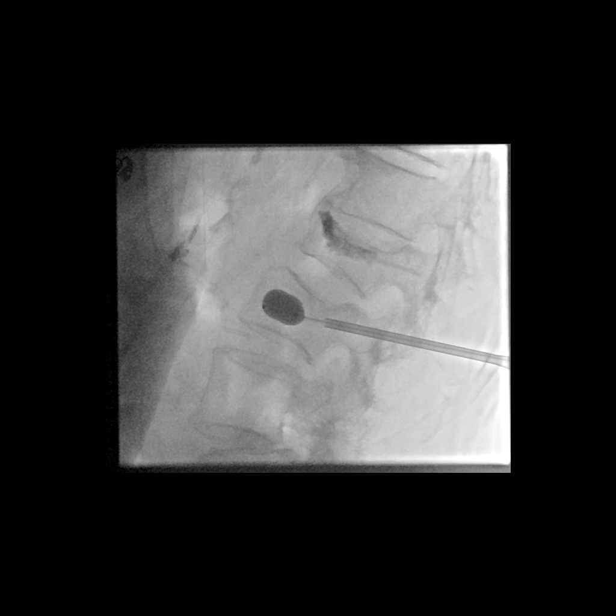
[im 12/12]
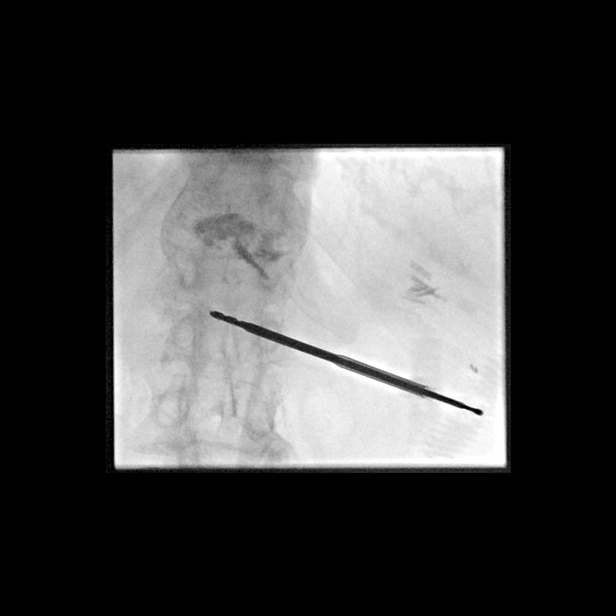

[12 of 12 positions shown; findings below may reference images not displayed]

EXAM:
IR KYPHO VERTEBRAL LUMBAR AUGMENTATION

FLUOROSCOPY TIME:  6 minutes and 48 seconds

MEDICATIONS AND MEDICAL HISTORY:
Versed 5 mg, Fentanyl 100 mcg.

Additional Medications: .

ANESTHESIA/SEDATION:
Moderate sedation time: 30 minutes

CONTRAST:  None

PROCEDURE:
The procedure, risks, benefits, and alternatives were explained to
the patient. Questions regarding the procedure were encouraged and
answered. The patient understands and consents to the procedure.

The back was prepped with Betadine in a sterile fashion, and a
sterile drape was applied covering the operative field. A sterile
gown and sterile gloves were used for the procedure.

1% lidocaine was utilized for local anesthesia. Under fluoroscopic
guidance, T10 gauge expressed needle was advanced into the L2
vertebral body at via right pedicle approach. A drill was advanced
into the vertebral body. The 15 2 balloon was then advanced into the
vertebral body and insufflated to 100 PSI. The balloon was removed
and cement was injected utilizing the deposition trocar and cement
delivery system. Contrast filled the cavity as well as the vertebral
body, crossing the midline, and extending into the superior
endplate. The cement trocar and outer expressed needle within
removed.
FINDINGS: Images document L2 kyphoplasty via right pedicle pressure. Cement
fills the vertebral body, crossing the midline, and extending from
the inferior to superior endplate. There is no extraosseous cement.

COMPLICATIONS:
None
IMPRESSION: Successful L2 kyphoplasty.

## 2016-12-25 IMAGING — DX DG CHEST 1V PORT
1 series · 1 of 1 positions shown · non-contrast
Comparison: 06/17/2015

CLINICAL DATA: Atrial fibrillation.

EXAM:
PORTABLE CHEST 1 VIEW

[chest ap]
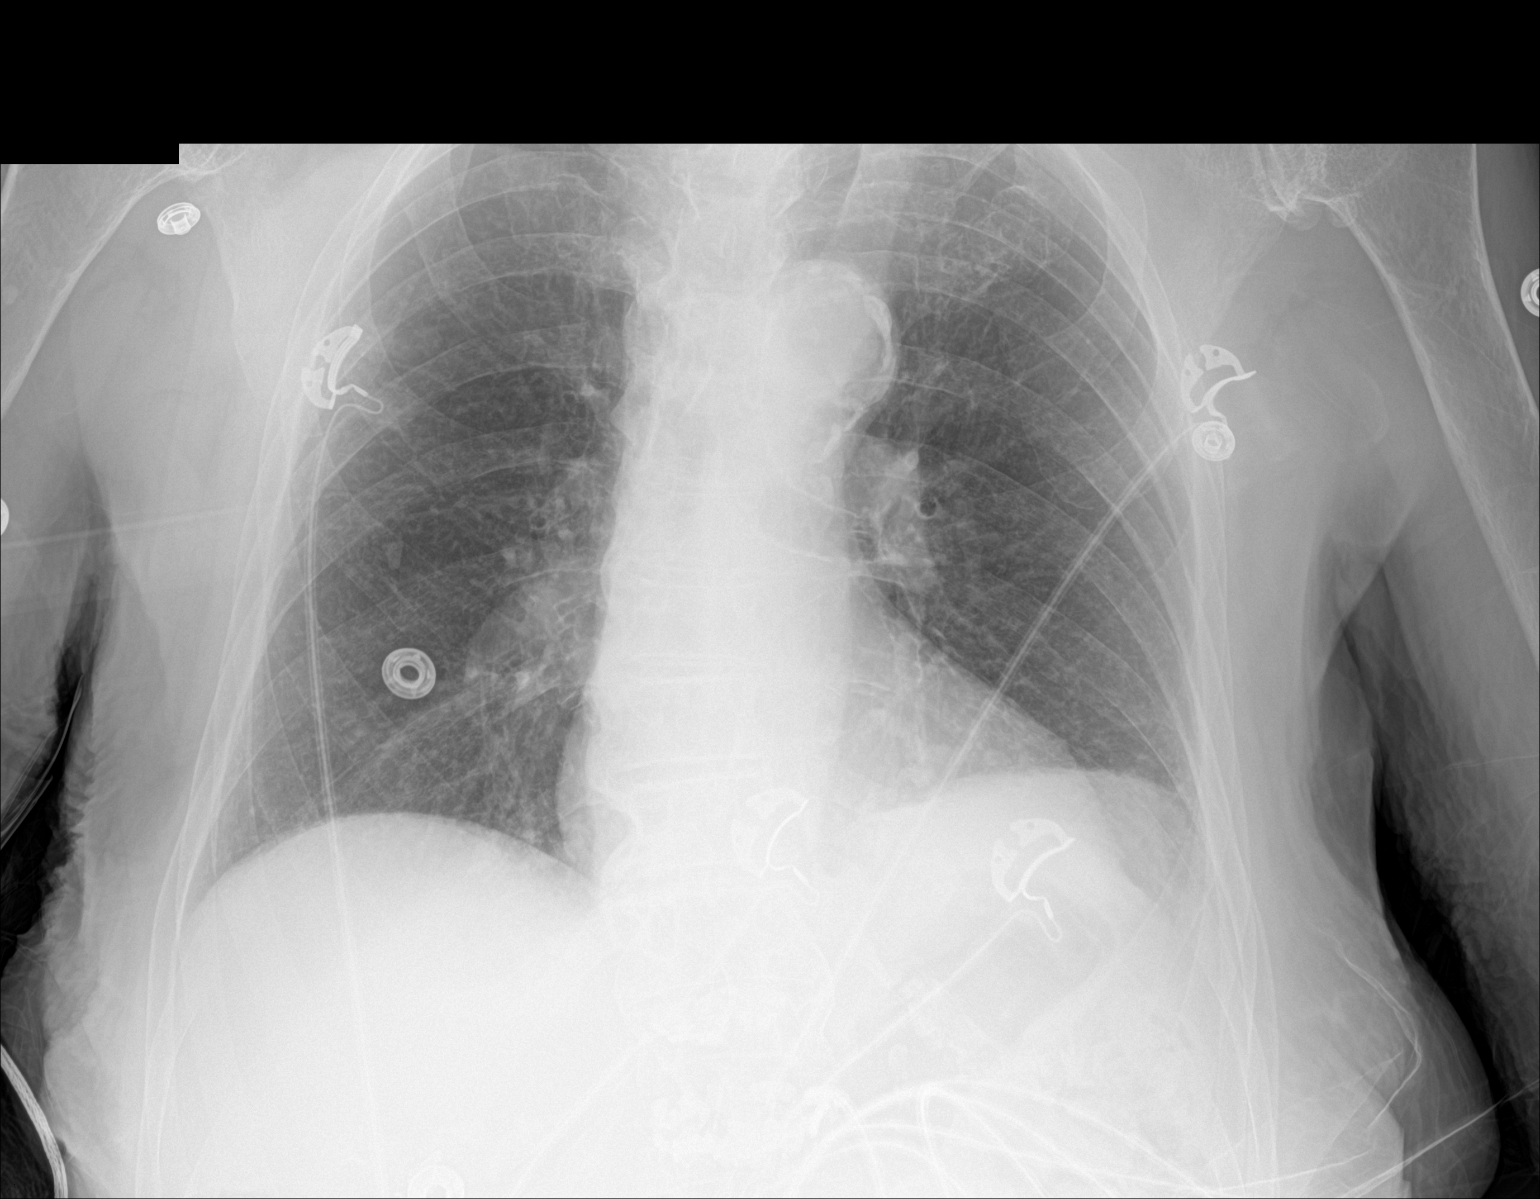

[1 of 1 positions shown; findings below may reference images not displayed]

FINDINGS: Heart size and pulmonary vascularity are normal and the lungs are
clear. Calcification in the arch of the aorta. No acute osseous
abnormalities.
IMPRESSION: No acute disease.  Aortic atherosclerosis.

## 2017-02-23 ENCOUNTER — Encounter

## 2017-02-28 ENCOUNTER — Inpatient Hospital Stay: Admit: 2017-02-28 | Payer: MEDICARE | Attending: Vascular & Interventional Radiology | Primary: Family Medicine

## 2017-02-28 DIAGNOSIS — R1310 Dysphagia, unspecified: Secondary | ICD-10-CM

## 2017-02-28 NOTE — Progress Notes (Signed)
[facility]  [address]  [place], [place]  [address]    Speech Pathology Modified barium swallow Study with cms g codes  Patient: Kimberly [name] (83 y.o. female)  Date: 02/28/2017  Referring Provider:  Stevie Kern, A    SUBJECTIVE:   Patient's son reported that patient has had increasing solid dysphagia with oral holding of foods, excessive chewing and expectoration of solids.  She was downgraded to purees about 2 weeks ago.      OBJECTIVE:   Past Medical History:   Past Medical History:   Diagnosis Date   ??? CAD (coronary artery disease)    ??? Dementia    ??? Hypertension    ??? Ill-defined condition     a fib   ??? Ill-defined condition     macular degeneration   ??? Ill-defined condition     osteoporosis   ??? Ill-defined condition     GERD   ??? Ill-defined condition     hypercholesterolemia     Past Surgical History:   Procedure Laterality Date   ??? HX ORTHOPAEDIC       Current Dietary Status:  Dysphagia 1, thins  Radiologist: Donia Ast  Film Views: Lateral  Patient Position: upright in hausted chair    Trial 1: Trial 2:   Consistency Presented: Solid;Puree;Thin liquid     How Presented: Self-fed/presented;SLP-fed/presented;Spoon;Straw      ORAL PHASE:      Bolus Acceptance: Impaired: severe anxiety and refusal as soon as she arrived at x-ray "I don't want to to do this:    She refused with lip clenching, expectoration     Bolus Formation/Control: oral holding in frontal sulcus with thins, purees. Solids and purees mostly expectorated. She took such tiny sips of liquids most of the time that assessment was difficult.  : Anterior;Posterior propulsion involved some tongue pumping.   :     Propulsion: Delayed (# of seconds);Tongue pumping     Oral Residue:  (frontal )pattient expectorated most of solids and purees.   PHARYNGEAL PHASE:      Initiation of Swallow: Triggered at vallecula with thins     Timing: Pooling 1-5 sec      Penetration: Trace;Before swallow;Flash/transient;During swallow (wiht thins).  This cleared with force of swallow. She took 2 sips that could be viewed on x-ray. 1 was [facility], but she had resultant gagging. The second one showed penetration with clearance with the force of the swallow, but no coughing or gagging.      Aspiration/Timing: No evidence of aspiration     Pharyngeal Clearance: No residue                               Trial 3: Trial 4:                            :    :  In compliance with CMS???s Claims Based Outcome Reporting, the following G-code set was chosen for this patient based the use of the NOMS functional outcome to quantify this patient's level of swallowing impairment.    Using the NOMS, the patient was determined to be at level 3 for swallow function which correlates with the CL= 60-79% level of severity.    Based on the objective assessment provided within this note, the current, goal, and discharge g-codes are as follows:    Swallow  Swallowing:  G8996 Swallow Current Status CL= 60-79%  G8997 Swallow Goal Status CL= 60-79%  G8998 Swallow D/C Status CL= 60-79%      NOMS Swallowing Levels:  Level 1 (CN): NPO  Level 2 (CM): NPO but takes consistency in therapy  Level 3 (CL): Takes less than 50% of nutrition p.o. and continues with nonoral feedings; and/or safe with mod cues; and/or max diet restriction  Level 4 (CK): Safe swallow but needs mod cues; and/or mod diet restriction; and/or still requires some nonoral feeding/supplements  Level 5 (CJ): Safe swallow with min diet restriction; and/or needs min cues  Level 6 (CI): Independent with p.o.; rare cues; usually self cues; may need to avoid some foods or needs extra time  Level 7 (CH): Independent for all p.o.  ASHA. (2003). National Outcomes Measurement System (NOMS): Adult Speech-Language Pathology User's Guide.       ASSESSMENT :   Based on the objective data described above, the patient's MBS results were very limited due to severe anxiety and refusal to participate. She expectorated frequently during eval.  Mild delay in swallow but fairly good protection of airway. Over sensitive to PO with oral defensiveness, gagging and spitting. Noted her dentures were loose.    Suspect this patient's dysphagia is mostly cognitive, but may increase with time due to dementia. Marland Kitchen.     PLAN/RECOMMENDATIONS :  Defer to primary SLP, as decisions should most likely be made based on bedside observations, as this patient's confusion and fear made limited MBS results.   Could consider minced/chopped diet with pureed and liquid supplements.    Ok for thins  Evaluate for oral thrush after oral care post MBS completed./      COMMUNICATION/EDUCATION:   The above findings and recommendations were discussed with: patient's son who verbalized understanding.    Thank you for this referral.  Crist Infanteenise G Bernice Mullin, SLP  Time Calculation: 25 mins

## 2018-02-17 DEATH — deceased
